# Patient Record
Sex: Female | Born: 1955 | Race: White | Hispanic: No | Smoking: Former smoker
Health system: Southern US, Community
[De-identification: ages and names within clinical notes are randomized; demographics above are authoritative.]

## PROBLEM LIST (undated history)

## (undated) DIAGNOSIS — Z860100 Personal history of colon polyps, unspecified: Secondary | ICD-10-CM

## (undated) DIAGNOSIS — G4730 Sleep apnea, unspecified: Secondary | ICD-10-CM

## (undated) DIAGNOSIS — Z9889 Other specified postprocedural states: Secondary | ICD-10-CM

## (undated) DIAGNOSIS — Z780 Asymptomatic menopausal state: Secondary | ICD-10-CM

## (undated) DIAGNOSIS — R42 Dizziness and giddiness: Secondary | ICD-10-CM

## (undated) DIAGNOSIS — M1990 Unspecified osteoarthritis, unspecified site: Secondary | ICD-10-CM

## (undated) DIAGNOSIS — Z8619 Personal history of other infectious and parasitic diseases: Secondary | ICD-10-CM

## (undated) DIAGNOSIS — C539 Malignant neoplasm of cervix uteri, unspecified: Secondary | ICD-10-CM

## (undated) DIAGNOSIS — I10 Essential (primary) hypertension: Secondary | ICD-10-CM

## (undated) DIAGNOSIS — Z9109 Other allergy status, other than to drugs and biological substances: Secondary | ICD-10-CM

## (undated) DIAGNOSIS — R112 Nausea with vomiting, unspecified: Secondary | ICD-10-CM

## (undated) DIAGNOSIS — I639 Cerebral infarction, unspecified: Secondary | ICD-10-CM

## (undated) DIAGNOSIS — G43909 Migraine, unspecified, not intractable, without status migrainosus: Secondary | ICD-10-CM

## (undated) DIAGNOSIS — K5790 Diverticulosis of intestine, part unspecified, without perforation or abscess without bleeding: Secondary | ICD-10-CM

## (undated) DIAGNOSIS — I671 Cerebral aneurysm, nonruptured: Secondary | ICD-10-CM

## (undated) HISTORY — DX: Unspecified osteoarthritis, unspecified site: M19.90

## (undated) HISTORY — PX: OTHER SURGICAL HISTORY: SHX169

## (undated) HISTORY — PX: KNEE ARTHROSCOPY: SHX127

## (undated) HISTORY — DX: Cerebral infarction, unspecified: I63.9

## (undated) HISTORY — DX: Asymptomatic menopausal state: Z78.0

## (undated) HISTORY — DX: Other allergy status, other than to drugs and biological substances: Z91.09

## (undated) HISTORY — DX: Malignant neoplasm of cervix uteri, unspecified: C53.9

## (undated) HISTORY — DX: Personal history of colonic polyps: Z86.010

## (undated) HISTORY — DX: Essential (primary) hypertension: I10

## (undated) HISTORY — DX: Cerebral aneurysm, nonruptured: I67.1

## (undated) HISTORY — DX: Diverticulosis of intestine, part unspecified, without perforation or abscess without bleeding: K57.90

## (undated) HISTORY — DX: Personal history of colon polyps, unspecified: Z86.0100

---

## 1986-07-09 HISTORY — PX: BREAST BIOPSY: SHX20

## 1991-07-10 HISTORY — PX: BREAST BIOPSY: SHX20

## 2006-05-28 ENCOUNTER — Encounter: Admission: RE | Admit: 2006-05-28 | Discharge: 2006-05-28 | Payer: Self-pay | Admitting: Specialist

## 2008-11-22 ENCOUNTER — Ambulatory Visit: Payer: Self-pay | Admitting: Internal Medicine

## 2008-11-22 DIAGNOSIS — J3089 Other allergic rhinitis: Secondary | ICD-10-CM

## 2008-11-22 DIAGNOSIS — J302 Other seasonal allergic rhinitis: Secondary | ICD-10-CM | POA: Insufficient documentation

## 2009-05-02 ENCOUNTER — Encounter: Admission: RE | Admit: 2009-05-02 | Discharge: 2009-05-02 | Payer: Self-pay | Admitting: Specialist

## 2010-05-05 ENCOUNTER — Encounter: Admission: RE | Admit: 2010-05-05 | Discharge: 2010-05-05 | Payer: Self-pay | Admitting: Specialist

## 2010-07-09 LAB — HM COLONOSCOPY: HM Colonoscopy: NORMAL

## 2010-07-13 ENCOUNTER — Encounter (INDEPENDENT_AMBULATORY_CARE_PROVIDER_SITE_OTHER): Payer: Self-pay | Admitting: *Deleted

## 2010-07-17 ENCOUNTER — Ambulatory Visit
Admission: RE | Admit: 2010-07-17 | Discharge: 2010-07-17 | Payer: Self-pay | Source: Home / Self Care | Attending: Internal Medicine | Admitting: Internal Medicine

## 2010-07-28 ENCOUNTER — Ambulatory Visit
Admission: RE | Admit: 2010-07-28 | Discharge: 2010-07-28 | Payer: Self-pay | Source: Home / Self Care | Attending: Internal Medicine | Admitting: Internal Medicine

## 2010-07-28 ENCOUNTER — Other Ambulatory Visit: Payer: Self-pay | Admitting: Internal Medicine

## 2010-08-03 ENCOUNTER — Encounter: Payer: Self-pay | Admitting: Internal Medicine

## 2010-08-10 NOTE — Letter (Signed)
Summary: Miralax Instructions  [facility]  [address]   [place], [place] [address]   Phone: [phone]  Fax: [phone]       Shannon Yu    July 24, 1955    MRN: [id number]       Procedure Day Dorna Bloom: Friday, 07-28-10     Arrival Time: 9:00 a.m.     Procedure Time: 10:00 a.m.     Location of Procedure:                    x   [facility] (4th Floor)    PREPARATION FOR COLONOSCOPY WITH MIRALAX  Starting 5 days prior to your procedure 07-23-10 do not eat nuts, seeds, popcorn, corn, beans, peas,  salads, or any raw vegetables.  Do not take any fiber supplements (e.g. Metamucil, Citrucel, and Benefiber). ____________________________________________________________________________________________________   THE DAY BEFORE YOUR PROCEDURE         DATE:07-27-10  DAY: Thursday  1   Drink clear liquids the entire day-NO SOLID FOOD  2   Do not drink anything colored red or purple.  Avoid juices with pulp.  No orange juice.  3   Drink at least 64 oz. (8 glasses) of fluid/clear liquids during the day to prevent dehydration and help the prep work efficiently.  CLEAR LIQUIDS INCLUDE: Water Jello Ice Popsicles Tea (sugar ok, no milk/cream) Powdered fruit flavored drinks Coffee (sugar ok, no milk/cream) Gatorade Juice: apple, white grape, white cranberry  Lemonade Clear bullion, consomm, broth Carbonated beverages (any kind) Strained chicken noodle soup Hard Candy  4   Mix the entire bottle of Miralax with 64 oz. of Gatorade/Powerade in the morning and put in the refrigerator to chill.  5   At 3:00 pm take 2 Dulcolax/Bisacodyl tablets.  6   At 4:30 pm take one Reglan/Metoclopramide tablet.  7  Starting at 5:00 pm drink one 8 oz glass of the Miralax mixture every 15-20 minutes until you have finished drinking the entire 64 oz.  You should finish drinking prep around 7:30 or 8:00 pm.  8   If you are nauseated, you may take the 2nd Reglan/Metoclopramide  tablet at 6:30 pm.        9    At 8:00 pm take 2 more DULCOLAX/Bisacodyl tablets.     THE DAY OF YOUR PROCEDURE      DATE:  07-28-10 DAY: Friday  You may drink clear liquids until  8:00 a.m.  (2 HOURS BEFORE PROCEDURE).   MEDICATION INSTRUCTIONS  Unless otherwise instructed, you should take regular prescription medications with a small sip of water as early as possible the morning of your procedure.         OTHER INSTRUCTIONS  You will need a responsible adult at least 54 years of age to accompany you and drive you home.   This person must remain in the waiting room during your procedure.  Wear loose fitting clothing that is easily removed.  Leave jewelry and other valuables at home.  However, you may wish to bring a book to read or an iPod/MP3 player to listen to music as you wait for your procedure to start.  Remove all body piercing jewelry and leave at home.  Total time from sign-in until discharge is approximately 2-3 hours.  You should go home directly after your procedure and rest.  You can resume normal activities the day after your procedure.  The day of your procedure you should not:   Drive  Make legal decisions   Operate machinery   Drink alcohol   Return to work  You will receive specific instructions about eating, activities and medications before you leave.   The above instructions have been reviewed and explained to me by   Ezra Sites RN  July 17, 2010 2:56 PM    I fully understand and can verbalize these instructions _____________________________ Date _______

## 2010-08-10 NOTE — Miscellaneous (Signed)
Summary: [facility]  Clinical Lists Changes  Medications: Added new medication of MIRALAX   POWD (POLYETHYLENE GLYCOL 3350) As per prep  instructions. - Signed Added new medication of DULCOLAX 5 MG  TBEC (BISACODYL) Day before procedure take 2 at 3pm and 2 at 8pm. - Signed Added new medication of REGLAN 10 MG  TABS (METOCLOPRAMIDE HCL) As per prep instructions. - Signed Rx of MIRALAX   POWD (POLYETHYLENE GLYCOL 3350) As per prep  instructions.;  #255gm x 0;  Signed;  Entered by: Ezra Sites RN;  Authorized by: Hart Carwin MD;  Method used: Electronically to [facility]  [address] # [id number]*, [address] [place], [place], [place]  [address], Ph: [phone] or [phone], Fax: [phone] Rx of DULCOLAX 5 MG  TBEC (BISACODYL) Day before procedure take 2 at 3pm and 2 at 8pm.;  #4 x 0;  Signed;  Entered by: Ezra Sites RN;  Authorized by: Hart Carwin MD;  Method used: Electronically to [facility]  [address] # [id number]*, [address] [place], [place], [place]  [address], Ph: [phone] or [phone], Fax: [phone] Rx of REGLAN 10 MG  TABS (METOCLOPRAMIDE HCL) As per prep instructions.;  #2 x 0;  Signed;  Entered by: Ezra Sites RN;  Authorized by: Hart Carwin MD;  Method used: Electronically to [facility]  [address] # [id number]*, [address] [place], [place], [place]  [address], Ph: [phone] or [phone], Fax: [phone]    Prescriptions: REGLAN 10 MG  TABS (METOCLOPRAMIDE HCL) As per prep instructions.  #2 x 0   Entered by:   Ezra Sites RN   Authorized by:   Hart Carwin MD   Signed by:   Ezra Sites RN on 07/17/2010   Method used:   Electronically to        [id number]* (retail)       [address]       [place], [place]  [address]       Ph: [phone] or [phone]       Fax: [phone]   RxID:   [id number] DULCOLAX 5 MG  TBEC (BISACODYL) Day before procedure take 2 at 3pm and 2 at 8pm.  #4 x 0   Entered by:    Ezra Sites RN   Authorized by:   Hart Carwin MD   Signed by:   Ezra Sites RN on 07/17/2010   Method used:   Electronically to        [id number]* (retail)       [address]       [place], [place]  [address]       Ph: [phone] or [phone]       Fax: [phone]   RxID:   [id number] MIRALAX   POWD (POLYETHYLENE GLYCOL 3350) As per prep  instructions.  #255gm x 0   Entered by:   Ezra Sites RN   Authorized by:   Hart Carwin MD   Signed by:   Ezra Sites RN on 07/17/2010   Method used:   Electronically to        [id number]* (retail)       [address]       [place], [place]  [address]  Ph: [phone] or [phone]       Fax: [phone]   RxID:   [id number]

## 2010-08-10 NOTE — Procedures (Addendum)
Summary: Colonoscopy  Patient: [name] Note: All result statuses are Final unless otherwise noted.  Tests: (1) Colonoscopy (COL)   COL Colonoscopy           DONE (C)     [facility]     [address]     [place], [place]  [address]           COLONOSCOPY PROCEDURE REPORT           PATIENT:  Shannon Yu, Shannon Yu  MR#:  [id number]     BIRTHDATE:  04-Nov-1955, 54 yrs. old  GENDER:  female     ENDOSCOPIST:  Hedwig Morton. Juanda Chance, MD     REF. BY:  Dr Arther Abbott     PROCEDURE DATE:  07/28/2010     PROCEDURE:  Colonoscopy [address]     ASA CLASS:  Class I     INDICATIONS:  Routine Risk Screening     MEDICATIONS:   Versed 10 mg, Fentanyl 100 mcg           DESCRIPTION OF PROCEDURE:   After the risks benefits and     alternatives of the procedure were thoroughly explained, informed     consent was obtained.  Digital rectal exam was performed and     revealed no rectal masses.   The LB [id number] endoscope     was introduced through the anus and advanced to the cecum, which     was identified by both the appendix and ileocecal valve, without     limitations.  The quality of the prep was good, using MiraLax.     The instrument was then slowly withdrawn as the colon was fully     examined.     <<PROCEDUREIMAGES>>           FINDINGS:  A sessile polyp was found in the ascending colon. at 85     cm 4 mm sessile polyp The polyp was removed using cold biopsy     forceps (see image6).  Mild diverticulosis was found in the     ascending colon (see image2).  Otherwise normal colonoscopy     without other polyps, masses, vascular ectasias, or inflammatory     changes (see image3, image4, and image5).   Retroflexed views in     the rectum revealed no abnormalities.    The scope was then     withdrawn from the patient and the procedure completed.           COMPLICATIONS:  None     ENDOSCOPIC IMPRESSION:     1) Sessile polyp in the ascending colon     2) Mild diverticulosis in the ascending  colon     RECOMMENDATIONS:     1) Await pathology results     2) high fiber diet     REPEAT EXAM:  In 10 year(s) for.           ______________________________     Hedwig Morton. Juanda Chance, MD           CC:           n.     REVISED:  07/28/2010 10:45 AM     eSIGNED:   Hedwig Morton. Imaya Duffy at 07/28/2010 10:45 AM           Lodema Pilot, [id number]  Note: An exclamation mark (!) indicates a result that was not dispersed into the flowsheet. Document Creation Date: 07/28/2010 10:46 AM _______________________________________________________________________  (1) Order result status:  Final Collection or observation date-time: 07/28/2010 10:26 Requested date-time:  Receipt date-time:  Reported date-time:  Referring Physician:   Ordering Physician: Lina Sar [phone]) Specimen Source:  Source: Launa Grill Order Number: [id number] Lab site:   Appended Document: Colonoscopy     Procedures Next Due Date:    Colonoscopy: 08/2020

## 2010-08-10 NOTE — Letter (Addendum)
Summary: Patient Notice- Polyp Results  [facility]  [address] [place], [place] [address]   Phone: [phone]  Fax: [phone]        August 03, 2010 MRN: [id number]    Shannon Yu [address] [place], [place]  [address]    Dear 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  Should you develop new or worsening symptoms of abdominal pain, bowel habit changes or bleeding from the rectum or bowels, please schedule an evaluation with either your primary care physician or with me.  Additional information/recommendations:  _x_ No further action with gastroenterology is needed at this time. Please      follow-up with your primary care physician for your other healthcare      needs.  __ Please call [phone] to schedule a return visit to review your      situation.  __ Please keep your follow-up visit as already scheduled.  __ Continue treatment plan as outlined the day of your exam.  Please call us if you are having persistent problems or have questions about your condition that have not been fully answered at this time.  Sincerely,  Hart Carwin MD  This letter has been electronically signed by your physician.  Appended Document: Patient Notice- Polyp Results LETTER MAILED

## 2011-04-13 ENCOUNTER — Other Ambulatory Visit: Payer: Self-pay | Admitting: Specialist

## 2011-04-13 DIAGNOSIS — Z1231 Encounter for screening mammogram for malignant neoplasm of breast: Secondary | ICD-10-CM

## 2011-05-07 ENCOUNTER — Ambulatory Visit
Admission: RE | Admit: 2011-05-07 | Discharge: 2011-05-07 | Disposition: A | Discharge status: Home / Self Care | Payer: 59 | Source: Ambulatory Visit | Attending: Specialist | Admitting: Specialist

## 2011-05-07 DIAGNOSIS — Z1231 Encounter for screening mammogram for malignant neoplasm of breast: Secondary | ICD-10-CM

## 2011-06-26 ENCOUNTER — Encounter: Payer: Self-pay | Admitting: Internal Medicine

## 2011-06-26 ENCOUNTER — Ambulatory Visit (INDEPENDENT_AMBULATORY_CARE_PROVIDER_SITE_OTHER): Payer: 59 | Admitting: Internal Medicine

## 2011-06-26 VITALS — BP 136/86 | HR 76 | Ht 69.0 in | Wt 172.4 lb

## 2011-06-26 DIAGNOSIS — G4733 Obstructive sleep apnea (adult) (pediatric): Secondary | ICD-10-CM

## 2011-06-26 DIAGNOSIS — J309 Allergic rhinitis, unspecified: Secondary | ICD-10-CM

## 2011-06-26 NOTE — Progress Notes (Signed)
06/26/11- 55 yoFnever smoker seeking evaluation for sleep apnea. Boyfriend describes loud snore and witnessed apnea. She feels sleep quality is poor and unrestful since menopause at age 55. Tylenol PM helps her fall asleep. She had been evaluated here in the past for allergic rhinitis. Nasal congestion contributes to her snoring. She avoids naps. One cup of coffee in the morning only. Bedtime between 8 and 10 PM based on when she plays tennis. Short sleep latency, waking once before up between 5:30 and 5:45 AM. Weight is up about 8 pounds in the last 2 years. No ENT surgery. No history of cardiopulmonary disease. Brother uses CPAP for sleep apnea.  ROS-see HPI \\Constitutional :   No-   weight loss, night sweats, fevers, chills, fatigue, lassitude. HEENT:   No-  headaches, difficulty swallowing, tooth/dental problems, sore throat,       Some  sneezing, itching, ear ache, nasal congestion, post nasal drip,  CV:  No-   chest pain, orthopnea, PND, swelling in lower extremities, anasarca, dizziness, palpitations Resp: No-   shortness of breath with exertion or at rest.              No-   productive cough,  No non-productive cough,  No- coughing up of blood.              No-   change in color of mucus.  No- wheezing.   Skin: No-   rash or lesions. GI:  No-   heartburn, indigestion, abdominal pain, nausea, vomiting, diarrhea,                 change in bowel habits, loss of appetite GU: No-   dysuria, change in color of urine, no urgency or frequency.  No- flank pain. MS:  No-   joint pain or swelling.  No- decreased range of motion.  No- back pain. Neuro-     nothing unusual Psych:  No- change in mood or affect. No depression or anxiety.  No memory loss.  OBJ General- Alert, Oriented, Affect-appropriate, Distress- none acute, tall Skin- rash-none, lesions- none, excoriation- none Lymphadenopathy- none Head- atraumatic            Eyes- Gross vision intact, PERRLA, conjunctivae clear secretions       Ears- Hearing, canals-normal            Nose- Clear, no-Septal dev, mucus, polyps, erosion, perforation             Throat- Mallampati II-III , mucosa clear , drainage- none, tonsils- atrophic Neck- flexible , trachea midline, no stridor , thyroid nl, carotid no bruit Chest - symmetrical excursion , unlabored           Heart/CV- RRR , no murmur , no gallop  , no rub, nl s1 s2                           - JVD- none , edema- none, stasis changes- none, varices- none           Lung- clear to P&A, wheeze- none, cough- none , dullness-none, rub- none           Chest wall-  Abd- tender-no, distended-no, bowel sounds-present, HSM- no Br/ Gen/ Rectal- Not done, not indicated Extrem- cyanosis- none, clubbing, none, atrophy- none, strength- nl Neuro- grossly intact to observation

## 2011-06-26 NOTE — Patient Instructions (Signed)
Order- split protocol NPSG   Dx OSA 

## 2011-06-30 DIAGNOSIS — G4733 Obstructive sleep apnea (adult) (pediatric): Secondary | ICD-10-CM | POA: Insufficient documentation

## 2011-06-30 NOTE — Assessment & Plan Note (Signed)
We discussed the medical concerns and diagnostic evaluation. Plan-schedule sleep study.

## 2011-06-30 NOTE — Assessment & Plan Note (Signed)
She is managing with loratadine now, but may need additional help.

## 2011-07-06 ENCOUNTER — Ambulatory Visit: Payer: 59 | Attending: Internal Medicine | Admitting: Sleep Medicine

## 2011-07-06 VITALS — HR 79 | Resp 16 | Ht 69.0 in | Wt 172.0 lb

## 2011-07-06 DIAGNOSIS — G4733 Obstructive sleep apnea (adult) (pediatric): Secondary | ICD-10-CM

## 2011-07-15 NOTE — Procedures (Signed)
NAME:  Shannon Yu, MRUK                 ACCOUNT NO.:  [id number]  MEDICAL RECORD NO.:  [id number]          PATIENT TYPE:  OUT  LOCATION:  SLEEP CENTER                 FACILITY[facility]  [facility]  PHYSICIAN:  Amrit Erck D. Maple Hudson, MD, FCCP, FACPDATE OF BIRTH:  03-19-57  DATE OF STUDY:  07/06/2011                           NOCTURNAL POLYSOMNOGRAM  REFERRING PHYSICIAN:  Allysson Rinehimer D. Maple Hudson, MD, FCCP, FACP  REFERRING PHYSICIAN:  Lillion Elbert D. Naoko Diperna, MD, FCCP, FACP  INDICATION FOR STUDY:  Hypersomnia with sleep apnea.  EPWORTH SLEEPINESS SCORE:  11/24.  BMI 25, weight 172 pounds, height 69 inches, neck 15 inches.  MEDICATIONS:  Home medications are charted and reviewed.  SLEEP ARCHITECTURE:  Total sleep time 383 minutes with sleep efficiency 90.8%.  Stage I was 5.4%, stage II 66.6%, stage III 11.9%, REM 16.2% of total sleep time, sleep latency 15 minutes, REM latency 136.5 minutes, awake after sleep onset 24 minutes, arousal index 10.8.  BEDTIME MEDICATION:  Tylenol PM.  RESPIRATORY DATA:  Apnea-hypopnea index (AHI) 7.8 per hour.  A total of 50 events was scored including 30 obstructive apneas, 1 mixed apnea, 19 hypopneas.  Events were not positional.  More common in REM with REM AHI 33.9 per hour.  There were insufficient numbers of early events to qualify for split protocol CPAP titration on this study night.  OXYGEN DATA:  Very loud snoring with oxygen desaturation to a nadir of 83% and the mean oxygen saturation through the study of 94.4% on room air.  CARDIAC DATA:  Sinus rhythm.  MOVEMENT-PARASOMNIA:  Frequent limb jerks with a total of 104 limb jerks recorded of which 10 were associated with arousals or awakening for periodic limb movement with arousal index of 1.6 per hour.  No bathroom trips.  Technician commented that the patient at times seem to very restless with frequent body position changes.  IMPRESSIONS-RECOMMENDATIONS: 1. Mild obstructive sleep apnea/hypopnea syndrome, AHI 7.8 per  hour     with non-positional events, more common in REM.  Moderate-to-very     loud snoring with oxygen desaturation to a nadir of 83% and mean     oxygen saturation through the study of 94.4% on room air. 2. There were insufficient numbers of early events to qualify for     split protocol CPAP titration on this study night.  Consider return     for CPAP titration or evaluate for alternative management as     clinically appropriate. 3. Periodic limb movement with arousal.  Limb jerks were relatively     frequent with a total of 104 counted, of which 10 were associated     with arousal or awakening for periodic limb movement with arousal     index of 1.6 per hour.     Gabriela Giannelli D. Maple Hudson, MD, [facility], FACP Diplomate, Biomedical engineer of Sleep Medicine Electronically Signed    CDY/MEDQ  D:  07/14/2011 13:33:24  T:  07/15/2011 02:37:22  Job:  [id number]

## 2011-07-20 ENCOUNTER — Encounter (HOSPITAL_BASED_OUTPATIENT_CLINIC_OR_DEPARTMENT_OTHER): Payer: 59

## 2011-07-25 ENCOUNTER — Encounter: Payer: Self-pay | Admitting: Internal Medicine

## 2011-07-25 ENCOUNTER — Ambulatory Visit (INDEPENDENT_AMBULATORY_CARE_PROVIDER_SITE_OTHER): Payer: 59 | Admitting: Internal Medicine

## 2011-07-25 VITALS — BP 124/84 | HR 87 | Ht 69.0 in | Wt 173.0 lb

## 2011-07-25 DIAGNOSIS — J309 Allergic rhinitis, unspecified: Secondary | ICD-10-CM

## 2011-07-25 DIAGNOSIS — G4733 Obstructive sleep apnea (adult) (pediatric): Secondary | ICD-10-CM

## 2011-07-25 NOTE — Patient Instructions (Signed)
Please call as needed 

## 2011-07-25 NOTE — Progress Notes (Signed)
06/26/11- 55 yoFnever smoker seeking evaluation for sleep apnea. Boyfriend describes loud snore and witnessed apnea. She feels sleep quality is poor and unrestful since menopause at age 56. Tylenol PM helps her fall asleep. She had been evaluated here in the past for allergic rhinitis. Nasal congestion contributes to her snoring. She avoids naps. One cup of coffee in the morning only. Bedtime between 8 and 10 PM based on when she plays tennis. Short sleep latency, waking once before up between 5:30 and 5:45 AM. Weight is up about 8 pounds in the last 2 years. No ENT surgery. No history of cardiopulmonary disease. Brother uses CPAP for sleep apnea.  07/25/11-  55 yoFnever smoker followed for OSA, allergic rhinitis NPSG 07/06/11- Mild OSA AHI 7.8/ hr, nonpositional, loud snoring, desat to 83%.   we discussed obstructive sleep apnea in this range , medical significance and spectrum of available treatments. She is not much concerned and that is appropriate. Medical impact is unlikely to be much as long as she doesn't gain weight. We agreed not to treat at this time.  ROS-see HPI \\Constitutional :   No-   weight loss, night sweats, fevers, chills, fatigue, lassitude. HEENT:   No-  headaches, difficulty swallowing, tooth/dental problems, sore throat,       Some  sneezing, itching, ear ache, nasal congestion, post nasal drip,  CV:  No-   chest pain, orthopnea, PND, swelling in lower extremities, anasarca, dizziness, palpitations Resp: No-   shortness of breath with exertion or at rest.              No-   productive cough,  No non-productive cough,  No- coughing up of blood.              No-   change in color of mucus.  No- wheezing.   Skin: No-   rash or lesions. GI:  No-   heartburn, indigestion, abdominal pain, nausea, vomiting, diarrhea,                 change in bowel habits, loss of appetite GU: MS:  No-   joint pain or swelling.  No- decreased range of motion.  No- back pain. Neuro-     nothing  unusual Psych:  No- change in mood or affect. No depression or anxiety.  No memory loss.  OBJ General- Alert, Oriented, Affect-appropriate, Distress- none acute, tall Skin- rash-none, lesions- none, excoriation- none Lymphadenopathy- none Head- atraumatic            Eyes- Gross vision intact, PERRLA, conjunctivae clear secretions            Ears- Hearing, canals-normal            Nose- Clear, no-Septal dev, mucus, polyps, erosion, perforation             Throat- Mallampati II-III , mucosa clear , drainage- none, tonsils- atrophic Neck- flexible , trachea midline, no stridor , thyroid nl, carotid no bruit Chest - symmetrical excursion , unlabored           Heart/CV- RRR , no murmur , no gallop  , no rub, nl s1 s2                           - JVD- none , edema- none, stasis changes- none, varices- none           Lung- clear to P&A, wheeze- none, cough- none , dullness-none, rub- none  Chest wall-  Abd- Br/ Gen/ Rectal- Not done, not indicated Extrem- cyanosis- none, clubbing, none, atrophy- none, strength- nl Neuro- grossly intact to observation

## 2011-07-27 ENCOUNTER — Ambulatory Visit: Payer: 59 | Admitting: Internal Medicine

## 2011-07-28 ENCOUNTER — Encounter: Payer: Self-pay | Admitting: Internal Medicine

## 2011-07-28 NOTE — Assessment & Plan Note (Signed)
We suggested only conservative treatment , watch weight, try sleeping off flat of back.

## 2011-07-28 NOTE — Assessment & Plan Note (Signed)
Asymptomatic now, pending anticipated early pollen season.

## 2012-03-31 ENCOUNTER — Other Ambulatory Visit: Payer: Self-pay | Admitting: Specialist

## 2012-03-31 DIAGNOSIS — Z1231 Encounter for screening mammogram for malignant neoplasm of breast: Secondary | ICD-10-CM

## 2012-05-07 ENCOUNTER — Ambulatory Visit
Admission: RE | Admit: 2012-05-07 | Discharge: 2012-05-07 | Disposition: A | Discharge status: Home / Self Care | Payer: Commercial Managed Care - PPO | Source: Ambulatory Visit | Attending: Specialist | Admitting: Specialist

## 2012-05-07 ENCOUNTER — Ambulatory Visit: Payer: Commercial Managed Care - PPO

## 2012-05-07 DIAGNOSIS — Z1231 Encounter for screening mammogram for malignant neoplasm of breast: Secondary | ICD-10-CM

## 2012-05-09 ENCOUNTER — Encounter (HOSPITAL_COMMUNITY): Payer: Self-pay | Admitting: Anesthesiology

## 2012-05-09 ENCOUNTER — Inpatient Hospital Stay (HOSPITAL_COMMUNITY): Payer: Commercial Managed Care - PPO | Admitting: Anesthesiology

## 2012-05-09 ENCOUNTER — Encounter (HOSPITAL_COMMUNITY): Payer: Self-pay

## 2012-05-09 ENCOUNTER — Inpatient Hospital Stay (HOSPITAL_COMMUNITY): Payer: Commercial Managed Care - PPO

## 2012-05-09 ENCOUNTER — Emergency Department (HOSPITAL_COMMUNITY): Payer: Commercial Managed Care - PPO

## 2012-05-09 ENCOUNTER — Inpatient Hospital Stay (HOSPITAL_COMMUNITY)
Admission: EM | Admit: 2012-05-09 | Discharge: 2012-05-27 | DRG: 020 | Disposition: A | Discharge status: Home / Self Care | Payer: Commercial Managed Care - PPO | Attending: Neurosurgery | Admitting: Neurosurgery

## 2012-05-09 DIAGNOSIS — G4733 Obstructive sleep apnea (adult) (pediatric): Secondary | ICD-10-CM

## 2012-05-09 DIAGNOSIS — K5900 Constipation, unspecified: Secondary | ICD-10-CM | POA: Diagnosis not present

## 2012-05-09 DIAGNOSIS — J95821 Acute postprocedural respiratory failure: Secondary | ICD-10-CM | POA: Diagnosis not present

## 2012-05-09 DIAGNOSIS — R7309 Other abnormal glucose: Secondary | ICD-10-CM | POA: Diagnosis not present

## 2012-05-09 DIAGNOSIS — R413 Other amnesia: Secondary | ICD-10-CM | POA: Diagnosis not present

## 2012-05-09 DIAGNOSIS — G43909 Migraine, unspecified, not intractable, without status migrainosus: Secondary | ICD-10-CM | POA: Diagnosis present

## 2012-05-09 DIAGNOSIS — I10 Essential (primary) hypertension: Secondary | ICD-10-CM | POA: Diagnosis present

## 2012-05-09 DIAGNOSIS — E876 Hypokalemia: Secondary | ICD-10-CM | POA: Diagnosis not present

## 2012-05-09 DIAGNOSIS — I609 Nontraumatic subarachnoid hemorrhage, unspecified: Principal | ICD-10-CM

## 2012-05-09 DIAGNOSIS — I671 Cerebral aneurysm, nonruptured: Secondary | ICD-10-CM

## 2012-05-09 DIAGNOSIS — Z87891 Personal history of nicotine dependence: Secondary | ICD-10-CM

## 2012-05-09 DIAGNOSIS — I67848 Other cerebrovascular vasospasm and vasoconstriction: Secondary | ICD-10-CM

## 2012-05-09 DIAGNOSIS — R1110 Vomiting, unspecified: Secondary | ICD-10-CM | POA: Diagnosis not present

## 2012-05-09 DIAGNOSIS — J309 Allergic rhinitis, unspecified: Secondary | ICD-10-CM | POA: Diagnosis present

## 2012-05-09 DIAGNOSIS — G458 Other transient cerebral ischemic attacks and related syndromes: Secondary | ICD-10-CM | POA: Diagnosis present

## 2012-05-09 DIAGNOSIS — J9601 Acute respiratory failure with hypoxia: Secondary | ICD-10-CM

## 2012-05-09 HISTORY — DX: Cerebral aneurysm, nonruptured: I67.1

## 2012-05-09 HISTORY — PX: ANEURYSM COILING: SHX5349

## 2012-05-09 HISTORY — DX: Migraine, unspecified, not intractable, without status migrainosus: G43.909

## 2012-05-09 LAB — CBC WITH DIFFERENTIAL/PLATELET
Eosinophils Absolute: 0 10*3/uL (ref 0.0–0.7)
Eosinophils Relative: 0 % (ref 0–5)
Hemoglobin: 13.3 g/dL (ref 12.0–15.0)
Lymphs Abs: 0.9 10*3/uL (ref 0.7–4.0)
MCH: 32.4 pg (ref 26.0–34.0)
MCHC: 34.2 g/dL (ref 30.0–36.0)
MCV: 94.6 fL (ref 78.0–100.0)
Monocytes Absolute: 0.3 10*3/uL (ref 0.1–1.0)
Monocytes Relative: 3 % (ref 3–12)
RBC: 4.11 MIL/uL (ref 3.87–5.11)

## 2012-05-09 LAB — BASIC METABOLIC PANEL
BUN: 8 mg/dL (ref 6–23)
CO2: 23 mEq/L (ref 19–32)
Glucose, Bld: 106 mg/dL — ABNORMAL HIGH (ref 70–99)
Potassium: 3.6 mEq/L (ref 3.5–5.1)
Sodium: 139 mEq/L (ref 135–145)

## 2012-05-09 LAB — MRSA PCR SCREENING: MRSA by PCR: NEGATIVE

## 2012-05-09 MED ORDER — DIPHENHYDRAMINE HCL 50 MG/ML IJ SOLN
12.5000 mg | Freq: Once | INTRAMUSCULAR | Status: AC
  Administered 2012-05-09: 12.5 mg via INTRAVENOUS
  Filled 2012-05-09: qty 1

## 2012-05-09 MED ORDER — SUMATRIPTAN SUCCINATE 6 MG/0.5ML ~~LOC~~ SOLN
6.0000 mg | Freq: Once | SUBCUTANEOUS | Status: AC
  Administered 2012-05-09: 6 mg via SUBCUTANEOUS
  Filled 2012-05-09: qty 0.5

## 2012-05-09 MED ORDER — SODIUM CHLORIDE 0.9 % IV SOLN
INTRAVENOUS | Status: DC
  Administered 2012-05-09: 13:00:00 via INTRAVENOUS

## 2012-05-09 MED ORDER — MANNITOL 20 % IV SOLN
Freq: Once | INTRAVENOUS | Status: DC
  Filled 2012-05-09: qty 500

## 2012-05-09 MED ORDER — NIMODIPINE 30 MG PO CAPS
60.0000 mg | ORAL_CAPSULE | ORAL | Status: DC
  Filled 2012-05-09 (×10): qty 2

## 2012-05-09 MED ORDER — ACETAMINOPHEN 650 MG RE SUPP: 650.0000 mg | RECTAL | Status: DC | PRN

## 2012-05-09 MED ORDER — METOCLOPRAMIDE HCL 5 MG/ML IJ SOLN
10.0000 mg | Freq: Once | INTRAMUSCULAR | Status: AC
  Administered 2012-05-09: 10 mg via INTRAVENOUS
  Filled 2012-05-09: qty 2

## 2012-05-09 MED ORDER — FENTANYL CITRATE 0.05 MG/ML IJ SOLN
INTRAMUSCULAR | Status: DC | PRN
  Administered 2012-05-09: 25 ug via INTRAVENOUS

## 2012-05-09 MED ORDER — SENNOSIDES-DOCUSATE SODIUM 8.6-50 MG PO TABS
1.0000 | ORAL_TABLET | Freq: Two times a day (BID) | ORAL | Status: DC
  Administered 2012-05-10 – 2012-05-26 (×31): 1 via ORAL
  Filled 2012-05-09 (×37): qty 1

## 2012-05-09 MED ORDER — KETOROLAC TROMETHAMINE 30 MG/ML IJ SOLN
30.0000 mg | Freq: Once | INTRAMUSCULAR | Status: AC
  Administered 2012-05-09: 30 mg via INTRAVENOUS
  Filled 2012-05-09: qty 1

## 2012-05-09 MED ORDER — FENTANYL CITRATE 0.05 MG/ML IJ SOLN
INTRAMUSCULAR | Status: AC
  Filled 2012-05-09: qty 4

## 2012-05-09 MED ORDER — MIDAZOLAM HCL 2 MG/2ML IJ SOLN
INTRAMUSCULAR | Status: AC
  Filled 2012-05-09: qty 4

## 2012-05-09 MED ORDER — MIDAZOLAM HCL 2 MG/2ML IJ SOLN
INTRAMUSCULAR | Status: DC | PRN
  Administered 2012-05-09: 1 mg via INTRAVENOUS

## 2012-05-09 MED ORDER — ONDANSETRON HCL 4 MG/2ML IJ SOLN
4.0000 mg | Freq: Four times a day (QID) | INTRAMUSCULAR | Status: DC | PRN
  Administered 2012-05-09: 4 mg via INTRAVENOUS
  Filled 2012-05-09: qty 2

## 2012-05-09 MED ORDER — VECURONIUM BROMIDE 10 MG IV SOLR
INTRAVENOUS | Status: DC | PRN
  Administered 2012-05-09: 6 mg via INTRAVENOUS
  Administered 2012-05-10 (×2): 5 mg via INTRAVENOUS
  Administered 2012-05-10: 4 mg via INTRAVENOUS

## 2012-05-09 MED ORDER — NIMODIPINE 30 MG/ML ORAL SOLUTION
60.0000 mg | ORAL | Status: DC
  Filled 2012-05-09 (×4): qty 2

## 2012-05-09 MED ORDER — NIMODIPINE 60 MG/20ML PO SOLN
60.0000 mg | ORAL | Status: DC
  Administered 2012-05-10 (×3): 60 mg
  Filled 2012-05-09 (×10): qty 20

## 2012-05-09 MED ORDER — LABETALOL HCL 5 MG/ML IV SOLN: 10.0000 mg | INTRAVENOUS | Status: DC | PRN

## 2012-05-09 MED ORDER — MORPHINE SULFATE 4 MG/ML IJ SOLN
4.0000 mg | INTRAMUSCULAR | Status: DC | PRN
  Administered 2012-05-10 – 2012-05-11 (×3): 4 mg via INTRAVENOUS
  Filled 2012-05-09 (×3): qty 1

## 2012-05-09 MED ORDER — SODIUM CHLORIDE 0.9 % IV SOLN: INTRAVENOUS | Status: DC

## 2012-05-09 MED ORDER — FENTANYL CITRATE 0.05 MG/ML IJ SOLN
INTRAMUSCULAR | Status: DC | PRN
  Administered 2012-05-09: 100 ug via INTRAVENOUS
  Administered 2012-05-09: 150 ug via INTRAVENOUS

## 2012-05-09 MED ORDER — ACETAMINOPHEN 325 MG PO TABS: 650.0000 mg | ORAL_TABLET | ORAL | Status: DC | PRN

## 2012-05-09 MED ORDER — NITROGLYCERIN 5 MG/ML IV SOLN
1.5000 mg | INTRAVENOUS | Status: AC
  Filled 2012-05-09: qty 0.3

## 2012-05-09 MED ORDER — PANTOPRAZOLE SODIUM 40 MG IV SOLR
40.0000 mg | Freq: Every day | INTRAVENOUS | Status: DC
  Administered 2012-05-09 – 2012-05-10 (×2): 40 mg via INTRAVENOUS
  Filled 2012-05-09 (×3): qty 40

## 2012-05-09 MED ORDER — IOHEXOL 350 MG/ML SOLN
50.0000 mL | Freq: Once | INTRAVENOUS | Status: AC | PRN
  Administered 2012-05-09: 50 mL via INTRAVENOUS

## 2012-05-09 MED ORDER — LACTATED RINGERS IV SOLN
INTRAVENOUS | Status: DC | PRN
  Administered 2012-05-09 – 2012-05-10 (×2): via INTRAVENOUS

## 2012-05-09 NOTE — ED Notes (Signed)
Patient transported to CT 

## 2012-05-09 NOTE — ED Notes (Signed)
Notified anesthesia

## 2012-05-09 NOTE — ED Notes (Signed)
MD at bedside. Dr Eliseo Gum at the bedside discussing POC

## 2012-05-09 NOTE — Preoperative (Signed)
Beta Blockers   Reason not to administer Beta Blockers:Not Applicable. No home beta blockers 

## 2012-05-09 NOTE — ED Notes (Signed)
Headache began at 0950, pt with hx of migraines but states not the same, sudden onset, mild visual disturbances, some diaphoresis, nausea, mild dizziness, neuro exam negative, has increased stress in life

## 2012-05-09 NOTE — ED Notes (Signed)
Cerebral angiogram completed.

## 2012-05-09 NOTE — ED Provider Notes (Signed)
History     CSN: [id number]  Arrival date & time 05/09/12  1110   First MD Initiated Contact with Patient 05/09/12 1222      Chief Complaint  Patient presents with  . Headache    HPI Headache began at 0950, pt with hx of migraines but states not the same, sudden onset, mild visual disturbances, some diaphoresis, nausea, mild dizziness, neuro exam negative, has increased stress in life  Past Medical History  Diagnosis Date  . Menopause   . Environmental allergies   . Migraine     Past Surgical History  Procedure Date  . Knee arthroscopy     Left    Family History  Problem Relation Age of Onset  . Emphysema Father     smoker  . Lung cancer Father   . Prostate cancer Father   . Kidney disease Father     History  Substance Use Topics  . Smoking status: Former Smoker    Types: Cigarettes  . Smokeless tobacco: Not on file  . Alcohol Use: Yes     1-2 drinks 3-5 times a week    OB History    Grav Para Term Preterm Abortions TAB SAB Ect Mult Living                  Review of Systems All other systems reviewed and are negative Allergies  Review of patient's allergies indicates no known allergies.  Home Medications   No current outpatient prescriptions on file.  BP 116/72  Pulse 72  Temp 97.7 F (36.5 C) (Oral)  Resp 22  Ht 5\' 9"  (1.753 m)  Wt 171 lb 15.3 oz (78 kg)  BMI 25.39 kg/m2  SpO2 97%  Physical Exam  Nursing note and vitals reviewed. Constitutional: She is oriented to person, place, and time. She appears well-developed and well-nourished. No distress.  HENT:  Head: Normocephalic and atraumatic.  Eyes: Pupils are equal, round, and reactive to light.  Neck: No rigidity. Decreased range of motion present.  Cardiovascular: Normal rate and intact distal pulses.   Pulmonary/Chest: No respiratory distress.  Abdominal: Normal appearance. She exhibits no distension.  Neurological: She is alert and oriented to person, place, and time. She has  normal strength. No cranial nerve deficit or sensory deficit. GCS eye subscore is 4. GCS verbal subscore is 5. GCS motor subscore is 6.  Skin: Skin is warm and dry. No rash noted.  Psychiatric: She has a normal mood and affect. Her behavior is normal.    ED Course  Procedures (including critical care time)    CRITICAL CARE Performed by: Nelva Nay L   Total critical care time: 30 min   Critical care time was exclusive of separately billable procedures and treating other patients.  Critical care was necessary to treat or prevent imminent or life-threatening deterioration.  Critical care was time spent personally by me on the following activities: development of treatment plan with patient and/or surrogate as well as nursing, discussions with consultants, evaluation of patient's response to treatment, examination of patient, obtaining history from patient or surrogate, ordering and performing treatments and interventions, ordering and review of laboratory studies, ordering and review of radiographic studies, pulse oximetry and re-evaluation of patient's condition.   Labs Reviewed  BASIC METABOLIC PANEL - Abnormal; Notable for the following:    Glucose, Bld 106 (*)     All other components within normal limits  CBC WITH DIFFERENTIAL - Abnormal; Notable for the following:    Neutrophils Relative  89 (*)     Neutro Abs 8.7 (*)     Lymphocytes Relative 9 (*)     All other components within normal limits  CBC WITH DIFFERENTIAL - Abnormal; Notable for the following:    WBC 12.8 (*)     RBC 3.78 (*)     HCT 35.6 (*)     Neutrophils Relative 85 (*)     Neutro Abs 10.9 (*)     Lymphocytes Relative 10 (*)     All other components within normal limits  BASIC METABOLIC PANEL - Abnormal; Notable for the following:    Potassium 3.2 (*)     Glucose, Bld 180 (*)     Calcium 8.3 (*)     All other components within normal limits  BLOOD GAS, ARTERIAL - Abnormal; Notable for the following:     pO2, Arterial 169.0 (*)     Bicarbonate 19.5 (*)     Acid-base deficit 4.9 (*)     Allens test (pass/fail) A-LINE (*)     All other components within normal limits  PROTIME-INR  MRSA PCR SCREENING   Ct Angio Head W/cm &/or Wo Cm  05/09/2012  *RADIOLOGY REPORT*  Clinical Data:  Subarachnoid hemorrhage beginning this morning. Nausea with dizziness.  CT ANGIOGRAPHY HEAD  Technique:  Multidetector CT imaging of the head was performed using the standard protocol during bolus administration of intravenous contrast.  Multiplanar CT image reconstructions including MIPs were obtained to evaluate the vascular anatomy.  Contrast: 50mL OMNIPAQUE IOHEXOL 350 MG/ML SOLN  Comparison:  CT head earlier in the day.  Findings:  Initial images had to be repeated because of mistiming of the bolus.  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  Ventricular enlargement appears to be slightly worse when compared to scan approximately 6 hours earlier.  No new subarachnoid hemorrhage is seen.  There is good opacification of the cerebral vasculature.  No anterior circulation aneurysm is seen.   Projecting superiorly from the basilar tip is a  4 x 4 x 7 mm aneurysm with slight irregularity at its distal tip.  The neck of the aneurysm measures just under 4 mm wide.  Subarachnoid blood within the suprasellar cistern and in the third ventricle is suggestive of rupture from this aneurysm.  There is no visible vasospasm.  Due to the presence of diffuse subarachnoid hemorrhage, definite exclusion of other aneurysms which might be unruptured is difficult, but none are seen.   Formal catheter angiogram with possible neurointervention based on clinical condition and aneurysm morphology are likely the next best steps.   Review of the MIP images confirms the above findings.  IMPRESSION: Diffuse subarachnoid hemorrhage with slight worsening of communicating hydrocephalus.  No new hemorrhage from earlier in the  day.  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 See comments above.  Findings discussed with Dr. Jeral Fruit shortly after completion of the study.   Original Report Authenticated By: Davonna Belling, M.D.    Ct Head Wo Contrast  05/10/2012  *RADIOLOGY REPORT*  Clinical Data: Post aneurysm coiling.  CT HEAD WITHOUT CONTRAST  Technique:  Contiguous axial images were obtained from the base of the skull through the vertex without contrast.  Comparison: 05/09/2012  Findings: Diffuse increased density throughout the subarachnoid spaces as seen previously and consistent with diffuse subarachnoid hemorrhage.  Since the previous study, there is increased hemorrhage layering in the posterior horns of the lateral ventricles.  Interval placement of metallic coils, likely  at the basilar tip.  Mild ventricular dilatation similar to previous study. Suggestion of developing focus of low attenuation in the left posterior parietal region which may represent developing infarct.  No new hemorrhage.  No mass effect or midline shift. Gray-white matter junctions are distinct.  IMPRESSION: Interval coiling of basilar tip aneurysm.  Persistent diffuse subarachnoid hemorrhage with mild increase of intraventricular hemorrhage.  Persistent ventricular dilatation.  Suggestion of developing focal low attenuation in the left posterior parietal region which could represent developing infarct.   Original Report Authenticated By: Burman Nieves, M.D.    Ct Head Wo Contrast  05/09/2012  *RADIOLOGY REPORT*  Clinical Data: Sudden onset headache.  History migraines.  CT HEAD WITHOUT CONTRAST  Technique:  Contiguous axial images were obtained from the base of the skull through the vertex without contrast.  Comparison: None.  Findings: Bone windows demonstrate low density within the soft tissues of the right temporal region is possibly iatrogenic and related to on the placement. No significant soft tissue swelling. Clear paranasal  sinuses and mastoid air cells.  Soft tissue windows demonstrate moderate volume diffuse subarachnoid hemorrhage, including within the basal cisterns and sylvian fissures bilaterally.  Small volume intraventricular hemorrhage layering in the left occipital horn and likely in the right occipital horn.  Development of mild hydrocephalus.  The fourth ventricle is normal in caliber.  There is  a small amount of hemorrhage at the skull base.  No complicating ischemia.  No localizing source.  IMPRESSION:  1.  Diffuse subarachnoid hemorrhage, in a pattern most consistent with aneurysmal bleed. 2.  Intraventricular extension, with early hydrocephalus.  Critical test results telephoned to Dr. Radford Pax at the time of interpretation at 2:40 p.m. on 05/09/2012.   Original Report Authenticated By: Jeronimo Greaves, M.D.    Dg Chest Port 1 View  05/10/2012  *RADIOLOGY REPORT*  Clinical Data: Endotracheal tube placement.  PORTABLE CHEST - 1 VIEW  Comparison: None.  Findings: Endotracheal tube placed with tip about 5 cm above the carina.  Enteric tube is in place with tip not visible but appears to be below the left hemidiaphragm.  Shallow inspiration with elevation of the right hemidiaphragm.  No focal airspace consolidation in the lungs.  Left costophrenic angle is not included within the field of view.  No right costophrenic angle blunting.  No pneumothorax.  Mediastinal contours appear intact.  IMPRESSION: Endotracheal tube placed with tip about 5 cm above the carina.  No evidence of active pulmonary disease.   Original Report Authenticated By: Burman Nieves, M.D.    Dg Abd Portable 1v  05/10/2012  *RADIOLOGY REPORT*  Clinical Data: Evaluate for ileus  PORTABLE ABDOMEN - 1 VIEW  Comparison: None  Findings: The nasogastric tube tip is in the stomach.  The bowel gas pattern appears normal.  No dilated loops of small bowel or air- fluid levels.  Gas and stool noted throughout the colon up to the rectum.  IMPRESSION:  1.   Nonobstructive bowel gas pattern.   Original Report Authenticated By: Signa Kell, M.D.      1. Subarachnoid hemorrhage       MDM          Nelia Shi, MD 05/10/12 [phone]

## 2012-05-09 NOTE — H&P (Signed)
Shannon Yu is an 56 y.o. female.   Chief Complaint: sah HPI: patient seen at [facility] hospital because of persisten sudden onset of headache different from her previous migraines. Had a ct head which showed sha and transferred to [place]  Past Medical History  Diagnosis Date  . Menopause   . Environmental allergies   . Migraine     Past Surgical History  Procedure Date  . Knee arthroscopy     Left    Family History  Problem Relation Age of Onset  . Emphysema Father     smoker  . Lung cancer Father   . Prostate cancer Father   . Kidney disease Father    Social History:  reports that she has quit smoking. Her smoking use included Cigarettes. She does not have any smokeless tobacco history on file. She reports that she drinks alcohol. She reports that she does not use illicit drugs.  Allergies: No Known Allergies   (Not in a hospital admission)  Results for orders placed during the hospital encounter of 05/09/12 (from the past 48 hour(s))  BASIC METABOLIC PANEL     Status: Abnormal   Collection Time   05/09/12  3:33 PM      Component Value Range Comment   Sodium 139  135 - 145 mEq/L    Potassium 3.6  3.5 - 5.1 mEq/L    Chloride 106  96 - 112 mEq/L    CO2 23  19 - 32 mEq/L    Glucose, Bld 106 (*) 70 - 99 mg/dL    BUN 8  6 - 23 mg/dL    Creatinine, Ser 1.61  0.50 - 1.10 mg/dL    Calcium 8.8  8.4 - 09.6 mg/dL    GFR calc non Af Amer >90  >90 mL/min    GFR calc Af Amer >90  >90 mL/min   CBC WITH DIFFERENTIAL     Status: Abnormal   Collection Time   05/09/12  3:33 PM      Component Value Range Comment   WBC 9.9  4.0 - 10.5 K/uL    RBC 4.11  3.87 - 5.11 MIL/uL    Hemoglobin 13.3  12.0 - 15.0 g/dL    HCT 04.5  40.9 - 81.1 %    MCV 94.6  78.0 - 100.0 fL    MCH 32.4  26.0 - 34.0 pg    MCHC 34.2  30.0 - 36.0 g/dL    RDW 91.4  78.2 - 95.6 %    Platelets 194  150 - 400 K/uL    Neutrophils Relative 89 (*) 43 - 77 %    Neutro Abs 8.7 (*) 1.7 - 7.7 K/uL    Lymphocytes Relative 9 (*) 12 - 46 %    Lymphs Abs 0.9  0.7 - 4.0 K/uL    Monocytes Relative 3  3 - 12 %    Monocytes Absolute 0.3  0.1 - 1.0 K/uL    Eosinophils Relative 0  0 - 5 %    Eosinophils Absolute 0.0  0.0 - 0.7 K/uL    Basophils Relative 0  0 - 1 %    Basophils Absolute 0.0  0.0 - 0.1 K/uL   PROTIME-INR     Status: Normal   Collection Time   05/09/12  3:33 PM      Component Value Range Comment   Prothrombin Time 13.0  11.6 - 15.2 seconds    INR 0.99  0.00 - 1.49  Ct Head Wo Contrast  05/09/2012  *RADIOLOGY REPORT*  Clinical Data: Sudden onset headache.  History migraines.  CT HEAD WITHOUT CONTRAST  Technique:  Contiguous axial images were obtained from the base of the skull through the vertex without contrast.  Comparison: None.  Findings: Bone windows demonstrate low density within the soft tissues of the right temporal region is possibly iatrogenic and related to on the placement. No significant soft tissue swelling. Clear paranasal sinuses and mastoid air cells.  Soft tissue windows demonstrate moderate volume diffuse subarachnoid hemorrhage, including within the basal cisterns and sylvian fissures bilaterally.  Small volume intraventricular hemorrhage layering in the left occipital horn and likely in the right occipital horn.  Development of mild hydrocephalus.  The fourth ventricle is normal in caliber.  There is  a small amount of hemorrhage at the skull base.  No complicating ischemia.  No localizing source.  IMPRESSION:  1.  Diffuse subarachnoid hemorrhage, in a pattern most consistent with aneurysmal bleed. 2.  Intraventricular extension, with early hydrocephalus.  Critical test results telephoned to Dr. Radford Pax at the time of interpretation at 2:40 p.m. on 05/09/2012.   Original Report Authenticated By: Jeronimo Greaves, M.D.     Review of Systems  Constitutional: Negative.   HENT: Positive for neck pain.   Eyes: Negative.   Respiratory: Negative.   Cardiovascular: Negative.     Gastrointestinal: Negative.   Genitourinary: Negative.   Skin: Negative.   Neurological: Positive for dizziness. Negative for headaches.  Endo/Heme/Allergies: Negative.   Psychiatric/Behavioral: Negative.     Blood pressure 146/81, pulse 100, temperature 97.6 F (36.4 C), temperature source Oral, resp. rate 23, SpO2 100.00%. Physical Exam hent, no trauma. Neck stiffness. Cv, nl. Lungs clear, abdomen nl, extremities, nl. NEURO oriented x3. Cn, nl movves all 4 extremities sensory, nnl. Ct head SAH  Assessment/Plan TO be admitted to neuro icu. To get a ct head angio. Spoke with her and husband  Karn Cassis 05/09/2012, 5:44 PM

## 2012-05-09 NOTE — ED Notes (Signed)
Anesthesia arrived and will be assuming care for patient.

## 2012-05-09 NOTE — ED Notes (Signed)
Pt to CT

## 2012-05-09 NOTE — Progress Notes (Signed)
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 Patient to be seen by interventional radilogist for coiling. i did speak with husband about the findings and need to proceed. Also he has info to read about [facility]

## 2012-05-09 NOTE — Anesthesia Preprocedure Evaluation (Addendum)
Anesthesia Evaluation  Patient identified by MRN, date of birth, ID band Patient unresponsive    Reviewed: Allergy & Precautions, H&P , NPO status , Patient's Chart, lab work & pertinent test results, reviewed documented beta blocker date and time   Airway Mallampati: II      Dental No notable dental hx. (+) Teeth Intact   Pulmonary sleep apnea ,    Pulmonary exam normal       Cardiovascular negative cardio ROS      Neuro/Psych  Headaches, negative psych ROS   GI/Hepatic negative GI ROS, Neg liver ROS,   Endo/Other  negative endocrine ROS  Renal/GU negative Renal ROS  negative genitourinary   Musculoskeletal   Abdominal   Peds  Hematology negative hematology ROS (+)   Anesthesia Other Findings   Reproductive/Obstetrics negative OB ROS                          Anesthesia Physical Anesthesia Plan  ASA: III and Emergent  Anesthesia Plan: General   Post-op Pain Management:    Induction: Intravenous  Airway Management Planned: Oral ETT  Additional Equipment: Arterial line  Intra-op Plan:   Post-operative Plan: Post-operative intubation/ventilation  Informed Consent: I have reviewed the patients History and Physical, chart, labs and discussed the procedure including the risks, benefits and alternatives for the proposed anesthesia with the patient or authorized representative who has indicated his/her understanding and acceptance.   Dental advisory given  Plan Discussed with: Anesthesiologist, Surgeon and CRNA  Anesthesia Plan Comments:        Anesthesia Quick Evaluation

## 2012-05-10 ENCOUNTER — Inpatient Hospital Stay (HOSPITAL_COMMUNITY): Payer: Commercial Managed Care - PPO

## 2012-05-10 DIAGNOSIS — J309 Allergic rhinitis, unspecified: Secondary | ICD-10-CM

## 2012-05-10 DIAGNOSIS — G4733 Obstructive sleep apnea (adult) (pediatric): Secondary | ICD-10-CM

## 2012-05-10 DIAGNOSIS — I609 Nontraumatic subarachnoid hemorrhage, unspecified: Secondary | ICD-10-CM | POA: Diagnosis present

## 2012-05-10 DIAGNOSIS — J9601 Acute respiratory failure with hypoxia: Secondary | ICD-10-CM | POA: Diagnosis present

## 2012-05-10 DIAGNOSIS — J9600 Acute respiratory failure, unspecified whether with hypoxia or hypercapnia: Secondary | ICD-10-CM

## 2012-05-10 LAB — CBC WITH DIFFERENTIAL/PLATELET
Basophils Absolute: 0 10*3/uL (ref 0.0–0.1)
HCT: 35.6 % — ABNORMAL LOW (ref 36.0–46.0)
Hemoglobin: 12.2 g/dL (ref 12.0–15.0)
Lymphocytes Relative: 10 % — ABNORMAL LOW (ref 12–46)
Monocytes Absolute: 0.6 10*3/uL (ref 0.1–1.0)
Monocytes Relative: 5 % (ref 3–12)
Neutro Abs: 10.9 10*3/uL — ABNORMAL HIGH (ref 1.7–7.7)
Neutrophils Relative %: 85 % — ABNORMAL HIGH (ref 43–77)
RDW: 12.3 % (ref 11.5–15.5)
WBC: 12.8 10*3/uL — ABNORMAL HIGH (ref 4.0–10.5)

## 2012-05-10 LAB — BLOOD GAS, ARTERIAL
Drawn by: 24487
MECHVT: 500 mL
PEEP: 5 cmH2O
RATE: 14 resp/min
pCO2 arterial: 35.3 mmHg (ref 35.0–45.0)
pH, Arterial: 7.363 (ref 7.350–7.450)
pO2, Arterial: 169 mmHg — ABNORMAL HIGH (ref 80.0–100.0)

## 2012-05-10 LAB — BASIC METABOLIC PANEL
CO2: 22 mEq/L (ref 19–32)
Chloride: 107 mEq/L (ref 96–112)
Creatinine, Ser: 0.62 mg/dL (ref 0.50–1.10)
Potassium: 3.2 mEq/L — ABNORMAL LOW (ref 3.5–5.1)

## 2012-05-10 LAB — GLUCOSE, CAPILLARY: Glucose-Capillary: 118 mg/dL — ABNORMAL HIGH (ref 70–99)

## 2012-05-10 MED ORDER — FENTANYL CITRATE 0.05 MG/ML IJ SOLN
INTRAMUSCULAR | Status: AC
  Filled 2012-05-10: qty 2

## 2012-05-10 MED ORDER — SODIUM CHLORIDE 0.9 % IV SOLN
INTRAVENOUS | Status: AC
  Administered 2012-05-10: 02:00:00 via INTRAVENOUS

## 2012-05-10 MED ORDER — HEPARIN SODIUM (PORCINE) 1000 UNIT/ML IJ SOLN
INTRAMUSCULAR | Status: DC | PRN
  Administered 2012-05-10 (×2): 1000 [IU] via INTRAVENOUS

## 2012-05-10 MED ORDER — NICARDIPINE HCL IN NACL 20-0.86 MG/200ML-% IV SOLN
5.0000 mg/h | INTRAVENOUS | Status: DC
  Administered 2012-05-10: 5 mg/h via INTRAVENOUS
  Filled 2012-05-10: qty 200

## 2012-05-10 MED ORDER — FENTANYL BOLUS VIA INFUSION
25.0000 ug | Freq: Four times a day (QID) | INTRAVENOUS | Status: DC | PRN
  Filled 2012-05-10: qty 100

## 2012-05-10 MED ORDER — INSULIN ASPART 100 UNIT/ML ~~LOC~~ SOLN
0.0000 [IU] | SUBCUTANEOUS | Status: DC
  Administered 2012-05-10 (×2): 1 [IU] via SUBCUTANEOUS

## 2012-05-10 MED ORDER — ACETAMINOPHEN 500 MG PO TABS
1000.0000 mg | ORAL_TABLET | Freq: Four times a day (QID) | ORAL | Status: DC | PRN
  Filled 2012-05-10: qty 2

## 2012-05-10 MED ORDER — SODIUM CHLORIDE 0.9 % IV SOLN
25.0000 ug/h | INTRAVENOUS | Status: DC
  Administered 2012-05-10: 25 ug/h via INTRAVENOUS
  Filled 2012-05-10: qty 50

## 2012-05-10 MED ORDER — MIDAZOLAM HCL 2 MG/2ML IJ SOLN
2.0000 mg | INTRAMUSCULAR | Status: DC | PRN
  Administered 2012-05-10: 2 mg via INTRAVENOUS

## 2012-05-10 MED ORDER — MANNITOL 20 % IV SOLN
Freq: Once | INTRAVENOUS | Status: DC
  Filled 2012-05-10: qty 500

## 2012-05-10 MED ORDER — MIDAZOLAM HCL 2 MG/2ML IJ SOLN
INTRAMUSCULAR | Status: AC
  Filled 2012-05-10: qty 2

## 2012-05-10 MED ORDER — SUCCINYLCHOLINE CHLORIDE 20 MG/ML IJ SOLN
INTRAMUSCULAR | Status: DC | PRN
  Administered 2012-05-09: 140 mg via INTRAVENOUS

## 2012-05-10 MED ORDER — PROPOFOL 10 MG/ML IV BOLUS
INTRAVENOUS | Status: DC | PRN
  Administered 2012-05-09: 130 mg via INTRAVENOUS

## 2012-05-10 MED ORDER — PANTOPRAZOLE SODIUM 40 MG IV SOLR: 40.0000 mg | Freq: Every day | INTRAVENOUS | Status: DC

## 2012-05-10 MED ORDER — ONDANSETRON HCL 4 MG/2ML IJ SOLN
4.0000 mg | Freq: Four times a day (QID) | INTRAMUSCULAR | Status: DC | PRN
  Administered 2012-05-10 – 2012-05-14 (×4): 4 mg via INTRAVENOUS
  Filled 2012-05-10 (×5): qty 2

## 2012-05-10 MED ORDER — MIDAZOLAM HCL 2 MG/2ML IJ SOLN: 2.0000 mg | Freq: Once | INTRAMUSCULAR | Status: AC

## 2012-05-10 MED ORDER — CHLORHEXIDINE GLUCONATE 0.12 % MT SOLN
15.0000 mL | Freq: Two times a day (BID) | OROMUCOSAL | Status: DC
Start: 2012-05-10 — End: 2012-05-12
  Administered 2012-05-10 – 2012-05-11 (×3): 15 mL via OROMUCOSAL
  Filled 2012-05-10 (×3): qty 15

## 2012-05-10 MED ORDER — BIOTENE DRY MOUTH MT LIQD
15.0000 mL | Freq: Four times a day (QID) | OROMUCOSAL | Status: DC
  Administered 2012-05-10 (×2): 15 mL via OROMUCOSAL

## 2012-05-10 MED ORDER — POTASSIUM CHLORIDE 10 MEQ/100ML IV SOLN
10.0000 meq | INTRAVENOUS | Status: AC
  Administered 2012-05-10 (×4): 10 meq via INTRAVENOUS
  Filled 2012-05-10 (×4): qty 100

## 2012-05-10 MED ORDER — LIDOCAINE HCL (CARDIAC) 20 MG/ML IV SOLN
INTRAVENOUS | Status: DC | PRN
  Administered 2012-05-09: 40 mg via INTRAVENOUS

## 2012-05-10 MED ORDER — METOCLOPRAMIDE HCL 5 MG/ML IJ SOLN
5.0000 mg | Freq: Four times a day (QID) | INTRAMUSCULAR | Status: DC | PRN
  Administered 2012-05-10 – 2012-05-11 (×4): 5 mg via INTRAVENOUS
  Filled 2012-05-10 (×5): qty 1

## 2012-05-10 MED ORDER — ACETAMINOPHEN 650 MG RE SUPP: 650.0000 mg | Freq: Four times a day (QID) | RECTAL | Status: DC | PRN

## 2012-05-10 MED ORDER — NIMODIPINE 30 MG PO CAPS
60.0000 mg | ORAL_CAPSULE | ORAL | Status: DC
  Administered 2012-05-10 – 2012-05-27 (×99): 60 mg via ORAL
  Filled 2012-05-10 (×115): qty 2

## 2012-05-10 MED ORDER — IOHEXOL 300 MG/ML  SOLN
400.0000 mL | Freq: Once | INTRAMUSCULAR | Status: AC | PRN
  Administered 2012-05-10: 275 mL via INTRA_ARTERIAL

## 2012-05-10 MED ORDER — ALBUTEROL SULFATE HFA 108 (90 BASE) MCG/ACT IN AERS: 4.0000 | INHALATION_SPRAY | RESPIRATORY_TRACT | Status: DC | PRN

## 2012-05-10 MED ORDER — FENTANYL CITRATE 0.05 MG/ML IJ SOLN
50.0000 ug | INTRAMUSCULAR | Status: DC | PRN
  Administered 2012-05-10 (×3): 50 ug via INTRAVENOUS
  Administered 2012-05-10: 100 ug via INTRAVENOUS
  Administered 2012-05-11: 50 ug via INTRAVENOUS
  Administered 2012-05-11: 100 ug via INTRAVENOUS
  Filled 2012-05-10 (×5): qty 2

## 2012-05-10 NOTE — Progress Notes (Signed)
PT Cancellation Note  Patient Details Name: Shannon Yu MRN: [id number] DOB: [birth year]   Cancelled Treatment:    Reason Eval/Treat Not Completed: Patient not medically ready. Pt recently extubated and is nauseous and lethargic. Requested by Dr. Tyson Alias to wait until tomorrow to attempt evaluation.    Milana Kidney 05/10/2012, 8:37 AM

## 2012-05-10 NOTE — Progress Notes (Signed)
  Pt A&Ox4, but forgetful. Repeats questions at times. Dr. Tyson Alias notified.  Neuro exam otherwise unchanged.   Will continue to monitor.

## 2012-05-10 NOTE — Progress Notes (Signed)
Subjective: Post SAN Day 1. Post coiling of basilar aneurysm day 1 Extubated. CT brain immediate post procedure No new hemorrhage.Vents unchanged. TCDs WNLs.(Today)    .Vent Mode:  [-] CPAP FiO2 (%):  [40 %-60.6 %] 40 % Set Rate:  [14 bmp] 14 bmp Vt Set:  [500 mL] 500 mL PEEP:  [0 cmH20-5.3 cmH20] 0 cmH20 Pressure Support:  [5 cmH20] 5 cmH20 Plateau Pressure:  [14 cmH20] 14 cmH20 unchanged.   Objective: Vital signs in last 24 hours: Temp:  [97.4 F (36.3 C)-100 F (37.8 C)] 97.6 F (36.4 C) (11/02 0805) Pulse Rate:  [84-125] 94  (11/02 0815) Resp:  [12-23] 14  (11/02 0815) BP: (116-176)/(54-96) 118/54 mmHg (11/02 0758) SpO2:  [86 %-100 %] 94 % (11/02 0815) Arterial Line BP: (102-161)/(43-73) 109/43 mmHg (11/02 0700) FiO2 (%):  [40 %-60.6 %] 40 % (11/02 0758) Weight:  [171 lb 15.3 oz (78 kg)] 171 lb 15.3 oz (78 kg) (11/01 2102)    Intake/Output from previous day: 11/01 0701 - 11/02 0700 In: 1523.3 [I.V.:1423.3; IV Piggyback:100] Out: 2200 [Urine:2200] Intake/Output this shift:    ON exam.. Sleepy,arousable to name  Will move all  4s spontaneously and to command intermittently per spouse.. Pupils  2.9mm equal sluggishly reactive. Maintaining PaO2 by breathing on her own. VS BP Variable 110 to 140s systolic/50s diastolic.Marland Kitchen Rt Groin soft. Pulses +   Lab Results:   Basename 05/10/12 0442 05/09/12 1533  WBC 12.8* 9.9  HGB 12.2 13.3  HCT 35.6* 38.9  PLT 195 194   BMET  Basename 05/10/12 0442 05/09/12 1533  NA 140 139  K 3.2* 3.6  CL 107 106  CO2 22 23  GLUCOSE 180* 106*  BUN 6 8  CREATININE 0.62 0.71  CALCIUM 8.3* 8.8   PT/INR  Basename 05/09/12 1533  LABPROT 13.0  INR 0.99   ABG  Basename 05/10/12 0235  PHART 7.363  HCO3 19.5*    Studies/Results: Ct Angio Head W/cm &/or Wo Cm  05/09/2012  *RADIOLOGY REPORT*  Clinical Data:  Subarachnoid hemorrhage beginning this morning. Nausea with dizziness.  CT ANGIOGRAPHY HEAD  Technique:   Multidetector CT imaging of the head was performed using the standard protocol during bolus administration of intravenous contrast.  Multiplanar CT image reconstructions including MIPs were obtained to evaluate the vascular anatomy.  Contrast: 50mL OMNIPAQUE IOHEXOL 350 MG/ML SOLN  Comparison:  CT head earlier in the day.  Findings:  Initial images had to be repeated because of mistiming of the bolus.  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  Ventricular enlargement appears to be slightly worse when compared to scan approximately 6 hours earlier.  No new subarachnoid hemorrhage is seen.  There is good opacification of the cerebral vasculature.  No anterior circulation aneurysm is seen.   Projecting superiorly from the basilar tip is a  4 x 4 x 7 mm aneurysm with slight irregularity at its distal tip.  The neck of the aneurysm measures just under 4 mm wide.  Subarachnoid blood within the suprasellar cistern and in the third ventricle is suggestive of rupture from this aneurysm.  There is no visible vasospasm.  Due to the presence of diffuse subarachnoid hemorrhage, definite exclusion of other aneurysms which might be unruptured is difficult, but none are seen.   Formal catheter angiogram with possible neurointervention based on clinical condition and aneurysm morphology are likely the next best steps.   Review of the MIP images confirms the above findings.  IMPRESSION: Diffuse subarachnoid hemorrhage with  slight worsening of communicating hydrocephalus.  No new hemorrhage from earlier in the day.  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 See comments above.  Findings discussed with Dr. Jeral Fruit shortly after completion of the study.   Original Report Authenticated By: Davonna Belling, M.D.    Ct Head Wo Contrast  05/10/2012  *RADIOLOGY REPORT*  Clinical Data: Post aneurysm coiling.  CT HEAD WITHOUT CONTRAST  Technique:  Contiguous axial images were obtained  from the base of the skull through the vertex without contrast.  Comparison: 05/09/2012  Findings: Diffuse increased density throughout the subarachnoid spaces as seen previously and consistent with diffuse subarachnoid hemorrhage.  Since the previous study, there is increased hemorrhage layering in the posterior horns of the lateral ventricles.  Interval placement of metallic coils, likely at the basilar tip.  Mild ventricular dilatation similar to previous study. Suggestion of developing focus of low attenuation in the left posterior parietal region which may represent developing infarct.  No new hemorrhage.  No mass effect or midline shift. Gray-white matter junctions are distinct.  IMPRESSION: Interval coiling of basilar tip aneurysm.  Persistent diffuse subarachnoid hemorrhage with mild increase of intraventricular hemorrhage.  Persistent ventricular dilatation.  Suggestion of developing focal low attenuation in the left posterior parietal region which could represent developing infarct.   Original Report Authenticated By: Burman Nieves, M.D.    Ct Head Wo Contrast  05/09/2012  *RADIOLOGY REPORT*  Clinical Data: Sudden onset headache.  History migraines.  CT HEAD WITHOUT CONTRAST  Technique:  Contiguous axial images were obtained from the base of the skull through the vertex without contrast.  Comparison: None.  Findings: Bone windows demonstrate low density within the soft tissues of the right temporal region is possibly iatrogenic and related to on the placement. No significant soft tissue swelling. Clear paranasal sinuses and mastoid air cells.  Soft tissue windows demonstrate moderate volume diffuse subarachnoid hemorrhage, including within the basal cisterns and sylvian fissures bilaterally.  Small volume intraventricular hemorrhage layering in the left occipital horn and likely in the right occipital horn.  Development of mild hydrocephalus.  The fourth ventricle is normal in caliber.  There is  a  small amount of hemorrhage at the skull base.  No complicating ischemia.  No localizing source.  IMPRESSION:  1.  Diffuse subarachnoid hemorrhage, in a pattern most consistent with aneurysmal bleed. 2.  Intraventricular extension, with early hydrocephalus.  Critical test results telephoned to Dr. Radford Pax at the time of interpretation at 2:40 p.m. on 05/09/2012.   Original Report Authenticated By: Jeronimo Greaves, M.D.    Dg Chest Port 1 View  05/10/2012  *RADIOLOGY REPORT*  Clinical Data: Endotracheal tube placement.  PORTABLE CHEST - 1 VIEW  Comparison: None.  Findings: Endotracheal tube placed with tip about 5 cm above the carina.  Enteric tube is in place with tip not visible but appears to be below the left hemidiaphragm.  Shallow inspiration with elevation of the right hemidiaphragm.  No focal airspace consolidation in the lungs.  Left costophrenic angle is not included within the field of view.  No right costophrenic angle blunting.  No pneumothorax.  Mediastinal contours appear intact.  IMPRESSION: Endotracheal tube placed with tip about 5 cm above the carina.  No evidence of active pulmonary disease.   Original Report Authenticated By: Burman Nieves, M.D.     Anti-infectives: Anti-infectives    None      Assessment/Plan: s/p  Post coiling of ruptured basilar aneurysm ,day !Marland Kitchen  Plan  1.Med management per NS.. 2. Will follow  Shannon Yu K 05/10/2012

## 2012-05-10 NOTE — Transfer of Care (Signed)
Immediate Anesthesia Transfer of Care Note  Patient: Shannon Yu  Procedure(s) Performed: * No procedures listed *  Patient Location: NICU  Anesthesia Type:General  Level of Consciousness: Patient remains intubated per anesthesia plan  Airway & Oxygen Therapy: Patient remains intubated per anesthesia plan  Post-op Assessment: Report given to PACU RN and Post -op Vital signs reviewed and stable  Post vital signs: Reviewed  Complications: No apparent anesthesia complications

## 2012-05-10 NOTE — H&P (Signed)
Name: Shannon Yu MRN: [id number] DOB: [birth year]    LOS: 1  REFERRING PRIVIDER:  Dr. Jeral Fruit CHIEF COMPLAINT:  Headache, SAH   Brief patient description: 56 yr old Ct head with SAH,  angio showed a basilar tip aneurysm. S/p coiling, VDRF.  Lines/tubes: A line 11/1 left rad>>>  Cultures: MRSA screen 11/1>>>>neg  Antibiotics:  Significant studies procedures and events:  Ct head [facility]/1[facility] 11/1- coiled anuerysm basilar  Level of Care:  ICU Primary Service:  NS Consultants:  PCCM, IR Code Status:  Full Diet:  NPO DVT Px:  scd GI Px:  PPI  HISTORY OF PRESENT ILLNESS:  [name] present to [facility] reg hospital with headache.  Sudden in nature, different in character than her migraines.  Transferred to to [facility] as CT showed SAH.  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    Underwent coiling by IR as above. Remained intubated. Called to assist.    PAST MEDICAL HISTORY :  Past Medical History  Diagnosis Date  . Menopause   . Environmental allergies   . Migraine    Past Surgical History  Procedure Date  . Knee arthroscopy     Left   Prior to Admission medications   Medication Sig Start Date End Date Taking? Authorizing Provider  aspirin 81 MG tablet Take 81 mg by mouth daily.     Yes Historical Provider, MD  fish oil-omega-3 fatty acids 1000 MG capsule Take 1 g by mouth daily.    Yes Historical Provider, MD  Glucosamine-Chondroit-Vit C-Mn (GLUCOSAMINE 1500 COMPLEX PO) Take 1 capsule by mouth daily.    Yes Historical Provider, MD  JINTELI 1-5 MG-MCG TABS Take 1 tablet by mouth daily. 06/01/11  Yes Historical Provider, MD   No Known Allergies  FAMILY HISTORY:  Family History  Problem Relation Age of Onset  . Emphysema Father     smoker  . Lung cancer Father   . Prostate cancer Father   . Kidney disease Father    SOCIAL HISTORY:  reports that she has quit smoking. Her smoking use included Cigarettes. She does not have any smokeless tobacco history on file. She reports that she drinks alcohol. She reports that she does not use illicit drugs.  REVIEW OF SYSTEMS:  Unobtainable, vent, sedated   Interval/ subjective:   Vital Signs: Temp:  [97.4 F (36.3 C)-100 F (37.8 C)] 98.6 F (37 C) (11/02 0400) Pulse Rate:  [84-125] 84  (11/02 0700) Resp:  [12-23] 14  (11/02 0700) BP: (116-176)/(64-96) 116/67 mmHg (11/02 0700) SpO2:  [86 %-100 %] 95 % (11/02 0700) Arterial Line BP: (102-161)/(43-73) 109/43 mmHg (11/02 0700) FiO2 (%):  [59.5 %-60.6 %] 60 % (11/02 0700) Weight:  [78 kg (171 lb 15.3 oz)] 78 kg (171 lb 15.3 oz) (11/01 2102)  Physical Examination: General:  Awake, Rass -1 Neuro:  Nonfocal, awake, perrl 2 mm HEENT:  Ett, jvd wnl Cardiovascular:  s1 s2 rrrt no m  Lungs:  ronchi mild Abdomen:  Soft, bs wnl , no r Musculoskeletal:  No derorm Skin:  No rash  DIAGNOSES: Active Problems:  * No active hospital problems. *    ASSESSMENT / PLAN: PULMONARY  Lab 05/10/12 0235  PHART 7.363  PCO2ART 35.3  PO2ART 169.0*  HCO3 19.5*  O2SAT 98.9    Ventilator Settings: Vent Mode:  [-] PRVC FiO2 (%):  [59.5 %-  60.6 %] 60 % Set Rate:  [14 bmp] 14 bmp Vt Set:  [500 mL] 500 mL PEEP:  [5 cmH20-5.3 cmH20] 5 cmH20 Plateau Pressure:  [14 cmH20] 14 cmH20 CXR:  smal lung volumes, ett wnl   A:  Acute resp failure, post coiling P:   Abg reviewed Wean cpap 5 ps 5, assess rsbi, at 30 in  asses strength pcxr to follow May need slight Tv reduction  CARDIOVASCULAR No results found for this basename: TROPONIN:3,LATICACIDVEN:3,O2SATVEN:3,PROBNP:3 in the last 168 hours ECG:  none  A: HTn controlled in setting sah, coiling P:  Eval ecg x 1 Nicardipine now off MAp goal 80 now post coil, see neuro Low threshold cvp placement  RENAL  Lab 05/10/12 0442 05/09/12 1533  NA 140 139  K 3.2* 3.6  CL 107 106  CO2  22 23  BUN 6 8  CREATININE 0.62 0.71  CALCIUM 8.3* 8.8  MG -- --  PHOS -- --   Intake/Output      11/01 0701 - 11/02 0700 11/02 0701 - 11/03 0700   I.V. (mL/kg) 1423.3 (18.2)    IV Piggyback 100    Total Intake(mL/kg) 1523.3 (19.5)    Urine (mL/kg/hr) 2200 (1.2)    Total Output 2200    Net -676.7         Emesis Occurrence 2 x      A:  hypokalemia P:   Volume good k supp Chem in am  Treat vomiting  GASTROINTESTINAL No results found for this basename: AST:3,ALT:3,ALKPHOS:3BILITOT:3,PROT:3,ALBUMIN:3 in the last 168 hours  A:  Vomiting, likely from ETT, SAH contribution P:   zofran Hope for weaning success to dc ett replace K kub ngt to suction ppi lft in am  HEMATOLOGIC  Lab 05/10/12 0442 05/09/12 1533  HGB 12.2 13.3  HCT 35.6* 38.9  PLT 195 194  INR -- 0.99  APTT -- --    A:  SAH, high risk DVt P:  scd Cbc in am   INFECTIOUS  Lab 05/10/12 0442 05/09/12 1533  PROCALCITON -- --  WBC 12.8* 9.9  LATICACIDVEN -- --    A:  No evidence infection P:   pcxr to assess asp pna  ENDOCRINE CBG (last 3)  No results found for this basename: GLUCAP:5 in the last 168 hours  A: Hyperglycemia  P:   SSI ensure, needed  NEUROLOGIC  A:  SAH, s/p coiling, at risk vasospasm, Early infarct area on CT?, early vasospasm? P:   Nimodipine x 21 days Early initiation HHH, risk typical at day 3, but is there an ealy infarct on CT? Consider MRI Change goals BP to higher, consider MAP 80-85 for now Saline Low threshold cvp Early TCD Dc fentanyl Will review CT head with NS  Summary statement:  S/p coil. On vent. Likely goals will be to extubate. Does she have early ischemic area. Lean toward earlier intiation HHH. Likely will place line.   I have personally obtained a history, examined the patient, evaluated laboratory and imaging results, formulated the assessment and plan and placed orders.  CRITICAL CARE: The patient is critically ill with multiple organ  systems failure and requires high complexity decision making for assessment and support, frequent evaluation and titration of therapies, application of advanced monitoring technologies and extensive interpretation of multiple databases. Critical Care Time devoted to patient care services described in this note is 30 minutes.   Mcarthur Rossetti. Tyson Alias, MD, FACP Pgr: [phone] [facility]  Pulmonary and Critical Care  Medicine [employer] Pager: [phone]  05/10/2012, 7:46 AM

## 2012-05-10 NOTE — Procedures (Signed)
S/P 4 vessel cerebral arteriogram  RT CFA approach  Findings  Appro 7.31mm x 4.5 mm basilar artery apex aneurysm S/P endovascular coiling with near complete obliteration.

## 2012-05-10 NOTE — Progress Notes (Signed)
Patient ID: Shannon Yu, female   DOB: 20-Jul-1955, 56 y.o.   MRN: [id number] C/o headche. Neuro stable. No weakness, coiling of aneurysm done. CCM to start treatment to prevent vasospasm.

## 2012-05-10 NOTE — Progress Notes (Addendum)
VASCULAR LAB PRELIMINARY  PRELIMINARY  PRELIMINARY  PRELIMINARY  Transcranial Doppler  Date POD PCO2 HCT BP  MCA ACA PCA OPHT SIPH VERT Basilar  05-10-12 hc 0 35.3 at 0235 35.6 at 0442 118/54 at 0758 Right  Left   38  19   -25  -19   24  20   12  14   28  28    -33  -20   -36           Right  Left                                            Right  Left                                             Right  Left                                             Right  Left                                            Right  Left                                            Right  Left                                        MCA = Middle Cerebral Artery      OPHT = Opthalmic Artery     BASILAR = Basilar Artery   ACA = Anterior Cerebral Artery     SIPH = Carotid Siphon PCA = Posterior Cerebral Artery   VERT = Verterbral Artery                   Normal MCA = 62+\-12 ACA = 50+\-12 PCA = 42+\-23    Shannon Yu, RVT 05/10/2012, 9:30 AM

## 2012-05-10 NOTE — Procedures (Signed)
Extubation Procedure Note  Patient Details:   Name: Shannon Yu DOB: [birth year] MRN: [id number]  Pt extubated to [facility] after successful SBT.  Pt tolerated well.  Pt able to vocalize and has good cough.      Evaluation  O2 sats: stable throughout Complications: No apparent complications Patient did tolerate procedure well. Bilateral Breath Sounds: Clear   Yes  Teddrick Mallari Apple 05/10/2012, 8:33 AM

## 2012-05-10 NOTE — Progress Notes (Signed)
eLink Physician-Brief Progress Note Patient Name: Shannon Yu DOB: 02-26-1956 MRN: [id number]  Date of Service  05/10/2012   HPI/Events of Note  Hypokalemia    eICU Interventions  K replaced       Alexandra Posadas 05/10/2012, 5:36 AM

## 2012-05-10 NOTE — Anesthesia Postprocedure Evaluation (Signed)
  Anesthesia Post-op Note  Patient: Shannon Yu  Procedure(s) Performed: * No procedures listed *  Patient Location: PACU  Anesthesia Type:General  Level of Consciousness: sedated and unresponsive  Airway and Oxygen Therapy: Patient remains intubated per anesthesia plan  Post-op Pain: none  Post-op Assessment: Post-op Vital signs reviewed, Patient's Cardiovascular Status Stable and Respiratory Function Stable  Post-op Vital Signs: Reviewed and stable  Complications: No apparent anesthesia complications

## 2012-05-11 ENCOUNTER — Inpatient Hospital Stay (HOSPITAL_COMMUNITY): Payer: Commercial Managed Care - PPO

## 2012-05-11 LAB — COMPREHENSIVE METABOLIC PANEL
ALT: 11 U/L (ref 0–35)
Albumin: 3.2 g/dL — ABNORMAL LOW (ref 3.5–5.2)
Alkaline Phosphatase: 63 U/L (ref 39–117)
BUN: 9 mg/dL (ref 6–23)
Calcium: 8.6 mg/dL (ref 8.4–10.5)
GFR calc Af Amer: >90 mL/min (ref >90)
Potassium: 3.5 mEq/L (ref 3.5–5.1)
Sodium: 137 mEq/L (ref 135–145)
Total Protein: 6.8 g/dL (ref 6.0–8.3)

## 2012-05-11 LAB — CBC WITH DIFFERENTIAL/PLATELET
Basophils Relative: 0 % (ref 0–1)
Eosinophils Absolute: 0 10*3/uL (ref 0.0–0.7)
Eosinophils Relative: 0 % (ref 0–5)
MCH: 32.3 pg (ref 26.0–34.0)
MCHC: 34 g/dL (ref 30.0–36.0)
MCV: 94.9 fL (ref 78.0–100.0)
Neutrophils Relative %: 80 % — ABNORMAL HIGH (ref 43–77)
Platelets: 191 10*3/uL (ref 150–400)

## 2012-05-11 LAB — BASIC METABOLIC PANEL
BUN: 9 mg/dL (ref 6–23)
Chloride: 103 mEq/L (ref 96–112)
GFR calc Af Amer: >90 mL/min (ref >90)
GFR calc non Af Amer: >90 mL/min (ref >90)
Potassium: 3.3 mEq/L — ABNORMAL LOW (ref 3.5–5.1)
Sodium: 136 mEq/L (ref 135–145)

## 2012-05-11 LAB — GLUCOSE, CAPILLARY: Glucose-Capillary: 112 mg/dL — ABNORMAL HIGH (ref 70–99)

## 2012-05-11 LAB — SODIUM, URINE, RANDOM: Sodium, Ur: 130 mEq/L

## 2012-05-11 MED ORDER — HYDROMORPHONE HCL PF 1 MG/ML IJ SOLN
0.5000 mg | INTRAMUSCULAR | Status: DC | PRN
  Administered 2012-05-11 – 2012-05-12 (×7): 0.5 mg via INTRAVENOUS
  Filled 2012-05-11 (×7): qty 1

## 2012-05-11 MED ORDER — SODIUM CHLORIDE 0.9 % IV SOLN
INTRAVENOUS | Status: DC
  Administered 2012-05-11 – 2012-05-12 (×3): via INTRAVENOUS

## 2012-05-11 MED ORDER — DIPHENHYDRAMINE HCL 50 MG/ML IJ SOLN
25.0000 mg | Freq: Four times a day (QID) | INTRAMUSCULAR | Status: DC | PRN
  Administered 2012-05-11 – 2012-05-12 (×2): 25 mg via INTRAVENOUS
  Filled 2012-05-11 (×2): qty 1

## 2012-05-11 MED ORDER — WHITE PETROLATUM GEL
Status: AC
  Administered 2012-05-11: 1
  Filled 2012-05-11: qty 5

## 2012-05-11 MED ORDER — SODIUM CHLORIDE 0.9 % IV BOLUS (SEPSIS)
500.0000 mL | Freq: Once | INTRAVENOUS | Status: AC
  Administered 2012-05-11: 500 mL via INTRAVENOUS

## 2012-05-11 NOTE — Evaluation (Signed)
Physical Therapy Evaluation Patient Details Name: Shannon Yu MRN: [id number] DOB: [birth year] Today's Date: 05/11/2012 Time: 4696-2952 PT Time Calculation (min): 19 min  PT Assessment / Plan / Recommendation Clinical Impression  Pt admitted with brain aneurysm, s/p endovascular coiling with near complete obliteration of basilar artery. Pt with good functional mobility although limited secondary to dizziness. VSS throughout. Will continue to evaluate in further sessions for ambulation and balance pending pt willingness and medical stability. Pt will benefit from skilled PT in the acute care setting in order to return to PLOF for a safe d/c    PT Assessment  Patient needs continued PT services    Follow Up Recommendations  Other (comment) (TBD)    Does the patient have the potential to tolerate intense rehabilitation      Barriers to Discharge        Equipment Recommendations  Other (comment) (TBD)    Recommendations for Other Services     Frequency Min 3X/week    Precautions / Restrictions Precautions Precautions: Fall Restrictions Weight Bearing Restrictions: No   Pertinent Vitals/Pain No complaints of pain. Pt complained of dizziness with all movement, VSS. RN in room and aware.      Mobility  Bed Mobility Bed Mobility: Supine to Sit;Sitting - Scoot to Edge of Bed Supine to Sit: 4: Min assist;With rails Sitting - Scoot to [place] of Bed: 4: Min guard Details for Bed Mobility Assistance: Assist to support trunk OOB Transfers Transfers: Sit to Stand;Stand to [employer] Transfers Sit to Stand: 4: Min assist;From bed;With upper extremity assist Stand to Sit: 4: Min guard;With armrests;To chair/3-in-1 Stand Pivot Transfers: 4: Min assist Details for Transfer Assistance: Min assist for steadying. Transfer from bed to chair with cues for safety.  Ambulation/Gait Ambulation/Gait Assistance: Not tested (comment) (pt refused)    Shoulder Instructions     Exercises      PT Diagnosis: Difficulty walking;Acute pain  PT Problem List: Decreased activity tolerance;Decreased balance;Decreased mobility;Decreased knowledge of use of DME;Decreased safety awareness;Decreased knowledge of precautions;Pain PT Treatment Interventions: DME instruction;Gait training;Functional mobility training;Therapeutic activities;Stair training;Balance training;Neuromuscular re-education;Patient/family education   PT Goals Acute Rehab PT Goals PT Goal Formulation: With patient Time For Goal Achievement: 05/25/12 Potential to Achieve Goals: Good Pt will go Sit to Stand: with modified independence PT Goal: Sit to Stand - Progress: Goal set today Pt will go Stand to Sit: with modified independence PT Goal: Stand to Sit - Progress: Goal set today Pt will Transfer Bed to Chair/Chair to Bed: with modified independence PT Transfer Goal: Bed to Chair/Chair to Bed - Progress: Goal set today Pt will Ambulate: >150 feet;with modified independence;with least restrictive assistive device PT Goal: Ambulate - Progress: Goal set today Pt will Go Up / Down Stairs: Flight;with supervision;with rail(s) PT Goal: Up/Down Stairs - Progress: Goal set today  Visit Information  Last PT Received On: 05/11/12 Assistance Needed: +1 PT/OT Co-Evaluation/Treatment: Yes    Subjective Data  Patient Stated Goal: to be able to go home normal   Prior Functioning  Home Living Lives With: Spouse Available Help at Discharge: Family;Available PRN/intermittently Type of Home: House Home Access: Level entry Home Layout: Multi-level;Able to live on main level with bedroom/bathroom Bathroom Shower/Tub: Tub/shower unit;Curtain Bathroom Toilet: Handicapped height Bathroom Accessibility: Yes How Accessible: Accessible via walker Home Adaptive Equipment: None Prior Function Level of Independence: Independent Able to Take Stairs?: Yes Driving: Yes 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 Communication: No difficulties Dominant Hand: Right  Cognition  Overall Cognitive Status: Appears within functional limits for tasks assessed/performed Arousal/Alertness: Awake/alert Orientation Level: Appears intact for tasks assessed Behavior During Session: Flat affect    Extremity/Trunk Assessment Right Upper Extremity Assessment RUE ROM/Strength/Tone: WFL for tasks assessed Left Upper Extremity Assessment LUE ROM/Strength/Tone: WFL for tasks assessed Right Lower Extremity Assessment RLE ROM/Strength/Tone: Within functional levels RLE Sensation: WFL - Light Touch Left Lower Extremity Assessment LLE ROM/Strength/Tone: Within functional levels LLE Sensation: WFL - Light Touch   Balance Balance Balance Assessed: Yes Static Sitting Balance Static Sitting - Balance Support: Feet supported Static Sitting - Level of Assistance: 5: Stand by assistance Static Sitting - Comment/# of Minutes: 5 minutes EOB Dynamic Sitting Balance Dynamic Sitting - Balance Support: During functional activity;Feet supported Dynamic Sitting - Level of Assistance:  (min guard) Dynamic Sitting Balance - Compensations: guarding for safety while pt attempted to don socks at EOB  End of Session PT - End of Session Equipment Utilized During Treatment: Gait belt Activity Tolerance: Patient limited by fatigue;Treatment limited secondary to medical complications (Comment) (dizziness) Patient left: in chair;with call bell/phone within reach;with family/visitor present;with nursing in room Nurse Communication: Mobility status  GP     Milana Kidney 05/11/2012, 5:29 PM  05/11/2012 Milana Kidney DPT PAGER: [phone] OFFICE: [phone]

## 2012-05-11 NOTE — Progress Notes (Signed)
Subjective: Pt awake/alert; c/o moderate HA (frontal/occipital), mild nausea; family in room  Objective: Vital signs in last 24 hours: Temp:  [97.7 F (36.5 C)-98.8 F (37.1 C)] 98.4 F (36.9 C) (11/03 0800) Pulse Rate:  [68-93] 73  (11/03 0900) Resp:  [9-22] 12  (11/03 0900) BP: (110-154)/(51-84) 129/79 mmHg (11/03 0900) SpO2:  [80 %-100 %] 98 % (11/03 0900) Arterial Line BP: (138-176)/(55-71) 172/69 mmHg (11/03 0900)    Intake/Output from previous day: 11/02 0701 - 11/03 0700 In: 1170 [P.O.:720; I.V.:450] Out: 1425 [Urine:1425] Intake/Output this shift: Total I/O In: 120 [P.O.:120] Out: -   Pt alert and oriented, speech nl, tongue midline, no drift; FMM, finger to nose nl, pupils sl react, equal/EOMI; strength 5/5 all fours, sens fxn intact. Rt groin sift,NT, no hematoma  Lab Results:   Basename 05/11/12 0420 05/10/12 0442  WBC 12.2* 12.8*  HGB 12.0 12.2  HCT 35.3* 35.6*  PLT 191 195   BMET  Basename 05/11/12 0420 05/10/12 0442  NA 137 140  K 3.5 3.2*  CL 105 107  CO2 23 22  GLUCOSE 118* 180*  BUN 9 6  CREATININE 0.54 0.62  CALCIUM 8.6 8.3*   PT/INR  Basename 05/09/12 1533  LABPROT 13.0  INR 0.99   ABG  Basename 05/10/12 0235  PHART 7.363  HCO3 19.5*    Studies/Results: Ct Angio Head W/cm &/or Wo Cm  05/09/2012  *RADIOLOGY REPORT*  Clinical Data:  Subarachnoid hemorrhage beginning this morning. Nausea with dizziness.  CT ANGIOGRAPHY HEAD  Technique:  Multidetector CT imaging of the head was performed using the standard protocol during bolus administration of intravenous contrast.  Multiplanar CT image reconstructions including MIPs were obtained to evaluate the vascular anatomy.  Contrast: 50mL OMNIPAQUE IOHEXOL 350 MG/ML SOLN  Comparison:  CT head earlier in the day.  Findings:  Initial images had to be repeated because of mistiming of the bolus.  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  Ventricular enlargement  appears to be slightly worse when compared to scan approximately 6 hours earlier.  No new subarachnoid hemorrhage is seen.  There is good opacification of the cerebral vasculature.  No anterior circulation aneurysm is seen.   Projecting superiorly from the basilar tip is a  4 x 4 x 7 mm aneurysm with slight irregularity at its distal tip.  The neck of the aneurysm measures just under 4 mm wide.  Subarachnoid blood within the suprasellar cistern and in the third ventricle is suggestive of rupture from this aneurysm.  There is no visible vasospasm.  Due to the presence of diffuse subarachnoid hemorrhage, definite exclusion of other aneurysms which might be unruptured is difficult, but none are seen.   Formal catheter angiogram with possible neurointervention based on clinical condition and aneurysm morphology are likely the next best steps.   Review of the MIP images confirms the above findings.  IMPRESSION: Diffuse subarachnoid hemorrhage with slight worsening of communicating hydrocephalus.  No new hemorrhage from earlier in the day.  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 See comments above.  Findings discussed with Dr. Jeral Fruit shortly after completion of the study.   Original Report Authenticated By: Davonna Belling, M.D.    Ct Head Wo Contrast  05/10/2012  *RADIOLOGY REPORT*  Clinical Data: Post aneurysm coiling.  CT HEAD WITHOUT CONTRAST  Technique:  Contiguous axial images were obtained from the base of the skull through the vertex without contrast.  Comparison: 05/09/2012  Findings: Diffuse increased  density throughout the subarachnoid spaces as seen previously and consistent with diffuse subarachnoid hemorrhage.  Since the previous study, there is increased hemorrhage layering in the posterior horns of the lateral ventricles.  Interval placement of metallic coils, likely at the basilar tip.  Mild ventricular dilatation similar to previous study. Suggestion of developing focus of low  attenuation in the left posterior parietal region which may represent developing infarct.  No new hemorrhage.  No mass effect or midline shift. Gray-white matter junctions are distinct.  IMPRESSION: Interval coiling of basilar tip aneurysm.  Persistent diffuse subarachnoid hemorrhage with mild increase of intraventricular hemorrhage.  Persistent ventricular dilatation.  Suggestion of developing focal low attenuation in the left posterior parietal region which could represent developing infarct.   Original Report Authenticated By: Burman Nieves, M.D.    Ct Head Wo Contrast  05/09/2012  *RADIOLOGY REPORT*  Clinical Data: Sudden onset headache.  History migraines.  CT HEAD WITHOUT CONTRAST  Technique:  Contiguous axial images were obtained from the base of the skull through the vertex without contrast.  Comparison: None.  Findings: Bone windows demonstrate low density within the soft tissues of the right temporal region is possibly iatrogenic and related to on the placement. No significant soft tissue swelling. Clear paranasal sinuses and mastoid air cells.  Soft tissue windows demonstrate moderate volume diffuse subarachnoid hemorrhage, including within the basal cisterns and sylvian fissures bilaterally.  Small volume intraventricular hemorrhage layering in the left occipital horn and likely in the right occipital horn.  Development of mild hydrocephalus.  The fourth ventricle is normal in caliber.  There is  a small amount of hemorrhage at the skull base.  No complicating ischemia.  No localizing source.  IMPRESSION:  1.  Diffuse subarachnoid hemorrhage, in a pattern most consistent with aneurysmal bleed. 2.  Intraventricular extension, with early hydrocephalus.  Critical test results telephoned to Dr. Radford Pax at the time of interpretation at 2:40 p.m. on 05/09/2012.   Original Report Authenticated By: Jeronimo Greaves, M.D.    Dg Chest Port 1 View  05/11/2012  *RADIOLOGY REPORT*  Clinical Data: Cough, congestion   PORTABLE CHEST - 1 VIEW  Comparison: 05/10/2012  Findings: The patient has been extubated.  NG tube also removed. Normal heart size and vascularity.  Persistent medial right lower lobe atelectasis.  Left lung clear.  No effusion or pneumothorax. Trachea is midline.  IMPRESSION: Extubated.  Residual medial right base atelectasis.   Original Report Authenticated By: Judie Petit. Miles Costain, M.D.    Dg Chest Port 1 View  05/10/2012  *RADIOLOGY REPORT*  Clinical Data: Endotracheal tube placement.  PORTABLE CHEST - 1 VIEW  Comparison: None.  Findings: Endotracheal tube placed with tip about 5 cm above the carina.  Enteric tube is in place with tip not visible but appears to be below the left hemidiaphragm.  Shallow inspiration with elevation of the right hemidiaphragm.  No focal airspace consolidation in the lungs.  Left costophrenic angle is not included within the field of view.  No right costophrenic angle blunting.  No pneumothorax.  Mediastinal contours appear intact.  IMPRESSION: Endotracheal tube placed with tip about 5 cm above the carina.  No evidence of active pulmonary disease.   Original Report Authenticated By: Burman Nieves, M.D.    Dg Abd Portable 1v  05/10/2012  *RADIOLOGY REPORT*  Clinical Data: Evaluate for ileus  PORTABLE ABDOMEN - 1 VIEW  Comparison: None  Findings: The nasogastric tube tip is in the stomach.  The bowel gas pattern appears normal.  No dilated loops of small bowel or air- fluid levels.  Gas and stool noted throughout the colon up to the rectum.  IMPRESSION:  1.  Nonobstructive bowel gas pattern.   Original Report Authenticated By: Signa Kell, M.D.     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 Plans as per NS/CCM. Monitor for vasospasm. Follow CT head.  LOS: 2 days    ALLRED,D [facility] 05/11/2012

## 2012-05-11 NOTE — Progress Notes (Signed)
Subjective: Patient reports headache  Objective: Vital signs in last 24 hours: Temp:  [98.4 F (36.9 C)-98.8 F (37.1 C)] 98.8 F (37.1 C) (11/03 1204) Pulse Rate:  [64-93] 68  (11/03 1200) Resp:  [9-20] 12  (11/03 1200) BP: (110-154)/(51-85) 136/74 mmHg (11/03 1200) SpO2:  [80 %-100 %] 98 % (11/03 1200) Arterial Line BP: (138-176)/(55-74) 175/74 mmHg (11/03 1100)  Intake/Output from previous day: 11/02 0701 - 11/03 0700 In: 1170 [P.O.:720; I.V.:450] Out: 1425 [Urine:1425] Intake/Output this shift: Total I/O In: 120 [P.O.:120] Out: -   Awake, oriented x 3. No weakness. Cn, wnl. Continue as per CCM  Lab Results:  Basename 05/11/12 0420 05/10/12 0442  WBC 12.2* 12.8*  HGB 12.0 12.2  HCT 35.3* 35.6*  PLT 191 195   BMET  Basename 05/11/12 0420 05/10/12 0442  NA 137 140  K 3.5 3.2*  CL 105 107  CO2 23 22  GLUCOSE 118* 180*  BUN 9 6  CREATININE 0.54 0.62  CALCIUM 8.6 8.3*    Studies/Results: Ct Angio Head W/cm &/or Wo Cm  05/09/2012  *RADIOLOGY REPORT*  Clinical Data:  Subarachnoid hemorrhage beginning this morning. Nausea with dizziness.  CT ANGIOGRAPHY HEAD  Technique:  Multidetector CT imaging of the head was performed using the standard protocol during bolus administration of intravenous contrast.  Multiplanar CT image reconstructions including MIPs were obtained to evaluate the vascular anatomy.  Contrast: 50mL OMNIPAQUE IOHEXOL 350 MG/ML SOLN  Comparison:  CT head earlier in the day.  Findings:  Initial images had to be repeated because of mistiming of the bolus.  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  Ventricular enlargement appears to be slightly worse when compared to scan approximately 6 hours earlier.  No new subarachnoid hemorrhage is seen.  There is good opacification of the cerebral vasculature.  No anterior circulation aneurysm is seen.   Projecting superiorly from the basilar tip is a  4 x 4 x 7 mm aneurysm with slight  irregularity at its distal tip.  The neck of the aneurysm measures just under 4 mm wide.  Subarachnoid blood within the suprasellar cistern and in the third ventricle is suggestive of rupture from this aneurysm.  There is no visible vasospasm.  Due to the presence of diffuse subarachnoid hemorrhage, definite exclusion of other aneurysms which might be unruptured is difficult, but none are seen.   Formal catheter angiogram with possible neurointervention based on clinical condition and aneurysm morphology are likely the next best steps.   Review of the MIP images confirms the above findings.  IMPRESSION: Diffuse subarachnoid hemorrhage with slight worsening of communicating hydrocephalus.  No new hemorrhage from earlier in the day.  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 See comments above.  Findings discussed with Dr. Jeral Fruit shortly after completion of the study.   Original Report Authenticated By: Davonna Belling, M.D.    Ct Head Wo Contrast  05/10/2012  *RADIOLOGY REPORT*  Clinical Data: Post aneurysm coiling.  CT HEAD WITHOUT CONTRAST  Technique:  Contiguous axial images were obtained from the base of the skull through the vertex without contrast.  Comparison: 05/09/2012  Findings: Diffuse increased density throughout the subarachnoid spaces as seen previously and consistent with diffuse subarachnoid hemorrhage.  Since the previous study, there is increased hemorrhage layering in the posterior horns of the lateral ventricles.  Interval placement of metallic coils, likely at the basilar tip.  Mild ventricular dilatation similar to previous study. Suggestion of developing focus of  low attenuation in the left posterior parietal region which may represent developing infarct.  No new hemorrhage.  No mass effect or midline shift. Gray-white matter junctions are distinct.  IMPRESSION: Interval coiling of basilar tip aneurysm.  Persistent diffuse subarachnoid hemorrhage with mild increase of  intraventricular hemorrhage.  Persistent ventricular dilatation.  Suggestion of developing focal low attenuation in the left posterior parietal region which could represent developing infarct.   Original Report Authenticated By: Burman Nieves, M.D.    Ct Head Wo Contrast  05/09/2012  *RADIOLOGY REPORT*  Clinical Data: Sudden onset headache.  History migraines.  CT HEAD WITHOUT CONTRAST  Technique:  Contiguous axial images were obtained from the base of the skull through the vertex without contrast.  Comparison: None.  Findings: Bone windows demonstrate low density within the soft tissues of the right temporal region is possibly iatrogenic and related to on the placement. No significant soft tissue swelling. Clear paranasal sinuses and mastoid air cells.  Soft tissue windows demonstrate moderate volume diffuse subarachnoid hemorrhage, including within the basal cisterns and sylvian fissures bilaterally.  Small volume intraventricular hemorrhage layering in the left occipital horn and likely in the right occipital horn.  Development of mild hydrocephalus.  The fourth ventricle is normal in caliber.  There is  a small amount of hemorrhage at the skull base.  No complicating ischemia.  No localizing source.  IMPRESSION:  1.  Diffuse subarachnoid hemorrhage, in a pattern most consistent with aneurysmal bleed. 2.  Intraventricular extension, with early hydrocephalus.  Critical test results telephoned to Dr. Radford Pax at the time of interpretation at 2:40 p.m. on 05/09/2012.   Original Report Authenticated By: Jeronimo Greaves, M.D.    Dg Chest Port 1 View  05/11/2012  *RADIOLOGY REPORT*  Clinical Data: Cough, congestion  PORTABLE CHEST - 1 VIEW  Comparison: 05/10/2012  Findings: The patient has been extubated.  NG tube also removed. Normal heart size and vascularity.  Persistent medial right lower lobe atelectasis.  Left lung clear.  No effusion or pneumothorax. Trachea is midline.  IMPRESSION: Extubated.  Residual medial  right base atelectasis.   Original Report Authenticated By: Judie Petit. Miles Costain, M.D.    Dg Chest Port 1 View  05/10/2012  *RADIOLOGY REPORT*  Clinical Data: Endotracheal tube placement.  PORTABLE CHEST - 1 VIEW  Comparison: None.  Findings: Endotracheal tube placed with tip about 5 cm above the carina.  Enteric tube is in place with tip not visible but appears to be below the left hemidiaphragm.  Shallow inspiration with elevation of the right hemidiaphragm.  No focal airspace consolidation in the lungs.  Left costophrenic angle is not included within the field of view.  No right costophrenic angle blunting.  No pneumothorax.  Mediastinal contours appear intact.  IMPRESSION: Endotracheal tube placed with tip about 5 cm above the carina.  No evidence of active pulmonary disease.   Original Report Authenticated By: Burman Nieves, M.D.    Dg Abd Portable 1v  05/10/2012  *RADIOLOGY REPORT*  Clinical Data: Evaluate for ileus  PORTABLE ABDOMEN - 1 VIEW  Comparison: None  Findings: The nasogastric tube tip is in the stomach.  The bowel gas pattern appears normal.  No dilated loops of small bowel or air- fluid levels.  Gas and stool noted throughout the colon up to the rectum.  IMPRESSION:  1.  Nonobstructive bowel gas pattern.   Original Report Authenticated By: Signa Kell, M.D.     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

## 2012-05-11 NOTE — Progress Notes (Signed)
Pt Alert and Oriented x 3, Continues to ask where she is and what has happened.  Memory problems appear to be isolated to short term only.  Other Neuro assessments unchanged.  Will continue to assess. Elink notified,  Will continue to assess.

## 2012-05-11 NOTE — Progress Notes (Addendum)
PCCM    Name: Shannon Yu MRN: [id number] DOB: 12-Apr-1956    LOS: 2  REFERRING PRIVIDER:  Dr. Jeral Fruit CHIEF COMPLAINT:  Headache, SAH   Brief patient description: [name] present to [facility] reg hospital with headache.  Sudden in nature, different in character than her migraines.  Transferred to to [facility] as CT showed SAH. 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  Status post endovascular coiling with near complete obliteration on 11/1  Lines/tubes: A line 11/1 left rad>>>11/3  Cultures: MRSA screen 11/1>>>>neg  Antibiotics:  Significant studies procedures and events:  Ct head 11/1[facility] 11/1- coiled anuerysm basilar  Level of Care:  ICU Primary Service:  NS Consultants:  PCCM, IR Code Status:  Full Diet:  NPO DVT Px:  scd GI Px:  PPI  Subjective: Complaint of headache only. Neg balance noted  Objective: Vital Signs: Temp:  [97.7 F (36.5 C)-98.8 F (37.1 C)] 98.4 F (36.9 C) (11/03 0800) Pulse Rate:  [68-93] 73  (11/03 0900) Resp:  [9-22] 12  (11/03 0900) BP: (110-154)/(51-84) 129/79 mmHg (11/03 0900) SpO2:  [80 %-100 %] 98 % (11/03 0900) Arterial Line BP: (132-176)/(55-71) 172/69 mmHg (11/03 0900) Room air Physical Examination: General:  Awake, Rass -1 Neuro:  Nonfocal, moves all extremities, oriented x4, has some short-term memory deficits. Reports headache. HEENT: normal cephalic, no JVD or adenopathy. Pupils equal and reactive Cardiovascular:  s1 s2 rrrt no m  Lungs:  clear Abdomen:  Soft, bs wnl , no r Musculoskeletal:  No derorm Skin:  No rash  DIAGNOSES: Active Problems:  Acute respiratory failure with hypoxia  SAH (subarachnoid hemorrhage)   ASSESSMENT / PLAN: PULMONARY  Lab 05/10/12 0235  PHART 7.363  PCO2ART 35.3  PO2ART 169.0*  HCO3 19.5*  O2SAT 98.9    Ventilator Settings:   CXR:  smal lung volumes, ett wnl   A:  Acute resp failure, post coiling (resolved) Mild loss lung volm rt She was  extubated on 11/2, would be high aspiration risk if/when vasospasm begins P:   Pulse oximetry Wean oxygen Aspiration precautions Pulmonary hygiene IS, re eval rt base in future pcxr  CARDIOVASCULAR No results found for this basename: TROPONIN:3,LATICACIDVEN:3,O2SATVEN:3,PROBNP:3 in the last 168 hours ECG:  none  A: HTN controlled in setting sah, coiling, Initiating earlier [facility], see neuro P:  Nicardipine now off, agree MAP goal 80-105 now post coil, see neuro Low threshold cvp placement, especially if neg balance again noted We will DC a line as current waveform inaccurate Restart saline  RENAL  Lab 05/11/12 0420 05/10/12 0442 05/09/12 1533  NA 137 140 139  K 3.5 3.2* 3.6  CL 105 107 106  CO2 23 22 23   BUN 9 6 8   CREATININE 0.54 0.62 0.71  CALCIUM 8.6 8.3* 8.8  MG -- -- --  PHOS -- -- --   Intake/Output      11/02 0701 - 11/03 0700 11/03 0701 - 11/04 0700   P.O. 720 120   I.V. (mL/kg) 450 (5.8)    IV Piggyback     Total Intake(mL/kg) 1170 (15) 120 (1.5)   Urine (mL/kg/hr) 1425 (0.8)    Total Output 1425    Net -255 +120          A:  Hypokalemia,  Still borderline Volume good HHH At risk CSW P:   k supp and replace Chem in am  Avoid neg balance, start 75 cc/hr, assess urine na Follow na  GASTROINTESTINAL  Lab 05/11/12 0420  AST 17  ALT 11  ALKPHOS 63  PROT 6.8  ALBUMIN 3.2*    A:  Vomiting, likely from ETT, SAH contribution No further vomiting episodes P:   zofran Clears? ppi HHH, bolus  HEMATOLOGIC  Lab 05/11/12 0420 05/10/12 0442 05/09/12 1533  HGB 12.0 12.2 13.3  HCT 35.3* 35.6* 38.9  PLT 191 195 194  INR -- -- 0.99  APTT -- -- --    A:  SAH, high risk DVt P:  scd Cbc in am   INFECTIOUS  Lab 05/11/12 0420 05/10/12 0442 05/09/12 1533  PROCALCITON -- -- --  WBC 12.2* 12.8* 9.9  LATICACIDVEN -- -- --    A:  No evidence infection P:   Monitor CBC and fever curve  ENDOCRINE CBG (last 3)   Lab 05/10/12 2311 05/10/12  1936 05/10/12 1148  GLUCAP 111* 118* 139*    A: Hyperglycemia  Excellent glycemic control P:   SSI ensure, needed  NEUROLOGIC  A:   SAH, s/p coiling, at risk vasospasm, Early infarct area on CT?, early vasospasm? Headache Extubated on 11/2. Has short-term memory deficits. P:   Nimodipine x 21 days Bolus , eval headache, add saline at 75, pos balance goal 1 liter Early initiation HHH, risk typical at day 3, but is there an early infarct on CT Consider MRI of brain, assess cva, would prompt more aggressive HHH etc MAp goals met, 80-105 Low threshold cvp Early TCD to eval and compare repeat in am, M, w, f OOB PT/OT  Summary statement:  S/p coil. Extubated on 11/2. Does she have early ischemic area. She is a high risk for vasospasm,  Lean toward earlier intiation HHH. If we start this, we will Likely place line. For now we will continue current therapy in the intensive care, including goal MAP greater than 80, continue nimodipine, and allow her to mobilize, start PT/OT, and watch diligently for vasospasm. MRI brain consideration.  I have personally obtained a history, examined the patient, evaluated laboratory and imaging results, formulated the assessment and plan and placed orders.  CRITICAL CARE: The patient is critically ill with multiple organ systems failure and requires high complexity decision making for assessment and support, frequent evaluation and titration of therapies, application of advanced monitoring technologies and extensive interpretation of multiple databases. Critical Care Time devoted to patient care services described in this note is 30 minutes.   05/11/2012, 10:16 AM  Mcarthur Rossetti. Tyson Alias, MD, FACP Pgr: [phone] [facility]

## 2012-05-11 NOTE — Evaluation (Addendum)
Occupational Therapy Evaluation Patient Details Name: Shannon Yu MRN: [id number] DOB: [birth year] Today's Date: 05/11/2012 Time: 2841-3244 OT Time Calculation (min): 18 min  OT Assessment / Plan / Recommendation Clinical Impression  Pt admitted with [facility] and is now s/p endovascular coiling with near complete obliteration thus affecting PLOF. Will benefit from acute OT services address below problem list in prep for d/c home.    OT Assessment  Patient needs continued OT Services    Follow Up Recommendations  Home health OT;Supervision/Assistance - 24 hour    Barriers to Discharge      Equipment Recommendations  Tub/shower seat    Recommendations for Other Services    Frequency  Min 2X/week    Precautions / Restrictions Precautions Precautions: Fall Restrictions Weight Bearing Restrictions: No   Pertinent Vitals/Pain BP stable througout     ADL  Upper Body Dressing: Performed;Minimal assistance Where Assessed - Upper Body Dressing: Unsupported sitting Lower Body Dressing: Performed;Maximal assistance Where Assessed - Lower Body Dressing: Unsupported sitting Toilet Transfer: Simulated;Minimal assistance Toilet Transfer Method: Stand pivot Equipment Used: Gait belt Transfers/Ambulation Related to ADLs: Min assist for stand pivot from bed to chair. Pt declining further ambulation. ADL Comments: Pt dizzy throughout session (BP stable throughout). Pt able to cross ankles over knees when attempting to don socks, but then reported she just couldn't do it and uncrossed her legs.      OT Diagnosis: Generalized weakness  OT Problem List: Decreased activity tolerance;Decreased strength;Impaired balance (sitting and/or standing);Decreased knowledge of use of DME or AE OT Treatment Interventions: Self-care/ADL training;DME and/or AE instruction;Therapeutic activities;Patient/family education;Balance training   OT Goals Acute Rehab OT Goals OT Goal Formulation: With patient Time  For Goal Achievement: 05/25/12 Potential to Achieve Goals: Good ADL Goals Pt Will Perform Grooming: with modified independence;Standing at sink ADL Goal: Grooming - Progress: Goal set today Pt Will Perform Upper Body Bathing: with modified independence;Sitting, chair;Sitting, edge of bed ADL Goal: Upper Body Bathing - Progress: Goal set today Pt Will Perform Lower Body Bathing: with modified independence;Sit to stand from chair;Sit to stand from bed ADL Goal: Lower Body Bathing - Progress: Goal set today Pt Will Perform Upper Body Dressing: with modified independence;Sitting, chair;Sitting, bed ADL Goal: Upper Body Dressing - Progress: Goal set today Pt Will Perform Lower Body Dressing: with modified independence;Sit to stand from chair;Sit to stand from bed ADL Goal: Lower Body Dressing - Progress: Goal set today Pt Will Transfer to Toilet: with modified independence;Ambulation;Comfort height toilet ADL Goal: Toilet Transfer - Progress: Goal set today Pt Will Perform Toileting - Clothing Manipulation: with modified independence;Sitting on 3-in-1 or toilet;Standing ADL Goal: Toileting - Clothing Manipulation - Progress: Goal set today Pt Will Perform Toileting - Hygiene: with modified independence;Sit to stand from 3-in-1/toilet;Standing at 3-in-1/toilet ADL Goal: Toileting - Hygiene - Progress: Goal set today Pt Will Perform Tub/Shower Transfer: Tub transfer;with modified independence;Ambulation;Shower seat with back ADL Goal: Web designer - Progress: Goal set today Miscellaneous OT Goals Miscellaneous OT Goal #1: Pt will perform bed mobility with mod I in prep for EOB ADLs. OT Goal: Miscellaneous Goal #1 - Progress: Goal set today Miscellaneous OT Goal #2: Pt will tolerate >15 min of ADL activity with 1 seated rest break as needed.  OT Goal: Miscellaneous Goal #2 - Progress: Goal set today  Visit Information  Last OT Received On: 05/11/12 Assistance Needed: +1 PT/OT  Co-Evaluation/Treatment: Yes    Subjective Data      Prior Functioning     Home Living  Lives With: Spouse Available Help at Discharge: Family;Available PRN/intermittently Type of Home: House Home Access: Level entry Home Layout: Multi-level;Able to live on main level with bedroom/bathroom Bathroom Shower/Tub: Tub/shower unit;Curtain Bathroom Toilet: Handicapped height Bathroom Accessibility: Yes How Accessible: Accessible via walker Home Adaptive Equipment: None Prior Function Level of Independence: Independent Able to Take Stairs?: Yes Driving: Yes Vocation: Full time employment Comments: Librarian, academic Communication: No difficulties Dominant Hand: Right         Vision/Perception     Cognition  Overall Cognitive Status: Appears within functional limits for tasks assessed/performed Arousal/Alertness: Awake/alert Orientation Level: Appears intact for tasks assessed Behavior During Session: Flat affect    Extremity/Trunk Assessment Right Upper Extremity Assessment RUE ROM/Strength/Tone: WFL for tasks assessed Left Upper Extremity Assessment LUE ROM/Strength/Tone: WFL for tasks assessed     Mobility Bed Mobility Bed Mobility: Supine to Sit;Sitting - Scoot to Edge of Bed Supine to Sit: 4: Min assist;With rails Sitting - Scoot to [place] of Bed: 4: Min guard Details for Bed Mobility Assistance: Assist to support trunk OOB Transfers Transfers: Sit to Stand;Stand to Sit Sit to Stand: 4: Min assist;From bed;With upper extremity assist Stand to Sit: 4: Min guard;With armrests;To chair/3-in-1 Details for Transfer Assistance: Min assist for steadying.      Shoulder Instructions     Exercise     Balance Balance Balance Assessed: Yes Static Sitting Balance Static Sitting - Balance Support: Feet supported Static Sitting - Level of Assistance: 5: Stand by assistance Static Sitting - Comment/# of Minutes: 5 minutes EOB Dynamic Sitting  Balance Dynamic Sitting - Balance Support: During functional activity;Feet supported Dynamic Sitting - Level of Assistance:  (min guard) Dynamic Sitting Balance - Compensations: guarding for safety while pt attempted to don socks at EOB   End of Session OT - End of Session Equipment Utilized During Treatment: Gait belt Activity Tolerance: Patient limited by fatigue (dizziness) Patient left: in chair;with call bell/phone within reach;with family/visitor present Nurse Communication: Mobility status  GO   05/11/2012 Cipriano Mile OTR/L Pager [phone] Office [phone]   Cipriano Mile 05/11/2012, 3:15 PM

## 2012-05-11 NOTE — Evaluation (Signed)
Speech Language Pathology Evaluation Patient Details Name: Shannon Yu MRN: [id number] DOB: 10-23-1955 Today's Date: 05/11/2012 Time: 1000-1030 SLP Time Calculation (min): 30 min  Problem List:  Patient Active Problem List  Diagnosis  . ALLERGIC RHINITIS  . Obstructive sleep apnea  . Acute respiratory failure with hypoxia  . SAH (subarachnoid hemorrhage)   Past Medical History:  Past Medical History  Diagnosis Date  . Menopause   . Environmental allergies   . Migraine    Past Surgical History:  Past Surgical History  Procedure Date  . Knee arthroscopy     Left   HPI:  Shannon Yu presented to [facility] with headache but transferred to [facility] when CT showed SAH.  S/p endovascular coiling with near complete obliteration. Intubated 05/09/12 to 05/10/12.  Referred for Cognitive Linguistic evaluation per stroke protocol.    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  Patient to benefit from Cognitive Linguistic Evaluation in outpatient setting as patient's goal is to return to current employment.  ST to sign off as patient's cognitive skills functional in acute  care setting.     SLP Assessment  All further Speech Lanaguage Pathology  needs can be addressed in the next venue of care    Follow Up Recommendations  Outpatient SLP                SLP Evaluation Prior Functioning  Cognitive/Linguistic Baseline: Within functional limits Type of Home: House Lives With: Spouse Available Help at Discharge: Family;Available PRN/intermittently Education: College degree Vocation: Full time employment   Cognition  Overall Cognitive Status: Impaired Arousal/Alertness: Awake/alert Orientation Level: Oriented X4 Attention: Alternating Alternating Attention: Impaired Alternating Attention Impairment: Verbal complex;Functional complex Memory: Appears intact Awareness: Appears  intact Problem Solving: Impaired Problem Solving Impairment: Verbal complex;Functional complex Safety/Judgment: Appears intact    Comprehension  Auditory Comprehension Overall Auditory Comprehension: Appears within functional limits for tasks assessed Visual Recognition/Discrimination Discrimination: Within Function Limits Reading Comprehension Reading Status: Within funtional limits    Expression Expression Primary Mode of Expression: Verbal Verbal Expression Overall Verbal Expression: Appears within functional limits for tasks assessed   Oral / Motor Oral Motor/Sensory Function Overall Oral Motor/Sensory Function: Appears within functional limits for tasks assessed Motor Speech Overall Motor Speech: Appears within functional limits for tasks assessed   GO    Moreen Fowler M.S., CCC-SLP [phone] [facility] 05/11/2012, 12:48 PM

## 2012-05-12 DIAGNOSIS — I67848 Other cerebrovascular vasospasm and vasoconstriction: Secondary | ICD-10-CM | POA: Diagnosis not present

## 2012-05-12 LAB — GLUCOSE, CAPILLARY
Glucose-Capillary: 108 mg/dL — ABNORMAL HIGH (ref 70–99)
Glucose-Capillary: 116 mg/dL — ABNORMAL HIGH (ref 70–99)
Glucose-Capillary: 96 mg/dL (ref 70–99)

## 2012-05-12 LAB — COMPREHENSIVE METABOLIC PANEL
ALT: 12 U/L (ref 0–35)
AST: 14 U/L (ref 0–37)
Albumin: 3.2 g/dL — ABNORMAL LOW (ref 3.5–5.2)
Alkaline Phosphatase: 62 U/L (ref 39–117)
CO2: 25 mEq/L (ref 19–32)
Chloride: 104 mEq/L (ref 96–112)
GFR calc non Af Amer: >90 mL/min (ref >90)
Potassium: 3 mEq/L — ABNORMAL LOW (ref 3.5–5.1)
Sodium: 139 mEq/L (ref 135–145)
Total Bilirubin: 0.3 mg/dL (ref 0.3–1.2)

## 2012-05-12 LAB — CBC
MCV: 94.2 fL (ref 78.0–100.0)
Platelets: 184 10*3/uL (ref 150–400)
RBC: 3.81 MIL/uL — ABNORMAL LOW (ref 3.87–5.11)
RDW: 12.1 % (ref 11.5–15.5)
WBC: 11 10*3/uL — ABNORMAL HIGH (ref 4.0–10.5)

## 2012-05-12 MED ORDER — PANTOPRAZOLE SODIUM 40 MG PO TBEC
40.0000 mg | DELAYED_RELEASE_TABLET | Freq: Every day | ORAL | Status: DC
  Administered 2012-05-12 – 2012-05-26 (×15): 40 mg via ORAL
  Filled 2012-05-12 (×17): qty 1

## 2012-05-12 MED ORDER — POTASSIUM CHLORIDE CRYS ER 20 MEQ PO TBCR
40.0000 meq | EXTENDED_RELEASE_TABLET | ORAL | Status: AC
  Administered 2012-05-12 (×2): 40 meq via ORAL
  Filled 2012-05-12 (×2): qty 2

## 2012-05-12 MED ORDER — LABETALOL HCL 5 MG/ML IV SOLN: 10.0000 mg | INTRAVENOUS | Status: DC | PRN

## 2012-05-12 MED ORDER — OXYCODONE-ACETAMINOPHEN 5-325 MG PO TABS
1.0000 | ORAL_TABLET | ORAL | Status: DC | PRN
  Administered 2012-05-12: 2 via ORAL
  Administered 2012-05-12 (×3): 1 via ORAL
  Filled 2012-05-12 (×2): qty 1
  Filled 2012-05-12: qty 2
  Filled 2012-05-12: qty 1

## 2012-05-12 MED ORDER — POTASSIUM CHLORIDE 20 MEQ/15ML (10%) PO LIQD: 40.0000 meq | ORAL | Status: DC

## 2012-05-12 MED ORDER — POTASSIUM CHLORIDE IN NACL 20-0.9 MEQ/L-% IV SOLN
INTRAVENOUS | Status: DC
  Administered 2012-05-12 – 2012-05-13 (×2): via INTRAVENOUS
  Administered 2012-05-14 – 2012-05-16 (×3): 75 mL/h via INTRAVENOUS
  Administered 2012-05-16 – 2012-05-18 (×3): via INTRAVENOUS
  Administered 2012-05-18: 75 mL/h via INTRAVENOUS
  Administered 2012-05-19 – 2012-05-24 (×4): via INTRAVENOUS
  Filled 2012-05-12 (×28): qty 1000

## 2012-05-12 MED ORDER — HYDROMORPHONE HCL PF 1 MG/ML IJ SOLN
0.5000 mg | INTRAMUSCULAR | Status: DC | PRN
  Administered 2012-05-12 – 2012-05-14 (×13): 0.5 mg via INTRAVENOUS
  Filled 2012-05-12 (×13): qty 1

## 2012-05-12 NOTE — Progress Notes (Signed)
Patient ID: Shannon Yu, female   DOB: Jun 27, 1956, 56 y.o.   MRN: [id number] Stable, decrease of headChe. Continue as per CCM

## 2012-05-12 NOTE — Progress Notes (Signed)
PCCM PROGRESS NOTE    Name: Shannon Yu MRN: [id number] DOB: 1955-08-26    LOS: 3  REFERRING PRIVIDER:  Dr. Jeral Fruit CHIEF COMPLAINT:  Headache, SAH   Brief patient description: [name] present to [facility] reg hospital with headache.  Sudden in nature, different in character than her migraines.  [facility] showed SAH. 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  Status post endovascular coiling with near complete obliteration on 11/1  Lines/tubes: ETT 11/01 >> 11/02 A line 11/1 left rad >> 11/03  Cultures: MRSA screen 11/1>>>neg  Antibiotics:   Significant studies procedures and events:  Ct head 11/1[facility] 2 11/1- coiled anuerysm basilar  Level of Care:  ICU Primary Service:  NS Consultants:  PCCM, IR Code Status:  Full Diet:  NPO DVT Px:  scd GI Px:  PPI  Subjective: No new complaints  Objective: Vital Signs: Temp:  [98.5 F (36.9 C)-98.9 F (37.2 C)] 98.5 F (36.9 C) (11/04 1207) Pulse Rate:  [54-89] 66  (11/04 1000) Resp:  [10-19] 14  (11/04 1000) BP: (109-156)/(55-98) 156/82 mmHg (11/04 1000) SpO2:  [95 %-100 %] 100 % (11/04 1000) Room air  Physical Examination: General:  RASS 0, + F/C, cognition intact Neuro:  Nonfocal HEENT: WNL Cardiovascular:  RRR  Lungs:  clear Abdomen:  Soft, bs wnl , no r Musculoskeletal:  No edema  BMET    Component Value Date/Time   NA 139 05/12/2012 0550   K 3.0* 05/12/2012 0550   CL 104 05/12/2012 0550   CO2 25 05/12/2012 0550   GLUCOSE 103* 05/12/2012 0550   BUN 9 05/12/2012 0550   CREATININE 0.52 05/12/2012 0550   CALCIUM 8.5 05/12/2012 0550   GFRNONAA >90 05/12/2012 0550   GFRAA >90 05/12/2012 0550    CBC    Component Value Date/Time   WBC 11.0* 05/12/2012 0550   RBC 3.81* 05/12/2012 0550   HGB 12.4 05/12/2012 0550   HCT 35.9* 05/12/2012 0550   PLT 184 05/12/2012 0550   MCV 94.2 05/12/2012 0550   MCH 32.5 05/12/2012 0550   MCHC 34.5 05/12/2012 0550   RDW 12.1 05/12/2012 0550     LYMPHSABS 1.7 05/11/2012 0420   MONOABS 0.7 05/11/2012 0420   EOSABS 0.0 05/11/2012 0420   BASOSABS 0.0 05/11/2012 0420    CXR: no new film  DIAGNOSES: Active Problems:  Acute respiratory failure with hypoxia  SAH (subarachnoid hemorrhage)  Vasospasm of cerebral artery     PLAN: Watch in ICU  Cont nimodipine Serial TCDs Keep SBP 140-160 mmHg range Cont diet, PT/OT, etc  Billy Fischer, MD ; [facility] service Mobile [phone].  After 5:30 PM or weekends, call [phone]

## 2012-05-12 NOTE — Progress Notes (Signed)
UR COMPLETED  

## 2012-05-12 NOTE — Progress Notes (Signed)
VASCULAR LAB PRELIMINARY  PRELIMINARY  PRELIMINARY  PRELIMINARY  Transcranial Doppler  Date POD PCO2 HCT BP  MCA ACA PCA OPHT SIPH VERT Basilar  05-10-12 HC 0 35.3@0235  35.6@0442  118/54@0758  Right  Left   38  19   -25  -19   24  20   12  14   28  28    -33  -20   -36      05/12/12 VS 2    Right  Left   39  37   -34  -54   44  36   17  15   40  51   -29  -29   -31           Right  Left                                             Right  Left                                             Right  Left                                            Right  Left                                            Right  Left                                        MCA = Middle Cerebral Artery      OPHT = Opthalmic Artery     BASILAR = Basilar Artery   ACA = Anterior Cerebral Artery     SIPH = Carotid Siphon PCA = Posterior Cerebral Artery   VERT = Verterbral Artery                   Normal MCA = 62+\-12 ACA = 50+\-12 PCA = 42+\-23         Sirr Kabel, VS 05/12/2012, 6:41 PM

## 2012-05-12 NOTE — Progress Notes (Signed)
Physical Therapy Treatment Patient Details Name: Shannon Yu MRN: [id number] DOB: [birth year] Today's Date: 05/12/2012 Time: 4098-1191 PT Time Calculation (min): 28 min  PT Assessment / Plan / Recommendation Comments on Treatment Session  Pt able to increase ambulation distance however continues to c/o dizziness and headache.  BP maintained through out session.  Pt impulsive at times and needed cues to slow down.  Pt with previous left knee buckle due to arthritis and would benefit from [facility] intially until improved strength.  Pt did report pervious falls due to left knee buckle.      Follow Up Recommendations  Home health PT (may not need depending on progress will cont assess)     Does the patient have the potential to tolerate intense rehabilitation     Barriers to Discharge  none      Equipment Recommendations  Cane    Recommendations for Other Services  (none)  Frequency Min 4X/week   Plan Discharge plan needs to be updated;Frequency needs to be updated    Precautions / Restrictions Precautions Precautions: Fall Restrictions Weight Bearing Restrictions: No   Pertinent Vitals/Pain 3/10 headache    Mobility  Bed Mobility Bed Mobility: Supine to Sit;Sitting - Scoot to Edge of Bed Supine to Sit: 4: Min guard Sitting - Scoot to [place] of Bed: 4: Min guard Details for Bed Mobility Assistance: minguard for safety with cues for proper technique Transfers Transfers: Sit to Stand;Stand to Sit Sit to Stand: 4: Min assist;From bed Stand to Sit: 4: Min assist;To chair/3-in-1 Details for Transfer Assistance: (A) to initiate transfer and maintain balance.  Pt very guarded and c/o mild dizziness Ambulation/Gait Ambulation/Gait Assistance: 4: Min assist Ambulation Distance (Feet): 60 Feet Assistive device: 1 person hand held assist Ambulation/Gait Assistance Details: (A) to maintain balance with guarded gait.  Pt with left knee buckle that increases with fatigue. Pt reports prior left  knee buckle and occasional falls due to arthritis.  "I'm suppose to have a knee replacement."  Pt may benefit from [facility] to improve independence. Gait Pattern: Step-through pattern;Shuffle;Step-to pattern;Decreased stance time - left;Antalgic Stairs: No    Exercises     PT Diagnosis:    PT Problem List:   PT Treatment Interventions:     PT Goals Acute Rehab PT Goals PT Goal Formulation: With patient Time For Goal Achievement: 05/25/12 Potential to Achieve Goals: Good Pt will go Sit to Stand: with modified independence PT Goal: Sit to Stand - Progress: Progressing toward goal Pt will go Stand to Sit: with modified independence PT Goal: Stand to Sit - Progress: Progressing toward goal Pt will Transfer Bed to Chair/Chair to Bed: with modified independence PT Transfer Goal: Bed to Chair/Chair to Bed - Progress: Progressing toward goal Pt will Ambulate: >150 feet;with modified independence;with least restrictive assistive device PT Goal: Ambulate - Progress: Progressing toward goal  Visit Information  Last PT Received On: 05/12/12 Assistance Needed: +1    Subjective Data  Subjective: "I'm doing better but I have a headache." Patient Stated Goal: to be able to go home normal   Cognition  Overall Cognitive Status: Appears within functional limits for tasks assessed/performed Arousal/Alertness: Awake/alert Orientation Level: Appears intact for tasks assessed Behavior During Session: Flat affect Cognition - Other Comments: Pt slightly impuslive and needs cues to slow down.    Balance  Balance Balance Assessed: Yes Static Sitting Balance Static Sitting - Balance Support: Feet supported Static Sitting - Level of Assistance: 5: Stand by assistance Static Sitting - Comment/# of  Minutes: ~ 5 minutes prior to transfer.  Pt impulsive at times and needs cues to slow down. Dynamic Sitting Balance Dynamic Sitting - Balance Support: During functional activity;Feet supported Dynamic Sitting  - Level of Assistance: 5: Stand by assistance Dynamic Sitting Balance - Compensations: [place] for safety while pt washed face and brushed teeth.  Pt needed extra time to complete task.  End of Session PT - End of Session Equipment Utilized During Treatment: Gait belt Activity Tolerance: Patient limited by fatigue Patient left: in chair;with call bell/phone within reach;with family/visitor present;with nursing in room Nurse Communication: Mobility status   GP     Mauriah Mcmillen 05/12/2012, 10:29 AM Jake Shark, PT DPT [phone]

## 2012-05-12 NOTE — Progress Notes (Signed)
  Subjective: Basilar artery aneurysm coil 11/2 early am Pt resting Headache frontal area No N/V   Objective: Vital signs in last 24 hours: Temp:  [98.8 F (37.1 C)-98.9 F (37.2 C)] 98.9 F (37.2 C) (11/04 0800) Pulse Rate:  [54-89] 66  (11/04 1000) Resp:  [10-19] 14  (11/04 1000) BP: (109-156)/(55-98) 156/82 mmHg (11/04 1000) SpO2:  [95 %-100 %] 100 % (11/04 1000)    Intake/Output from previous day: 11/03 0701 - 11/04 0700 In: 2658.8 [P.O.:360; I.V.:1298.8; IV Piggyback:500] Out: 1550 [Urine:1550] Intake/Output this shift: Total I/O In: 465 [P.O.:240; I.V.:225] Out: 400 [Urine:400]  PE:  Afeb; VSS Face symm; smile = Speaking plainly; pleasant A/O Moves all 4s; good strength Rt groin NT; no bleeding; no hematoma Rt foot 2+ pulses   Lab Results:   Basename 05/12/12 0550 05/11/12 0420  WBC 11.0* 12.2*  HGB 12.4 12.0  HCT 35.9* 35.3*  PLT 184 191   BMET  Basename 05/12/12 0550 05/11/12 1440  NA 139 136  K 3.0* 3.3*  CL 104 103  CO2 25 25  GLUCOSE 103* 115*  BUN 9 9  CREATININE 0.52 0.52  CALCIUM 8.5 8.5   PT/INR  Basename 05/09/12 1533  LABPROT 13.0  INR 0.99   ABG  Basename 05/10/12 0235  PHART 7.363  HCO3 19.5*    Studies/Results: Dg Chest Port 1 View  05/11/2012  *RADIOLOGY REPORT*  Clinical Data: Cough, congestion  PORTABLE CHEST - 1 VIEW  Comparison: 05/10/2012  Findings: The patient has been extubated.  NG tube also removed. Normal heart size and vascularity.  Persistent medial right lower lobe atelectasis.  Left lung clear.  No effusion or pneumothorax. Trachea is midline.  IMPRESSION: Extubated.  Residual medial right base atelectasis.   Original Report Authenticated By: Judie Petit. Miles Costain, M.D.     Anti-infectives: Anti-infectives    None      Assessment/Plan: s/p * No surgery found *   LOS: 3 days  Basilar art aneurysm coil 11/2 early am Pt doing well Alert; sl headache Will report to [facility]  Gigi Onstad A 05/12/2012

## 2012-05-13 ENCOUNTER — Encounter (HOSPITAL_COMMUNITY): Payer: Self-pay | Admitting: Radiology

## 2012-05-13 ENCOUNTER — Inpatient Hospital Stay (HOSPITAL_COMMUNITY): Payer: Commercial Managed Care - PPO

## 2012-05-13 DIAGNOSIS — G459 Transient cerebral ischemic attack, unspecified: Secondary | ICD-10-CM

## 2012-05-13 MED ORDER — HYDROCODONE-ACETAMINOPHEN 10-325 MG PO TABS
1.0000 | ORAL_TABLET | ORAL | Status: DC | PRN
  Administered 2012-05-13 – 2012-05-21 (×42): 1 via ORAL
  Filled 2012-05-13 (×42): qty 1

## 2012-05-13 NOTE — Progress Notes (Signed)
  Subjective: Basilar artery aneurysm coiling performed 11/2 Pt still with bad headache; nausea No vomit Resting; has been up to chair today  Objective: Vital signs in last 24 hours: Temp:  [98.1 F (36.7 C)-99.7 F (37.6 C)] 99.7 F (37.6 C) (11/05 1100) Pulse Rate:  [58-84] 70  (11/05 1300) Resp:  [10-18] 11  (11/05 1300) BP: (118-161)/(59-95) 159/85 mmHg (11/05 1200) SpO2:  [94 %-100 %] 96 % (11/05 1300)    Intake/Output from previous day: 11/04 0701 - 11/05 0700 In: 2027.5 [P.O.:360; I.V.:1667.5] Out: 2850 [Urine:2850] Intake/Output this shift: Total I/O In: 246 [P.O.:240; IV Piggyback:6] Out: -   PE:  99.7; VSS A/O; appropriate Moves all 4s  Lab Results:   [facility] 05/12/12 0550 05/11/12 0420  WBC 11.0* 12.2*  HGB 12.4 12.0  HCT 35.9* 35.3*  PLT 184 191   BMET  Basename 05/12/12 0550 05/11/12 1440  NA 139 136  K 3.0* 3.3*  CL 104 103  CO2 25 25  GLUCOSE 103* 115*  BUN 9 9  CREATININE 0.52 0.52  CALCIUM 8.5 8.5   PT/INR No results found for this basename: LABPROT:2,INR:2 in the last 72 hours ABG No results found for this basename: PHART:2,PCO2:2,PO2:2,HCO3:2 in the last 72 hours  Studies/Results: Ct Head Wo Contrast  05/13/2012  *RADIOLOGY REPORT*  Clinical Data: Status post aneurysm coiling  CT HEAD WITHOUT CONTRAST  Technique:  Contiguous axial images were obtained from the base of the skull through the vertex without contrast.  Comparison: Multiple priors.  Most recent CT 05/10/2012  Findings: The ventricular size is decreased.  There is slight residual subarachnoid blood and intraventricular blood.  There is no visible infarction.  Aneurysm coils appears stable.  No significant sinus or mastoid disease.  IMPRESSION: Improved appearance status post basilar tip aneurysm coiling. Overall ventricular size is decreasing, and there is no visible cortical infarction.   Original Report Authenticated By: Davonna Belling, M.D.      Anti-infectives: Anti-infectives    None      Assessment/Plan: s/p * No surgery found *   LOS: 4 days  Basilar artery aneurysm coil 11/2 Bad headache Otherwise resting  Ellysia Char A 05/13/2012

## 2012-05-13 NOTE — Progress Notes (Signed)
Patient ID: Shannon Yu, female   DOB: 12-16-1955, 56 y.o.   MRN: [id number] Doing well, no weakness. Vs wnl. Wants to be off percocet

## 2012-05-13 NOTE — Progress Notes (Signed)
Occupational Therapy Treatment Patient Details Name: EMUNAH ARON MRN: [id number] DOB: 1956/02/03 Today's Date: 05/13/2012 Time: 0935-1000 OT Time Calculation (min): 25 min  OT Assessment / Plan / Recommendation Comments on Treatment Session Pt progressing with therapy but continues to be limited by dizziness and nausea.    Follow Up Recommendations       Barriers to Discharge       Equipment Recommendations  Tub/shower seat    Recommendations for Other Services    Frequency     Plan      Precautions / Restrictions Precautions Precautions: Fall Restrictions Weight Bearing Restrictions: No   Pertinent Vitals/Pain Pt reports headache and nausea- did not rate. RN aware.    ADL  Grooming: Wash/dry hands;Supervision/safety Where Assessed - Grooming: Unsupported standing Lower Body Dressing: Min guard Where Assessed - Lower Body Dressing: Unsupported sit to stand Toilet Transfer: Min Pension scheme manager Method: Sit to Barista: Regular height toilet;Grab bars Toileting - Architect and Hygiene: Min guard Where Assessed - Engineer, mining and Hygiene: Sit to stand from 3-in-1 or toilet Equipment Used: Gait belt Transfers/Ambulation Related to ADLs: Min guard A with ambulation in the room with no AD. Pt with no LOB but reports dizziness esp during turns. Educated pt on gaze stabilization ADL Comments: educated pt on gaze stabilization, esp during turns. Pt was able to read with no issues- will continue to assess as pt is able tolerate    OT Diagnosis:    OT Problem List:   OT Treatment Interventions:     OT Goals ADL Goals ADL Goal: Grooming - Progress: Progressing toward goals ADL Goal: Lower Body Dressing - Progress: Progressing toward goals ADL Goal: Toilet Transfer - Progress: Progressing toward goals ADL Goal: Toileting - Clothing Manipulation - Progress: Progressing toward goals ADL Goal: Toileting - Hygiene -  Progress: Progressing toward goals Miscellaneous OT Goals OT Goal: Miscellaneous Goal #1 - Progress: Progressing toward goals OT Goal: Miscellaneous Goal #2 - Progress: Progressing toward goals  Visit Information  Last OT Received On: 05/13/12 Assistance Needed: +1    Subjective Data      Prior Functioning       Cognition  Overall Cognitive Status: Appears within functional limits for tasks assessed/performed Arousal/Alertness: Awake/alert Orientation Level: Appears intact for tasks assessed Behavior During Session: [facility] for tasks performed    Mobility  Shoulder Instructions Bed Mobility Supine to Sit: 4: Min guard Sitting - Scoot to Edge of Bed: 4: Min guard Details for Bed Mobility Assistance: Min guard for safety and cues for sequencing Transfers Sit to Stand: From bed;4: Min guard;From toilet Stand to Sit: To chair/3-in-1;4: Min guard;To toilet Details for Transfer Assistance: min guard for safety       Exercises      Balance     End of Session OT - End of Session Equipment Utilized During Treatment: Gait belt Activity Tolerance: Patient limited by pain (and dizziness) Patient left: in chair;with call bell/phone within reach;with family/visitor present Nurse Communication: Mobility status  GO     Gertrude Tarbet 05/13/2012, 10:19 AM

## 2012-05-13 NOTE — Progress Notes (Signed)
Physical Therapy Treatment Patient Details Name: Shannon Yu MRN: [id number] DOB: 1956-04-20 Today's Date: 05/13/2012 Time: 3086-5784 PT Time Calculation (min): 16 min  PT Assessment / Plan / Recommendation Comments on Treatment Session  Pt limited due to overall fatigue and unable to increase ambulationd distance.  Pt educated on gaze stablization to decrease overall dizziness.    Follow Up Recommendations  Home health PT     Does the patient have the potential to tolerate intense rehabilitation     Barriers to Discharge        Equipment Recommendations  Tub/shower seat    Recommendations for Other Services  (none)  Frequency Min 4X/week   Plan Discharge plan remains appropriate;Frequency remains appropriate    Precautions / Restrictions Precautions Precautions: Fall   Pertinent Vitals/Pain C/o head 7/10     Mobility  Bed Mobility Bed Mobility: Supine to Sit;Sitting - Scoot to Edge of Bed Supine to Sit: 4: Min guard Sitting - Scoot to [place] of Bed: 4: Min guard Details for Bed Mobility Assistance: Min guard for safety and cues for sequencing Transfers Transfers: Sit to Stand;Stand to Sit Sit to Stand: From bed;4: Min guard;From toilet Stand to Sit: To chair/3-in-1;4: Min guard;To toilet Details for Transfer Assistance: Minguard for safety with cues for hand placement Ambulation/Gait Ambulation/Gait Assistance: 4: Min guard Ambulation Distance (Feet): 20 Feet Assistive device: None Ambulation/Gait Assistance Details: mInguard for safety with cues for gaze stablization.   Gait Pattern: Step-through pattern;Shuffle;Step-to pattern;Decreased stance time - left;Antalgic General Gait Details: guarded gait and slow cadence; educated on gaze stablization and slow overall mobility to prevent dizziness Stairs: No    Exercises     PT Diagnosis:    PT Problem List:   PT Treatment Interventions:     PT Goals Acute Rehab PT Goals PT Goal Formulation: With patient Time  For Goal Achievement: 05/25/12 Potential to Achieve Goals: Good Pt will go Sit to Stand: with modified independence PT Goal: Sit to Stand - Progress: Progressing toward goal Pt will go Stand to Sit: with modified independence PT Goal: Stand to Sit - Progress: Progressing toward goal Pt will Transfer Bed to Chair/Chair to Bed: with modified independence PT Transfer Goal: Bed to Chair/Chair to Bed - Progress: Progressing toward goal Pt will Ambulate: >150 feet;with modified independence;with least restrictive assistive device PT Goal: Ambulate - Progress: Progressing toward goal  Visit Information  Last PT Received On: 05/13/12 Assistance Needed: +1    Subjective Data  Subjective: "I'm not feeling to good today." Patient Stated Goal: to be able to go home normal   Cognition  Overall Cognitive Status: Appears within functional limits for tasks assessed/performed Arousal/Alertness: Awake/alert Orientation Level: Appears intact for tasks assessed Behavior During Session: [facility] for tasks performed    Balance  Balance Balance Assessed: Yes Static Sitting Balance Static Sitting - Balance Support: Feet supported Static Sitting - Level of Assistance: 5: Stand by assistance Dynamic Sitting Balance Dynamic Sitting - Balance Support: During functional activity;Feet supported Dynamic Sitting - Level of Assistance: 5: Stand by assistance Dynamic Sitting Balance - Compensations: [place] for safety while pt washed face and brushed teeth.  Pt needed extra time to complete task.  End of Session PT - End of Session Equipment Utilized During Treatment: Gait belt Activity Tolerance: Patient limited by fatigue Patient left: in chair;with call bell/phone within reach;with family/visitor present;with nursing in room Nurse Communication: Mobility status   GP     Olvin Rohr 05/13/2012, 2:08 PM Jake Shark, PT DPT [phone]

## 2012-05-13 NOTE — Progress Notes (Signed)
PCCM PROGRESS NOTE    Name: Shannon Yu MRN: [id number] DOB: 04-07-1956    LOS: 4  REFERRING PRIVIDER:  Dr. Jeral Fruit CHIEF COMPLAINT:  Headache, SAH   Brief patient description: [name] present to [facility] reg hospital with headache.  Sudden in nature, different in character than her migraines.  Transferred to to [facility] as CT showed SAH. 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  Status post endovascular coiling with near complete obliteration on 11/1  Lines/tubes: ETT 11/01 >> 11/02 A line 11/1 left rad >> 11/03  Cultures: MRSA screen 11/1>>>neg  Antibiotics:   Significant studies procedures and events:  11/1 Ct head - SAH 11/1- coiled anuerysm basilar  Level of Care:  ICU Primary Service:  NS Consultants:  PCCM, IR Code Status:  Full Diet:  NPO DVT Px:  scd GI Px:  PPI  Subjective: No new complaints  Objective: Vital Signs: Temp:  [98.1 F (36.7 C)-99.2 F (37.3 C)] 99.2 F (37.3 C) (11/05 0700) Pulse Rate:  [55-87] 67  (11/05 0800) Resp:  [10-18] 13  (11/05 0800) BP: (118-161)/(59-95) 138/83 mmHg (11/05 0800) SpO2:  [94 %-100 %] 99 % (11/05 0800) Room air  Physical Examination: General:  RASS 0, + F/C, cognition intact Neuro:  Nonfocal HEENT: WNL Cardiovascular:  RRR  Lungs:  clear Abdomen:  Soft, bs wnl , no r Musculoskeletal:  No edema  BMET    Component Value Date/Time   NA 139 05/12/2012 0550   K 3.0* 05/12/2012 0550   CL 104 05/12/2012 0550   CO2 25 05/12/2012 0550   GLUCOSE 103* 05/12/2012 0550   BUN 9 05/12/2012 0550   CREATININE 0.52 05/12/2012 0550   CALCIUM 8.5 05/12/2012 0550   GFRNONAA >90 05/12/2012 0550   GFRAA >90 05/12/2012 0550    CBC    Component Value Date/Time   WBC 11.0* 05/12/2012 0550   RBC 3.81* 05/12/2012 0550   HGB 12.4 05/12/2012 0550   HCT 35.9* 05/12/2012 0550   PLT 184 05/12/2012 0550   MCV 94.2 05/12/2012 0550   MCH 32.5 05/12/2012 0550   MCHC 34.5 05/12/2012 0550   RDW 12.1 05/12/2012 0550     LYMPHSABS 1.7 05/11/2012 0420   MONOABS 0.7 05/11/2012 0420   EOSABS 0.0 05/11/2012 0420   BASOSABS 0.0 05/11/2012 0420    CXR: no new film  DIAGNOSES: Active Problems:  Acute respiratory failure with hypoxia  SAH (subarachnoid hemorrhage)  Vasospasm of cerebral artery     PLAN: Cont to watch in ICU until risk of vasospasm sufficiently resolved Cont nimodipine Cont serial TCDs Keep SBP 140-160 mmHg range Cont diet, PT/OT, etc  PCCM will sign off. Please call if we can be of further assistance. I have let the RN staff know that they may contact us at any time for any needs and that [employer] will remain involved in their capacity   Billy Fischer, MD ; [facility] [phone].  After 5:30 PM or weekends, call [phone]

## 2012-05-14 MED ORDER — HYDROMORPHONE HCL PF 1 MG/ML IJ SOLN
1.0000 mg | INTRAMUSCULAR | Status: DC | PRN
  Administered 2012-05-14: 1 mg via INTRAVENOUS
  Administered 2012-05-14: 2 mg via INTRAVENOUS
  Administered 2012-05-14 – 2012-05-19 (×35): 1 mg via INTRAVENOUS
  Administered 2012-05-19: 2 mg via INTRAVENOUS
  Administered 2012-05-19 – 2012-05-20 (×9): 1 mg via INTRAVENOUS
  Administered 2012-05-20: 2 mg via INTRAVENOUS
  Administered 2012-05-20 – 2012-05-21 (×4): 1 mg via INTRAVENOUS
  Filled 2012-05-14: qty 2
  Filled 2012-05-14 (×3): qty 1
  Filled 2012-05-14: qty 2
  Filled 2012-05-14 (×3): qty 1
  Filled 2012-05-14: qty 2
  Filled 2012-05-14 (×43): qty 1

## 2012-05-14 NOTE — Progress Notes (Signed)
Subjective: Patient reports headach  Objective: Vital signs in last 24 hours: Temp:  [98.3 F (36.8 C)-99.5 F (37.5 C)] 99.5 F (37.5 C) (11/06 1100) Pulse Rate:  [56-81] 75  (11/06 1200) Resp:  [11-22] 22  (11/06 1200) BP: (109-155)/(54-89) 142/75 mmHg (11/06 1200) SpO2:  [95 %-98 %] 96 % (11/06 1200)  Intake/Output from previous day: 11/05 0701 - 11/06 0700 In: 2001 [P.O.:720; I.V.:1275; IV Piggyback:6] Out: 3150 [Urine:3150] Intake/Output this shift: Total I/O In: 495 [P.O.:120; I.V.:375] Out: 900 [Urine:900]  Decrease of neck stiffness, no weakness  Continue with triple H therapy Lab Results:  [facility] 05/12/12 0550  WBC 11.0*  HGB 12.4  HCT 35.9*  PLT 184   BMET  Basename 05/12/12 0550 05/11/12 1440  NA 139 136  K 3.0* 3.3*  CL 104 103  CO2 25 25  GLUCOSE 103* 115*  BUN 9 9  CREATININE 0.52 0.52  CALCIUM 8.5 8.5    Studies/Results: Ct Head Wo Contrast  05/13/2012  *RADIOLOGY REPORT*  Clinical Data: Status post aneurysm coiling  CT HEAD WITHOUT CONTRAST  Technique:  Contiguous axial images were obtained from the base of the skull through the vertex without contrast.  Comparison: Multiple priors.  Most recent CT 05/10/2012  Findings: The ventricular size is decreased.  There is slight residual subarachnoid blood and intraventricular blood.  There is no visible infarction.  Aneurysm coils appears stable.  No significant sinus or mastoid disease.  IMPRESSION: Improved appearance status post basilar tip aneurysm coiling. Overall ventricular size is decreasing, and there is no visible cortical infarction.   Original Report Authenticated By: Davonna Belling, M.D.     Assessment/Plan:increase dilaudid  LOS: 5 days     Alwyn Cordner M 05/14/2012, 12:57 PM

## 2012-05-14 NOTE — Progress Notes (Signed)
VASCULAR LAB PRELIMINARY  PRELIMINARY  PRELIMINARY  PRELIMINARY  Transcranial Doppler  Date POD PCO2 HCT BP  MCA ACA PCA OPHT SIPH VERT Basilar  05-10-12 HC 0 35.3@0235  35.6@0442  118/54@0758  Right  Left   38  19   -25  -19   24  20   12  14   28  28    -33  -20   -36      05/12/12 VS 2    Right  Left   39  37   -34  -54   44  36   17  15   40  51   -29  -29   -31      05/14/12 vs     Right  Left   48 45     -36  -51   40  41   15  15   34  35   -32  -36   -43            Right  Left                                             Right  Left                                            Right  Left                                            Right  Left                                        MCA = Middle Cerebral Artery      OPHT = Opthalmic Artery     BASILAR = Basilar Artery   ACA = Anterior Cerebral Artery     SIPH = Carotid Siphon PCA = Posterior Cerebral Artery   VERT = Verterbral Artery                   Normal MCA = 62+\-12 ACA = 50+\-12 PCA = 42+\-23     CESTONE,HELEN RVT 05/10/12 05/12/2012, VS  Jasmeen Fritsch, VS 05/14/2012, 4:33 PM

## 2012-05-15 MED ORDER — BISACODYL 5 MG PO TBEC
10.0000 mg | DELAYED_RELEASE_TABLET | Freq: Every day | ORAL | Status: DC | PRN
  Administered 2012-05-15 – 2012-05-17 (×2): 10 mg via ORAL
  Filled 2012-05-15 (×2): qty 2

## 2012-05-15 MED ORDER — FLEET ENEMA 7-19 GM/118ML RE ENEM
1.0000 | ENEMA | Freq: Every day | RECTAL | Status: DC | PRN
  Filled 2012-05-15: qty 1

## 2012-05-15 NOTE — Progress Notes (Signed)
Subjective: Patient reports constipation  Objective: Vital signs in last 24 hours: Temp:  [98.1 F (36.7 C)-99.5 F (37.5 C)] 98.3 F (36.8 C) (11/07 0400) Pulse Rate:  [56-98] 74  (11/07 0900) Resp:  [10-23] 13  (11/07 0900) BP: (92-149)/(45-92) 134/75 mmHg (11/07 0900) SpO2:  [93 %-99 %] 98 % (11/07 0900)  Intake/Output from previous day: 11/06 0701 - 11/07 0700 In: 2640 [P.O.:840; I.V.:1800] Out: 1100 [Urine:1100] Intake/Output this shift:    Doing well. Ambulating with pt. Decrease of headache. Neuro intact  Lab Results: No results found for this basename: WBC:2,HGB:2,HCT:2,PLT:2 in the last 72 hours BMET No results found for this basename: NA:2,K:2,CL:2,CO2:2,GLUCOSE:2,BUN:2,CREATININE:2,CALCIUM:2 in the last 72 hours  Studies/Results: No results found.  Assessment/Plan: laxative  LOS: 6 days  Continue as present   Joah Patlan M 05/15/2012, 9:31 AM

## 2012-05-15 NOTE — Progress Notes (Signed)
Occupational Therapy Treatment Patient Details Name: Shannon Yu MRN: [id number] DOB: [birth year] Today's Date: 05/15/2012 Time: 4098-1191 OT Time Calculation (min): 23 min  OT Assessment / Plan / Recommendation Comments on Treatment Session Pt feeling much better today, still reports headache pain, but states is much improved.     Follow Up Recommendations  No OT follow up;Supervision/Assistance - 24 hour    Barriers to Discharge       Equipment Recommendations  None recommended by OT;None recommended by PT    Recommendations for Other Services    Frequency     Plan Discharge plan needs to be updated    Precautions / Restrictions Precautions Precautions: Fall Restrictions Weight Bearing Restrictions: No   Pertinent Vitals/Pain Pt reports headache pain but did not rate    ADL  Lower Body Dressing: Supervision/safety Where Assessed - Lower Body Dressing: Unsupported sit to stand Toilet Transfer: Supervision/safety Toilet Transfer Method: Sit to Barista: Regular height toilet Toileting - Clothing Manipulation and Hygiene: Supervision/safety Where Assessed - Engineer, mining and Hygiene: Sit to stand from 3-in-1 or toilet Tub/Shower Transfer: Supervision/safety Tub/Shower Transfer Method: Ambulating Equipment Used: Gait belt Transfers/Ambulation Related to ADLs: supervision/Min guard A with ambulation in hall and room; slightly unsteady    OT Diagnosis:    OT Problem List:   OT Treatment Interventions:     OT Goals ADL Goals ADL Goal: Grooming - Progress: Progressing toward goals ADL Goal: Lower Body Bathing - Progress: Progressing toward goals ADL Goal: Lower Body Dressing - Progress: Progressing toward goals ADL Goal: Toilet Transfer - Progress: Progressing toward goals ADL Goal: Toileting - Clothing Manipulation - Progress: Progressing toward goals ADL Goal: Toileting - Hygiene - Progress: Progressing toward goals ADL Goal:  Tub/Shower Transfer - Progress: Progressing toward goals Miscellaneous OT Goals OT Goal: Miscellaneous Goal #2 - Progress: Met  Visit Information  Last OT Received On: 05/15/12 Assistance Needed: +1    Subjective Data      Prior Functioning       Cognition  Overall Cognitive Status: Appears within functional limits for tasks assessed/performed Arousal/Alertness: Awake/alert Orientation Level: Appears intact for tasks assessed Behavior During Session: Shannon Yu for tasks performed    Mobility  Shoulder Instructions Bed Mobility Bed Mobility: Supine to Sit;Sitting - Scoot to Edge of Bed Supine to Sit: 5: Supervision Sitting - Scoot to [place] of Bed: 5: Supervision Details for Bed Mobility Assistance: Supervision for safety Transfers Sit to Stand: 4: Min guard;From bed Stand to Sit: 4: Min guard;To chair/3-in-1 Details for Transfer Assistance: Minguard for safety; pt needs extra time to complete transfers       Exercises      Balance Balance Balance Assessed: Yes Dynamic Gait Index Level Surface: Mild Impairment Change in Gait Speed: Mild Impairment Gait with Horizontal Head Turns: Mild Impairment Gait with Vertical Head Turns: Mild Impairment Gait and Pivot Turn: Mild Impairment Step Over Obstacle: Moderate Impairment Step Around Obstacles: Moderate Impairment Steps: Moderate Impairment Total Score: 13    End of Session OT - End of Session Equipment Utilized During Treatment: Gait belt Activity Tolerance: Patient limited by pain Patient left: in chair;with call bell/phone within reach;with family/visitor present Nurse Communication: Mobility status  GO     Shannon Yu 05/15/2012, 10:59 AM

## 2012-05-16 ENCOUNTER — Inpatient Hospital Stay (HOSPITAL_COMMUNITY): Payer: Commercial Managed Care - PPO

## 2012-05-16 NOTE — Progress Notes (Signed)
VASCULAR LAB PRELIMINARY  PRELIMINARY  PRELIMINARY  PRELIMINARY  Transcranial Doppler  Date POD PCO2 HCT BP  MCA ACA PCA OPHT SIPH VERT Basilar  05-10-12 HC 0 35.3@0235  35.6@0442  118/54@0758  Right  Left   38  19   -25  -19   24  20   12  14   28  28    -33  -20   -36      05/12/12 VS 2    Right  Left   39  37   -34  -54   44  36   17  15   40  51   -29  -29   -31      05/14/12 vs 4    Right  Left   48 45     -36  -51   40  41   15  15   34  35   -32  -36   -43      11-8 hc 6 No  recent No  recent 134/81 @ 1200 Right  Left   40  41   -32  -29   18  15   14  15    32  24   -24  -25   -37            Right  Left                                            Right  Left                                            Right  Left                                        MCA = Middle Cerebral Artery      OPHT = Opthalmic Artery     BASILAR = Basilar Artery   ACA = Anterior Cerebral Artery     SIPH = Carotid Siphon PCA = Posterior Cerebral Artery   VERT = Verterbral Artery                   Normal MCA = 62+\-12 ACA = 50+\-12 PCA = 42+\-23     Shannon Yu RVT 05/10/12 05/12/2012, VS  Shannon Yu, RVT 05/16/2012, 1:25 PM

## 2012-05-16 NOTE — Progress Notes (Signed)
PT Cancellation Note  Patient Details Name: Shannon Yu MRN: [id number] DOB: [birth year]   Cancelled Treatment:    Reason Eval/Treat Not Completed:  (Pt refused PT at this time.)  Pt resting in bed did not want to work with PT at this time.     [name] 05/16/2012, 2:23 PM Jake Shark, PT DPT [phone]

## 2012-05-16 NOTE — Progress Notes (Signed)
Physical Therapy Progress Note   05/15/12 1000  PT Visit Information  Last PT Received On 05/15/12  Assistance Needed +1  PT Time Calculation  PT Start Time 0904  PT Stop Time 0935  PT Time Calculation (min) 31 min  Subjective Data  Subjective "I'm feeling better today but still have headache."  "I'm afraid of stairs."  Patient Stated Goal to be able to go home normal  Precautions  Precautions Fall  Cognition  Overall Cognitive Status Appears within functional limits for tasks assessed/performed  Arousal/Alertness Awake/alert  Orientation Level Appears intact for tasks assessed  Behavior During Session [facility] for tasks performed  Bed Mobility  Bed Mobility Supine to Sit;Sitting - Scoot to Edge of Bed  Supine to Sit 5: Supervision  Sitting - Scoot to [place] of Bed 5: Supervision  Details for Bed Mobility Assistance Supervision for safety  Transfers  Transfers Sit to Stand;Stand to Sit  Sit to Stand 4: Min guard;From bed  Stand to Sit 4: Min guard;To chair/3-in-1  Details for Transfer Assistance Minguard for safety; pt needs extra time to complete transfers  Ambulation/Gait  Ambulation/Gait Assistance 4: Min guard  Ambulation Distance (Feet) 175 Feet  Assistive device None  Ambulation/Gait Assistance Details Minguard for safety.  Pt with Wide BOS and guarded gait  Gait Pattern Step-through pattern;Shuffle  Stairs Yes  Stairs Assistance 4: Min guard  Stair Management Technique One rail Left;Forwards  Number of Stairs 3   Balance  Balance Assessed Yes  Dynamic Gait Index  Level Surface 2  Change in Gait Speed 2  Gait with Horizontal Head Turns 2  Gait with Vertical Head Turns 2  Gait and Pivot Turn 2  Step Over Obstacle 1  Step Around Obstacles 1  Steps 1  Total Score 13   PT - End of Session  Equipment Utilized During Treatment Gait belt  Activity Tolerance Patient tolerated treatment well  Patient left in chair;with call bell/phone within reach;with family/visitor  present;with nursing in room  Nurse Communication Mobility status  PT - Assessment/Plan  Comments on Treatment Session Pt able to increase ambulation distance and perform DGI.  Pt scored 13/24 on DGI.  Pt very guarded with ambulation however overall improvement.  Pt will have assistance needed at home.    PT Plan Discharge plan remains appropriate;Frequency remains appropriate  PT Frequency Min 4X/week  Follow Up Recommendations Outpatient PT;Supervision/Assistance - 24 hour  Acute Rehab PT Goals  PT Goal Formulation With patient  Time For Goal Achievement 05/25/12  Potential to Achieve Goals Good  Pt will go Sit to Stand with modified independence  PT Goal: Sit to Stand - Progress Progressing toward goal  Pt will go Stand to Sit with modified independence  PT Goal: Stand to Sit - Progress Progressing toward goal  Pt will Transfer Bed to Chair/Chair to Bed with modified independence  PT Transfer Goal: Bed to Chair/Chair to Bed - Progress Progressing toward goal  Pt will Ambulate >150 feet;with modified independence;with least restrictive assistive device  PT Goal: Ambulate - Progress Progressing toward goal  Pt will Go Up / Down Stairs Flight;with supervision;with rail(s)  PT Goal: Up/Down Stairs - Progress Progressing toward goal  PT General Charges  $$ ACUTE PT VISIT 1 Procedure  PT Treatments  $Gait Training 8-22 mins  $Therapeutic Activity 8-22 mins     7/10 headache  [place], PT DPT [phone]

## 2012-05-16 NOTE — Progress Notes (Signed)
Doing well. Vital signs wnl. No weakness . Ct head today

## 2012-05-17 NOTE — Progress Notes (Signed)
Patient ID: Shannon Yu, female   DOB: 11/29/1955, 56 y.o.   MRN: [id number] Subjective: Patient reports feeling good  Objective: Vital signs in last 24 hours: Temp:  [96.3 F (35.7 C)-98.6 F (37 C)] 98.2 F (36.8 C) (11/09 0400) Pulse Rate:  [60-94] 71  (11/09 0700) Resp:  [9-18] 9  (11/09 0700) BP: (113-145)/(59-95) 127/95 mmHg (11/09 0600) SpO2:  [92 %-99 %] 99 % (11/09 0700) Weight:  [78.5 kg (173 lb 1 oz)] 78.5 kg (173 lb 1 oz) (11/09 0000)  Intake/Output from previous day: 11/08 0701 - 11/09 0700 In: 2088.8 [P.O.:480; I.V.:1608.8] Out: 1350 [Urine:1350] Intake/Output this shift:    awake, alert, conversant. No focal deficit  Lab Results: No results found for this basename: WBC:2,HGB:2,HCT:2,PLT:2 in the last 72 hours BMET No results found for this basename: NA:2,K:2,CL:2,CO2:2,GLUCOSE:2,BUN:2,CREATININE:2,CALCIUM:2 in the last 72 hours  Studies/Results: Ct Head Wo Contrast  05/16/2012  *RADIOLOGY REPORT*  Clinical Data:   56 year old female with subarachnoid hemorrhage. Ruptured aneurysm status post coiling.  CT HEAD WITHOUT CONTRAST  Technique:  Contiguous axial images were obtained from the base of the skull through the vertex without contrast.  Comparison: 05/13/2012 and earlier.  Findings: Visualized paranasal sinuses and mastoids are clear. Stable visualized osseous structures.  Stable orbits and scalp soft tissues.  Stable basilar tip coil pack.  Subsequent streak artifact.  The small volume of intraventricular hemorrhage appears further diminished.  Mild if any ventriculomegaly persists.  Sylvian fissure subarachnoid hemorrhage also has diminished.  Trace subarachnoid elsewhere is less visible.  No new intracranial hemorrhage.  Serve pass gray-white No evidence of cortically based acute infarction identified.  No midline shift, mass effect, or evidence of mass lesion.  IMPRESSION: 1.  Sequelae of basilar tip aneurysm coiling. 2.  Further decreased subarachnoid and  intraventricular hemorrhage. Mild if any ventriculomegaly.  No new intracranial abnormality.   Original Report Authenticated By: Erskine Speed, M.D.     Assessment/Plan: Doing very well. Continue present fluid treatment for a few more days. Out of ICU next 1-2 days.  LOS: 8 days  as above   Reinaldo Meeker, MD 05/17/2012, 8:36 AM

## 2012-05-18 MED ORDER — POLYETHYLENE GLYCOL 3350 17 G PO PACK
17.0000 g | PACK | Freq: Every day | ORAL | Status: DC
  Administered 2012-05-18 – 2012-05-26 (×9): 17 g via ORAL
  Filled 2012-05-18 (×10): qty 1

## 2012-05-18 NOTE — Progress Notes (Signed)
Patient ID: Shannon Yu, female   DOB: Apr 30, 1956, 56 y.o.   MRN: [id number] Doing well. No new neuro issues. Will keep in ICU til tomorrow and let Dr Jeral Fruit decide further disposition.

## 2012-05-19 ENCOUNTER — Inpatient Hospital Stay (HOSPITAL_COMMUNITY): Payer: Commercial Managed Care - PPO

## 2012-05-19 NOTE — Progress Notes (Signed)
Patient ID: Shannon Yu, female   DOB: [birth year], 56 y.o.   MRN: [id number] Alert, no neuro deficits. C/o headache around the clock. To have TCD today. Will get  A ct head

## 2012-05-19 NOTE — Progress Notes (Signed)
Physical Therapy Treatment Patient Details Name: Shannon Yu MRN: [id number] DOB: Apr 29, 1956 Today's Date: 05/19/2012 Time: 1410-1440 PT Time Calculation (min): 30 min  PT Assessment / Plan / Recommendation Comments on Treatment Session  Pt continues to make progress; increased ambulation distance and improved score on DGI to 18/24 from 13/24, suggesting that she is now at less of risk for falls, but continues to have minor balance and gait impairments.  Pt will benefit from continued therapy services to improve gait pattern and attention to left drift during ambulation.     Follow Up Recommendations  Outpatient PT;Supervision - Intermittent     Does the patient have the potential to tolerate intense rehabilitation     Barriers to Discharge        Equipment Recommendations  None recommended by PT    Recommendations for Other Services    Frequency Min 4X/week   Plan Frequency remains appropriate;Discharge plan needs to be updated    Precautions / Restrictions Precautions Precautions: Fall Precaution Comments: Pt tends to veer toward left when walking.   Pertinent Vitals/Pain no apparent distress     Mobility  Bed Mobility Sitting - Scoot to Edge of Bed: 7: Independent Transfers Transfers: Sit to Stand;Stand to Sit Sit to Stand: 5: Supervision;From bed Stand to Sit: 5: Supervision;To bed Ambulation/Gait Ambulation/Gait Assistance: 5: Supervision Ambulation Distance (Feet): 350 Feet Assistive device: None Ambulation/Gait Assistance Details: supervision as pt tended to veer toward left side Gait Pattern: Step-through pattern General Gait Details: Gait is improving; pt able to correct small balance fault during DGI.          PT Goals Acute Rehab PT Goals PT Goal: Sit to Stand - Progress: Met PT Goal: Stand to Sit - Progress: Met PT Transfer Goal: Bed to Chair/Chair to Bed - Progress: Progressing toward goal PT Goal: Ambulate - Progress: Progressing toward goal PT  Goal: Up/Down Stairs - Progress: Progressing toward goal  Visit Information  Last PT Received On: 05/19/12 Assistance Needed: +1 PT/OT Co-Evaluation/Treatment: Yes    Subjective Data  Subjective: "Ok, I'm ready to do some exercise now."  Patient Stated Goal: To go home    Cognition  Overall Cognitive Status: Appears within functional limits for tasks assessed/performed Arousal/Alertness: Awake/alert Orientation Level: Appears intact for tasks assessed Behavior During Session: [facility] for tasks performed Cognition - Other Comments: Pt improved from last visit, more aware of speed while walking     Balance  Static Sitting Balance Static Sitting - Balance Support: No upper extremity supported;Feet supported Static Sitting - Level of Assistance: 7: Independent Static Standing Balance Static Standing - Balance Support: No upper extremity supported Static Standing - Level of Assistance: 7: Independent Static Standing - Comment/# of Minutes: Pt able to maintain upright posture while standing quietly and with conversation Dynamic Gait Index Level Surface: Mild Impairment Change in Gait Speed: Normal Gait with Horizontal Head Turns: Mild Impairment Gait with Vertical Head Turns: Mild Impairment Gait and Pivot Turn: Normal Step Over Obstacle: Mild Impairment Step Around Obstacles: Mild Impairment Steps: Mild Impairment Total Score: 18  High Level Balance High Level Balance Comments: Pt improved from 13 to 18 on DGI, suggesting improved balance and gait, however she continued to veer to left throughout walking.   End of Session PT - End of Session Equipment Utilized During Treatment: Gait belt Activity Tolerance: Patient tolerated treatment well Patient left: in bed;with call bell/phone within reach Nurse Communication: Mobility status       Sharion Balloon 05/19/2012, 3:38  PM  Sharion Balloon, SPT Acute Rehab Services [phone]

## 2012-05-19 NOTE — Progress Notes (Signed)
Occupational Therapy Treatment Patient Details Name: Shannon Yu MRN: [id number] DOB: [birth year] Today's Date: 05/19/2012 Time: 1410-1440 OT Time Calculation (min): 30 min  OT Assessment / Plan / Recommendation Comments on Treatment Session Pt continues to report headache but is able to participate in therapy despite the discomfort. Given pt's performan during ambulation and vision testing, I would recommend ophthalmological exam vs driving rehab to clear pt prior to returning to driving.     Follow Up Recommendations  No OT follow up;Supervision - Intermittent    Barriers to Discharge       Equipment Recommendations  None recommended by PT    Recommendations for Other Services    Frequency     Plan Discharge plan remains appropriate    Precautions / Restrictions Precautions Precautions: Fall Precaution Comments: Pt tends to veer toward left when walking. Restrictions Weight Bearing Restrictions: No   Pertinent Vitals/Pain Pt reports headache but did not rate- agreeable to therapy.     ADL  Toilet Transfer: Radiographer, therapeutic Method: Sit to Barista: Regular height toilet Toileting - Clothing Manipulation and Hygiene: Supervision/safety Where Assessed - Engineer, mining and Hygiene: Sit to stand from 3-in-1 or toilet Transfers/Ambulation Related to ADLs: Supervision/min guard A for ambulation without AD- occasionally veering left and stumbles but able to self-correct ADL Comments: pt with tendency to veer left and come close to running into doorways on the left. Tested peripheral vision which did not reveal any deficits. Pt did demonstrate difficulty with moving saccades. I would recommend thorough opthalmology exam prior to returning to driving-    OT Diagnosis:    OT Problem List:   OT Treatment Interventions:     OT Goals ADL Goals ADL Goal: Toilet Transfer - Progress: Progressing toward goals  Visit  Information  Last OT Received On: 05/19/12 Assistance Needed: +1    Subjective Data      Prior Functioning       Cognition  Overall Cognitive Status: Appears within functional limits for tasks assessed/performed Arousal/Alertness: Awake/alert Orientation Level: Appears intact for tasks assessed Behavior During Session: [facility] for tasks performed Cognition - Other Comments: Pt improved from last visit, more aware of speed while walking     Mobility  Shoulder Instructions Bed Mobility Sitting - Scoot to Edge of Bed: 7: Independent Transfers Sit to Stand: 5: Supervision;From bed Stand to Sit: 5: Supervision;To bed                End of Session OT - End of Session Equipment Utilized During Treatment: Gait belt Activity Tolerance: Patient tolerated treatment well Patient left: in bed (sitting EOB) Nurse Communication: Mobility status  GO     Vincie Linn 05/19/2012, 3:43 PM

## 2012-05-19 NOTE — Progress Notes (Signed)
Agree with PT treatment note.  Kalley Nicholl, PT DPT [phone]  

## 2012-05-19 NOTE — Progress Notes (Signed)
UR completed 

## 2012-05-20 NOTE — Progress Notes (Signed)
Patient ID: Shannon Yu, female   DOB: [birth year], 56 y.o.   MRN: [id number] Decrease headahe. Ct head wnl. Neuro stable

## 2012-05-20 NOTE — Progress Notes (Signed)
Patient continues to do well.  Neuro IR plans to see patient 2 weeks post discharge from the hospital.  She will need a 3 month follow up angiogram which will be scheduled at the time of her follow up appointment as an OP.  Please call [phone] for any questions.

## 2012-05-20 NOTE — Progress Notes (Signed)
Physical Therapy Treatment Patient Details Name: Shannon Yu MRN: [id number] DOB: July 11, 1955 Today's Date: 05/20/2012 Time: 1036-1100 PT Time Calculation (min): 24 min  PT Assessment / Plan / Recommendation Comments on Treatment Session  Pt able to perform transfers and mobility indpendently as well as stairs with minguard (A).  Pt will have 24 hour supervision at home.  Educated pt on slow return to activities and to discuss further with MDs.   Pt scored 23/24 on DGI and making great progress.  Will d/c from acute PT services.    Follow Up Recommendations  No PT follow up     Does the patient have the potential to tolerate intense rehabilitation     Barriers to Discharge        Equipment Recommendations  None recommended by PT    Recommendations for Other Services    Frequency Min 4X/week   Plan Frequency remains appropriate;Discharge plan needs to be updated    Precautions / Restrictions Restrictions Weight Bearing Restrictions: No   Pertinent Vitals/Pain 3/10 headache     Mobility  Transfers Transfers: Sit to Stand;Stand to Sit Sit to Stand: 7: Independent Stand to Sit: 7: Independent Ambulation/Gait Ambulation/Gait Assistance: 7: Independent Ambulation Distance (Feet): 400 Feet Assistive device: None Ambulation/Gait Assistance Details: No assistanc needed and able to perform high balance activities during ambulation without assistance. Gait Pattern: Within Functional Limits Stairs: Yes Stairs Assistance: 4: Min guard Stair Management Technique: One rail Left;Forwards Number of Stairs: 4     Exercises     PT Diagnosis:    PT Problem List:   PT Treatment Interventions:     PT Goals Acute Rehab PT Goals PT Goal Formulation: With patient Time For Goal Achievement: 05/25/12 Potential to Achieve Goals: Good Pt will go Sit to Stand: with modified independence PT Goal: Sit to Stand - Progress: Met Pt will go Stand to Sit: with modified independence PT Goal:  Stand to Sit - Progress: Met Pt will Transfer Bed to Chair/Chair to Bed: with modified independence PT Transfer Goal: Bed to Chair/Chair to Bed - Progress: Met Pt will Ambulate: >150 feet;with modified independence;with least restrictive assistive device PT Goal: Ambulate - Progress: Met Pt will Go Up / Down Stairs: Flight;with supervision;with rail(s) PT Goal: Up/Down Stairs - Progress: Not met (But performing 4 stairs with minguard for safety)  Visit Information  Last PT Received On: 05/20/12 Assistance Needed: +1    Subjective Data  Subjective: "I'm feeling so much better today.  I'm so lucky you know." Patient Stated Goal: To go home    Cognition  Overall Cognitive Status: Appears within functional limits for tasks assessed/performed Arousal/Alertness: Awake/alert Orientation Level: Appears intact for tasks assessed Behavior During Session: [facility] for tasks performed    Balance  Dynamic Gait Index Level Surface: Normal Change in Gait Speed: Normal Gait with Horizontal Head Turns: Normal Gait with Vertical Head Turns: Normal Gait and Pivot Turn: Normal Step Over Obstacle: Mild Impairment Step Around Obstacles: Normal Steps: Normal Total Score: 23   End of Session PT - End of Session Equipment Utilized During Treatment: Gait belt Activity Tolerance: Patient tolerated treatment well Patient left: in bed;with call bell/phone within reach;with family/visitor present (Pt sitting EOB at end of session) Nurse Communication: Mobility status   GP     Shannon Yu 05/20/2012, 12:59 PM Jake Shark, PT DPT [phone]

## 2012-05-20 NOTE — Progress Notes (Signed)
VASCULAR LAB PRELIMINARY  PRELIMINARY  PRELIMINARY  PRELIMINARY  Transcranial Doppler  Date POD PCO2 HCT BP  MCA ACA PCA OPHT SIPH VERT Basilar  05-10-12 HC 0 35.3@0235  35.6@0442  118/54@0758  Right  Left   38  19   -25  -19   24  20   12  14   28  28    -33  -20   -36      05/12/12 VS 2    Right  Left   39  37   -34  -54   44  36   17  15   40  51   -29  -29   -31      05/14/12 vs 4    Right  Left   48 45     -36  -51   40  41   15  15   34  35   -32  -36   -43      11-8 hc 6 No  recent No  recent 134/81 @ 1200 Right  Left   40  41   -32  -29   18  15   14  15    32  24   -24  -25   -37       11-12 hc 10 No recent No recent 118/98 @ 0900 Right  Left   54  33   -33  -34   32  21   16  20    53  35   -36  34   -39           Right  Left                                            Right  Left                                        MCA = Middle Cerebral Artery      OPHT = Opthalmic Artery     BASILAR = Basilar Artery   ACA = Anterior Cerebral Artery     SIPH = Carotid Siphon PCA = Posterior Cerebral Artery   VERT = Verterbral Artery                   Normal MCA = 62+\-12 ACA = 50+\-12 PCA = 42+\-23     Jonalyn Sedlak RVT 05/10/12 05/12/2012, VS  Shawntae Lowy, RVT 05/20/2012, 9:48 AM

## 2012-05-21 MED ORDER — HYDROMORPHONE HCL PF 1 MG/ML IJ SOLN
1.0000 mg | INTRAMUSCULAR | Status: DC | PRN
  Administered 2012-05-21 – 2012-05-23 (×4): 1 mg via INTRAVENOUS
  Administered 2012-05-23 – 2012-05-24 (×7): 2 mg via INTRAVENOUS
  Administered 2012-05-25 – 2012-05-26 (×3): 1 mg via INTRAVENOUS
  Filled 2012-05-21 (×2): qty 2
  Filled 2012-05-21: qty 1
  Filled 2012-05-21: qty 2
  Filled 2012-05-21 (×2): qty 1
  Filled 2012-05-21: qty 2
  Filled 2012-05-21 (×2): qty 1
  Filled 2012-05-21 (×2): qty 2
  Filled 2012-05-21: qty 1
  Filled 2012-05-21 (×2): qty 2

## 2012-05-21 MED ORDER — POTASSIUM CHLORIDE CRYS ER 20 MEQ PO TBCR
20.0000 meq | EXTENDED_RELEASE_TABLET | Freq: Three times a day (TID) | ORAL | Status: DC
  Administered 2012-05-21 – 2012-05-26 (×17): 20 meq via ORAL
  Filled 2012-05-21 (×21): qty 1

## 2012-05-21 MED ORDER — HYDROCODONE-ACETAMINOPHEN 10-325 MG PO TABS
1.0000 | ORAL_TABLET | ORAL | Status: DC | PRN
  Administered 2012-05-21 (×2): 2 via ORAL
  Administered 2012-05-21: 1 via ORAL
  Administered 2012-05-22 – 2012-05-23 (×5): 2 via ORAL
  Administered 2012-05-25 – 2012-05-27 (×2): 1 via ORAL
  Filled 2012-05-21 (×2): qty 1
  Filled 2012-05-21 (×8): qty 2

## 2012-05-21 NOTE — Progress Notes (Signed)
Patient ID: Shannon Yu, female   DOB: 1956/06/22, 56 y.o.   MRN: [id number] Neuro stable, decrease of headache. Low potassium. See orders

## 2012-05-21 NOTE — Progress Notes (Signed)
   CARE MANAGEMENT NOTE 05/21/2012  Patient:  Shannon Yu, Shannon Yu   Account Number:  [id number]  Date Initiated:  05/13/2012  Documentation initiated by:  Carlyle Lipa  Subjective/Objective Assessment:   SAH; required coiling to repair     Action/Plan:   home when medically stable; with or without HH depending on progress   Anticipated DC Date:  05/22/2012   Anticipated DC Plan:  HOME/SELF CARE      DC Planning Services  CM consult      Choice offered to / List presented to:             Status of service:  Completed, signed off Medicare Important Message given?   (If response is "NO", the following Medicare IM given date fields will be blank) Date Medicare IM given:   Date Additional Medicare IM given:    Discharge Disposition:  HOME/SELF CARE  Per UR Regulation:  Reviewed for med. necessity/level of care/duration of stay  If discussed at Long Length of Stay Meetings, dates discussed:   05/15/2012    Comments:  05/21/2012 1530 NCM spoke to pt and states her spouse is there to assist her at home. PT/OT did not recommend any therapies at home. Pt states no DME is needed. No NCM needs identified. Isidoro Donning RN CCM Case Mgmt phone [phone]  05/19/2012 Carlyle Lipa, RN BSN [facility] CCM [phone]--Pt remains on ICU. Still with headache using 7-9 doses IV Dilaudid daily. Planned for transcranial dopplers and head CT today but neither is completed at this writing.

## 2012-05-21 NOTE — Progress Notes (Signed)
VASCULAR LAB PRELIMINARY  PRELIMINARY  PRELIMINARY  PRELIMINARY  Transcranial Doppler  Date POD PCO2 HCT BP  MCA ACA PCA OPHT SIPH VERT Basilar  05-10-12 HC 0 35.3@0235  35.6@0442  118/54@0758  Right  Left   38  19   -25  -19   24  20   12  14   28  28    -33  -20   -36      05/12/12 VS 2    Right  Left   39  37   -34  -54   44  36   17  15   40  51   -29  -29   -31      05/14/12 vs 4    Right  Left   48 45     -36  -51   40  41   15  15   34  35   -32  -36   -43      11-8 hc 6 No  recent No  recent 134/81 @ 1200 Right  Left   40  41   -32  -29   18  15   14  15    32  24   -24  -25   -37       11-12 hc 10 No recent No recent 118/98 @ 0900 Right  Left   54  33   -33  -34   32  21   16  20    53  35   -36  34   -39      05-21-12 SB 11    Right  Left   47  73   -30  -47   -23    --   17  18   25  24    -18  -26     -49         Right  Left                                        MCA = Middle Cerebral Artery      OPHT = Opthalmic Artery     BASILAR = Basilar Artery   ACA = Anterior Cerebral Artery     SIPH = Carotid Siphon PCA = Posterior Cerebral Artery   VERT = Verterbral Artery                   Normal MCA = 62+\-12 ACA = 50+\-12 PCA = 42+\-23     CESTONE,HELENE RVT 05/10/12 05/12/2012, VS  Stevi Hollinshead, RVT 05/21/2012, 3:39 PM

## 2012-05-22 MED ORDER — SUMATRIPTAN SUCCINATE 50 MG PO TABS
50.0000 mg | ORAL_TABLET | ORAL | Status: DC | PRN
  Administered 2012-05-22 – 2012-05-23 (×3): 50 mg via ORAL
  Filled 2012-05-22 (×3): qty 1

## 2012-05-22 MED ORDER — OXYCODONE HCL 5 MG PO TABS
5.0000 mg | ORAL_TABLET | ORAL | Status: DC | PRN
  Administered 2012-05-22 – 2012-05-27 (×12): 5 mg via ORAL
  Filled 2012-05-22 (×12): qty 1

## 2012-05-22 NOTE — Progress Notes (Signed)
Occupational Therapy Discharge Patient Details Name: [name] MRN: [id number] DOB: 06/13/1956 Today's Date: 05/22/2012 Time:  -     Patient discharged from OT services secondary to pt moving about room with supervison of husband on my arrival. She was in bathroom putting her makeup on standing at the sink and had just completed showering and dressing before my arrival. Pt ambulated out of the bathroom to speak to nutrition services lady at an independent level. Although pt has not met goals of mod I, she is very close and husband can be there as much as she needs him. Pt does not need tub seat, says she was ok standing up her for her shower this AM. No more acute OT needs, will sign off..  Please see latest therapy progress note for current level of functioning and progress toward goals.    Progress and discharge plan discussed with patient and/or caregiver: Patient/Caregiver agrees with plan       Evette Georges [phone] 05/22/2012, 9:29 AM

## 2012-05-22 NOTE — Progress Notes (Signed)
Patient ID: Shannon Yu, female   DOB: September 30, 1955, 56 y.o.   MRN: [id number] Ct head no evidence of blood or hydrocephalus. Continue with headache

## 2012-05-23 MED ORDER — HYDROMORPHONE HCL 2 MG PO TABS
4.0000 mg | ORAL_TABLET | ORAL | Status: DC | PRN
  Administered 2012-05-23 – 2012-05-25 (×6): 4 mg via ORAL
  Filled 2012-05-23 (×8): qty 2

## 2012-05-23 NOTE — Progress Notes (Signed)
Patient ID: Shannon Yu, female   DOB: 1956-02-03, 56 y.o.   MRN: [id number] Still with headache. Neuro stable.to star po dilaudid. She can take her own hormonal replacement. Tomorrow will be two weeks on nimotop. Dr Franky Macho to take over her case while im away

## 2012-05-23 NOTE — Progress Notes (Signed)
Patient has been complaining of constant headache today.  Patient has tried various  PO pain medications without relief. See Mar.  Patient given 2mg  IV dilaudid at 1600, patient stated this has provided her relief. Husband at bedside.

## 2012-05-24 NOTE — Progress Notes (Signed)
Patient continues to complain of headaches. Relief given with use of po dilaudid but doesn't last full four hours. Encouraged continued use of PO medicine. Will alternate with oxycodone po. Mamie Levers

## 2012-05-24 NOTE — Progress Notes (Signed)
Patient ID: Shannon Yu, female   DOB: 06-Sep-1955, 56 y.o.   MRN: [id number] BP 112/78  Pulse 80  Temp 97.8 F (36.6 C) (Oral)  Resp 20  Ht 5\' 9"  (1.753 m)  Wt 78.5 kg (173 lb 1 oz)  BMI 25.56 kg/m2  SpO2 97% Alert and oriented x 4 Perrl, full eom Symmetric facies Tongue and uvula midline Moving all extremities well, no drift Complaining of severe headache. Encouraged use of oral meds as primary, to be supplemented only when necessary with iv meds.

## 2012-05-25 NOTE — Progress Notes (Signed)
Patient ID: Shannon Yu, female   DOB: 28-Nov-1955, 56 y.o.   MRN: [id number] BP 105/65  Pulse 75  Temp 98.5 F (36.9 C) (Oral)  Resp 18  Ht 5\' 9"  (1.753 m)  Wt 78.5 kg (173 lb 1 oz)  BMI 25.56 kg/m2  SpO2 100% Alert and oriented x 4 Speech clear and fluent, Perrl, full eom Tongue and uvula midline No drift 5/5 strength Headache much improved today. If she continues to do well may discharge tomorrow

## 2012-05-26 MED ORDER — OXYCODONE HCL 5 MG PO TABS: 5.0000 mg | ORAL_TABLET | ORAL | Status: DC | PRN

## 2012-05-26 NOTE — Progress Notes (Signed)
Patient discharged this evening but for her convenient she requested to leave tomorrow morning, paper work and prescription has been given, will pass it on to the night staff.

## 2012-05-26 NOTE — Progress Notes (Signed)
Patient ID: Shannon Yu, female   DOB: 06-17-1956, 56 y.o.   MRN: [id number] BP 114/75  Pulse 102  Temp 98.2 F (36.8 C) (Oral)  Resp 16  Ht 5\' 9"  (1.753 m)  Wt 78.5 kg (173 lb 1 oz)  BMI 25.56 kg/m2  SpO2 98% Alert and oriented x 4. Headaches much better.  Discharge tomorrow.

## 2012-05-27 NOTE — Discharge Summary (Signed)
Physician Discharge Summary  Patient ID: Shannon Yu MRN: [id number] DOB/AGE: [birth year] 56 y.o.  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 CT angio revealed a basilar tip aneurysm. She was endovascularly treated with coil embolization. Post embo she was treated in the NICU and remained very stable. She did not develop hydrocephalus, and maintained an excellent neurological exam. She was eventually moved to the floor and was discharged on day 18.   Consults: radiology  Significant Diagnostic Studies: angiography: Cerebral  Treatments: endovascular coiling  Discharge Exam: Blood pressure 115/65, pulse 82, temperature 98.1 F (36.7 C), temperature source Oral, resp. rate 18, height 5\' 9"  (1.753 m), weight 78.5 kg (173 lb 1 oz), SpO2 96.00%. General appearance: alert, cooperative, appears stated age and no distress Neurologic: Alert and oriented X 3, normal strength and tone. Normal symmetric reflexes. Normal coordination and gait  Disposition: 01-Home or Self Care     Medication List     As of 05/27/2012  9:22 PM    TAKE these medications         aspirin 81 MG tablet   Take 81 mg by mouth daily.      fish oil-omega-3 fatty acids 1000 MG capsule   Take 1 g by mouth daily.      GLUCOSAMINE 1500 COMPLEX PO   Take 1 capsule by mouth daily.      JINTELI 1-5 MG-MCG Tabs   Generic drug: norethindrone-ethinyl estradiol   Take 1 tablet by mouth daily.      oxyCODONE 5 MG immediate release tablet   Commonly known as: Oxy IR/ROXICODONE   Take 1-2 tablets (5-10 mg total) by mouth every 4 (four) hours as needed for pain.         Signed: Medford Staheli L 05/27/2012, 9:22 PM

## 2012-05-27 NOTE — Progress Notes (Signed)
Patient ready to go home now since she was not able to leave when discharged yesterday.

## 2012-06-09 ENCOUNTER — Other Ambulatory Visit (HOSPITAL_COMMUNITY): Payer: Self-pay | Admitting: Interventional Radiology

## 2012-06-09 DIAGNOSIS — I729 Aneurysm of unspecified site: Secondary | ICD-10-CM

## 2012-06-11 ENCOUNTER — Ambulatory Visit (HOSPITAL_COMMUNITY)
Admission: RE | Admit: 2012-06-11 | Discharge: 2012-06-11 | Disposition: A | Discharge status: Home / Self Care | Payer: 59 | Source: Ambulatory Visit | Attending: Interventional Radiology | Admitting: Interventional Radiology

## 2012-06-11 DIAGNOSIS — I729 Aneurysm of unspecified site: Secondary | ICD-10-CM

## 2012-07-04 ENCOUNTER — Other Ambulatory Visit (HOSPITAL_COMMUNITY): Payer: Self-pay | Admitting: Interventional Radiology

## 2012-07-04 DIAGNOSIS — I729 Aneurysm of unspecified site: Secondary | ICD-10-CM

## 2012-07-09 LAB — HM PAP SMEAR: HM Pap smear: NORMAL

## 2012-07-23 LAB — CBC AND DIFFERENTIAL
Hemoglobin: 14.2 g/dL (ref 12.0–16.0)
Platelets: 323 10*3/uL (ref 150–399)

## 2012-07-23 LAB — BASIC METABOLIC PANEL
Creatinine: 0.8 mg/dL (ref 0.5–1.1)
Potassium: 3.8 mmol/L (ref 3.4–5.3)

## 2012-07-23 LAB — TSH: TSH: 1.16 u[IU]/mL (ref 0.41–5.90)

## 2012-07-23 LAB — LIPID PANEL
Cholesterol: 229 mg/dL — AB (ref 0–200)
HDL: 56 mg/dL (ref 35–70)
Triglycerides: 98 mg/dL (ref 40–160)

## 2012-07-23 LAB — HEMOGLOBIN A1C: Hgb A1c MFr Bld: 5.7 % (ref 4.0–6.0)

## 2012-08-01 ENCOUNTER — Other Ambulatory Visit (HOSPITAL_COMMUNITY): Payer: Self-pay | Admitting: Physician Assistant

## 2012-08-05 ENCOUNTER — Other Ambulatory Visit: Payer: Self-pay | Admitting: Radiology

## 2012-08-06 ENCOUNTER — Encounter (HOSPITAL_COMMUNITY): Payer: Self-pay | Admitting: Pharmacy Technician

## 2012-08-07 ENCOUNTER — Ambulatory Visit (HOSPITAL_COMMUNITY)
Admission: RE | Admit: 2012-08-07 | Discharge: 2012-08-07 | Disposition: A | Discharge status: Home / Self Care | Payer: Commercial Managed Care - PPO | Source: Ambulatory Visit | Attending: Interventional Radiology | Admitting: Interventional Radiology

## 2012-08-07 ENCOUNTER — Other Ambulatory Visit (HOSPITAL_COMMUNITY): Payer: Self-pay | Admitting: Interventional Radiology

## 2012-08-07 DIAGNOSIS — Z09 Encounter for follow-up examination after completed treatment for conditions other than malignant neoplasm: Secondary | ICD-10-CM | POA: Insufficient documentation

## 2012-08-07 DIAGNOSIS — I729 Aneurysm of unspecified site: Secondary | ICD-10-CM

## 2012-08-07 DIAGNOSIS — R51 Headache: Secondary | ICD-10-CM | POA: Insufficient documentation

## 2012-08-07 DIAGNOSIS — Z9889 Other specified postprocedural states: Secondary | ICD-10-CM | POA: Insufficient documentation

## 2012-08-07 DIAGNOSIS — I671 Cerebral aneurysm, nonruptured: Secondary | ICD-10-CM | POA: Insufficient documentation

## 2012-08-07 LAB — APTT: aPTT: 32 seconds (ref 24–37)

## 2012-08-07 LAB — CBC WITH DIFFERENTIAL/PLATELET
Basophils Absolute: 0 10*3/uL (ref 0.0–0.1)
Basophils Relative: 0 % (ref 0–1)
HCT: 42.9 % (ref 36.0–46.0)
Hemoglobin: 14.4 g/dL (ref 12.0–15.0)
Lymphocytes Relative: 45 % (ref 12–46)
MCHC: 33.6 g/dL (ref 30.0–36.0)
Neutro Abs: 2.3 10*3/uL (ref 1.7–7.7)
Neutrophils Relative %: 46 % (ref 43–77)
RDW: 13.3 % (ref 11.5–15.5)
WBC: 5.1 10*3/uL (ref 4.0–10.5)

## 2012-08-07 LAB — PROTIME-INR
INR: 0.95 (ref 0.00–1.49)
Prothrombin Time: 12.6 seconds (ref 11.6–15.2)

## 2012-08-07 LAB — BASIC METABOLIC PANEL
BUN: 10 mg/dL (ref 6–23)
Chloride: 105 mEq/L (ref 96–112)
Creatinine, Ser: 0.89 mg/dL (ref 0.50–1.10)
GFR calc Af Amer: 83 mL/min — ABNORMAL LOW (ref >90)
GFR calc non Af Amer: 72 mL/min — ABNORMAL LOW (ref >90)
Potassium: 3.9 mEq/L (ref 3.5–5.1)

## 2012-08-07 MED ORDER — FENTANYL CITRATE 0.05 MG/ML IJ SOLN
INTRAMUSCULAR | Status: AC
  Filled 2012-08-07: qty 2

## 2012-08-07 MED ORDER — MIDAZOLAM HCL 2 MG/2ML IJ SOLN
INTRAMUSCULAR | Status: AC | PRN
  Administered 2012-08-07: 1 mg via INTRAVENOUS

## 2012-08-07 MED ORDER — SODIUM CHLORIDE 0.9 % IV SOLN
INTRAVENOUS | Status: AC
  Administered 2012-08-07: 12:00:00 via INTRAVENOUS

## 2012-08-07 MED ORDER — MIDAZOLAM HCL 2 MG/2ML IJ SOLN
INTRAMUSCULAR | Status: AC
  Filled 2012-08-07: qty 2

## 2012-08-07 MED ORDER — ONDANSETRON HCL 4 MG/2ML IJ SOLN
INTRAMUSCULAR | Status: AC
  Filled 2012-08-07: qty 2

## 2012-08-07 MED ORDER — SODIUM CHLORIDE 0.9 % IV SOLN: Freq: Once | INTRAVENOUS | Status: DC

## 2012-08-07 MED ORDER — IOHEXOL 300 MG/ML  SOLN
150.0000 mL | Freq: Once | INTRAMUSCULAR | Status: AC | PRN
  Administered 2012-08-07: 40 mL via INTRAVENOUS

## 2012-08-07 MED ORDER — HEPARIN SOD (PORK) LOCK FLUSH 100 UNIT/ML IV SOLN
INTRAVENOUS | Status: AC | PRN
  Administered 2012-08-07 (×2): 500 [IU] via INTRAVENOUS

## 2012-08-07 MED ORDER — FENTANYL CITRATE 0.05 MG/ML IJ SOLN
INTRAMUSCULAR | Status: AC | PRN
  Administered 2012-08-07: 25 ug via INTRAVENOUS

## 2012-08-07 NOTE — H&P (Signed)
Shannon Yu is an 57 y.o. female.   Chief Complaint: here today to follow up on my aneurysm HPI: Hx of basilar artery aneurysm, SAH s/p coiling. Patient presents today for follow up cerebral angiogram.   Past Medical History  Diagnosis Date  . Menopause   . Environmental allergies   . Migraine     Past Surgical History  Procedure Date  . Knee arthroscopy     Left    Family History  Problem Relation Age of Onset  . Emphysema Father     smoker  . Lung cancer Father   . Prostate cancer Father   . Kidney disease Father    Social History:  reports that she has quit smoking. Her smoking use included Cigarettes. She does not have any smokeless tobacco history on file. She reports that she drinks alcohol. She reports that she does not use illicit drugs.  Allergies: No Known Allergies    Medication List     As of 08/07/2012  9:49 AM    ASK your doctor about these medications         GLUCOSAMINE 1500 COMPLEX PO   Take 1 capsule by mouth daily.      JINTELI 1-5 MG-MCG Tabs   Generic drug: norethindrone-ethinyl estradiol   Take 1 tablet by mouth daily.      oxyCODONE 5 MG immediate release tablet   Commonly known as: Oxy IR/ROXICODONE   Take 5-10 mg by mouth every 4 (four) hours as needed. For pain          Results for orders placed during the hospital encounter of 08/07/12 (from the past 48 hour(s))  APTT     Status: Normal   Collection Time   08/07/12  8:08 AM      Component Value Range Comment   aPTT 32  24 - 37 seconds   BASIC METABOLIC PANEL     Status: Abnormal   Collection Time   08/07/12  8:08 AM      Component Value Range Comment   Sodium 140  135 - 145 mEq/L    Potassium 3.9  3.5 - 5.1 mEq/L    Chloride 105  96 - 112 mEq/L    CO2 24  19 - 32 mEq/L    Glucose, Bld 89  70 - 99 mg/dL    BUN 10  6 - 23 mg/dL    Creatinine, Ser 0.86  0.50 - 1.10 mg/dL    Calcium 9.4  8.4 - 57.8 mg/dL    GFR calc non Af Amer 72 (*) >90 mL/min    GFR calc Af Amer 83 (*)  >90 mL/min   CBC WITH DIFFERENTIAL     Status: Normal   Collection Time   08/07/12  8:08 AM      Component Value Range Comment   WBC 5.1  4.0 - 10.5 K/uL    RBC 4.45  3.87 - 5.11 MIL/uL    Hemoglobin 14.4  12.0 - 15.0 g/dL    HCT 46.9  62.9 - 52.8 %    MCV 96.4  78.0 - 100.0 fL    MCH 32.4  26.0 - 34.0 pg    MCHC 33.6  30.0 - 36.0 g/dL    RDW 41.3  24.4 - 01.0 %    Platelets 241  150 - 400 K/uL    Neutrophils Relative 46  43 - 77 %    Neutro Abs 2.3  1.7 - 7.7 K/uL    Lymphocytes  Relative 45  12 - 46 %    Lymphs Abs 2.3  0.7 - 4.0 K/uL    Monocytes Relative 8  3 - 12 %    Monocytes Absolute 0.4  0.1 - 1.0 K/uL    Eosinophils Relative 1  0 - 5 %    Eosinophils Absolute 0.1  0.0 - 0.7 K/uL    Basophils Relative 0  0 - 1 %    Basophils Absolute 0.0  0.0 - 0.1 K/uL   PROTIME-INR     Status: Normal   Collection Time   08/07/12  8:08 AM      Component Value Range Comment   Prothrombin Time 12.6  11.6 - 15.2 seconds    INR 0.95  0.00 - 1.49     Review of Systems  Constitutional: Positive for malaise/fatigue. Negative for fever, chills and weight loss.  HENT: Negative.   Eyes: Positive for blurred vision.       Occasional intermittent blurring - resolves spontaneously   Respiratory: Negative.   Cardiovascular: Negative.   Gastrointestinal: Negative.   Genitourinary: Negative.   Musculoskeletal: Negative.   Skin: Negative.   Neurological: Negative.   Endo/Heme/Allergies: Negative.   Psychiatric/Behavioral: Negative.     Blood pressure 124/83, pulse 78, temperature 98.9 F (37.2 C), temperature source Oral, resp. rate 20, height 5\' 8"  (1.727 m), weight 157 lb (71.215 kg), SpO2 98.00%. Physical Exam  Constitutional: She is oriented to person, place, and time. She appears well-developed and well-nourished. No distress.  HENT:  Head: Normocephalic and atraumatic.  Neck: Normal range of motion.  Cardiovascular: Normal rate, regular rhythm, normal heart sounds and intact distal  pulses.  Exam reveals no gallop and no friction rub.   No murmur heard. Respiratory: Effort normal and breath sounds normal. No respiratory distress. She has no wheezes. She has no rales. She exhibits no tenderness.  GI: Soft. Bowel sounds are normal. She exhibits no distension.  Musculoskeletal: Normal range of motion. She exhibits no edema.  Neurological: She is alert and oriented to person, place, and time.  Skin: Skin is warm and dry.  Psychiatric: She has a normal mood and affect. Her behavior is normal. Judgment and thought content normal.     Assessment/Plan Cerebral angiogram procedure details discussed with patient and spouse and all questions answered to their satisfaction. Written consent obtained. Labs reviewed and are appropriate to proceed.     [name] 08/07/2012, 9:48 AM

## 2012-08-07 NOTE — ED Notes (Signed)
O2 2l/[place] started 

## 2012-08-07 NOTE — ED Notes (Signed)
MD at bedside. Dr Corliss Skains explaining findings

## 2012-08-07 NOTE — ED Notes (Signed)
Sheath pulled by Marinus Maw, RT, holding pressure. Pt tolerated procedure well.

## 2012-08-07 NOTE — ED Notes (Signed)
Short Stay bed requested.

## 2012-08-07 NOTE — Procedures (Addendum)
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

## 2012-08-07 NOTE — ED Notes (Signed)
O2 d/c'd 

## 2012-08-14 ENCOUNTER — Other Ambulatory Visit (HOSPITAL_COMMUNITY): Payer: Self-pay | Admitting: Interventional Radiology

## 2012-08-14 DIAGNOSIS — I729 Aneurysm of unspecified site: Secondary | ICD-10-CM

## 2012-08-22 ENCOUNTER — Other Ambulatory Visit: Payer: Self-pay | Admitting: Radiology

## 2012-08-25 ENCOUNTER — Encounter (HOSPITAL_COMMUNITY): Payer: Self-pay | Admitting: Pharmacy Technician

## 2012-08-28 ENCOUNTER — Encounter (HOSPITAL_COMMUNITY)
Admission: RE | Admit: 2012-08-28 | Discharge: 2012-08-28 | Disposition: A | Discharge status: Home / Self Care | Payer: Commercial Managed Care - PPO | Source: Ambulatory Visit | Attending: Interventional Radiology | Admitting: Interventional Radiology

## 2012-08-28 ENCOUNTER — Encounter (HOSPITAL_COMMUNITY): Payer: Self-pay

## 2012-08-28 HISTORY — DX: Nausea with vomiting, unspecified: R11.2

## 2012-08-28 HISTORY — DX: Other specified postprocedural states: Z98.890

## 2012-08-28 HISTORY — DX: Sleep apnea, unspecified: G47.30

## 2012-08-28 LAB — COMPREHENSIVE METABOLIC PANEL
ALT: 13 U/L (ref 0–35)
CO2: 26 mEq/L (ref 19–32)
Calcium: 9.6 mg/dL (ref 8.4–10.5)
Creatinine, Ser: 0.76 mg/dL (ref 0.50–1.10)
GFR calc Af Amer: >90 mL/min (ref >90)
GFR calc non Af Amer: >90 mL/min (ref >90)
Glucose, Bld: 83 mg/dL (ref 70–99)
Total Bilirubin: 0.3 mg/dL (ref 0.3–1.2)

## 2012-08-28 LAB — CBC WITH DIFFERENTIAL/PLATELET
Eosinophils Relative: 1 % (ref 0–5)
HCT: 39.8 % (ref 36.0–46.0)
Hemoglobin: 13.7 g/dL (ref 12.0–15.0)
Lymphocytes Relative: 51 % — ABNORMAL HIGH (ref 12–46)
Lymphs Abs: 2.8 10*3/uL (ref 0.7–4.0)
MCV: 95.7 fL (ref 78.0–100.0)
Monocytes Absolute: 0.5 10*3/uL (ref 0.1–1.0)
RBC: 4.16 MIL/uL (ref 3.87–5.11)
WBC: 5.5 10*3/uL (ref 4.0–10.5)

## 2012-08-28 LAB — PROTIME-INR: INR: 1 (ref 0.00–1.49)

## 2012-08-28 NOTE — Pre-Procedure Instructions (Signed)
LEMYA GREENWELL  08/28/2012   Your procedure is scheduled on: Wednesday February 26,2014  Report to [facility] at 0600 AM.  Call this number if you have problems the morning of surgery: [phone]   Remember:   Do not eat food or drink liquids after midnight.Tuesday   Take these medicines the morning of surgery with A SIP OF WATER: No home meds   Do not wear jewelry, make-up or nail polish.  Do not wear lotions, powders, or perfumes. You may wear deodorant.  Do not shave 48 hours prior to surgery.   Do not bring valuables to the hospital.  Contacts, dentures or bridgework may not be worn into surgery.  Leave suitcase in the car. After surgery it may be brought to your room.  For patients admitted to the hospital, checkout time is 11:00 AM the day of  discharge.   Patients discharged the day of surgery will not be allowed to drive  home.    Special Instructions: Shower using CHG 2 nights before surgery and the night before surgery.  If you shower the day of surgery use CHG.  Use special wash - you have one bottle of CHG for all showers.  You should use approximately 1/3 of the bottle for each shower.   Please read over the following fact sheets that you were given: Pain Booklet, Coughing and Deep Breathing and Surgical Site Infection Prevention

## 2012-09-02 ENCOUNTER — Encounter (HOSPITAL_COMMUNITY): Payer: Self-pay | Admitting: Pharmacy Technician

## 2012-09-03 ENCOUNTER — Encounter (HOSPITAL_COMMUNITY)
Admission: RE | Disposition: A | Discharge status: Home / Self Care | Payer: Self-pay | Source: Ambulatory Visit | Attending: Interventional Radiology

## 2012-09-03 ENCOUNTER — Inpatient Hospital Stay (HOSPITAL_COMMUNITY)
Admission: RE | Admit: 2012-09-03 | Discharge: 2012-09-04 | DRG: 027 | Disposition: A | Discharge status: Home / Self Care | Payer: Commercial Managed Care - PPO | Source: Ambulatory Visit | Attending: Interventional Radiology | Admitting: Interventional Radiology

## 2012-09-03 ENCOUNTER — Encounter (HOSPITAL_COMMUNITY): Payer: Self-pay | Admitting: Certified Registered"

## 2012-09-03 ENCOUNTER — Encounter (HOSPITAL_COMMUNITY): Payer: Self-pay

## 2012-09-03 ENCOUNTER — Ambulatory Visit (HOSPITAL_COMMUNITY): Payer: Commercial Managed Care - PPO | Admitting: Certified Registered"

## 2012-09-03 ENCOUNTER — Ambulatory Visit (HOSPITAL_COMMUNITY)
Admission: RE | Admit: 2012-09-03 | Discharge: 2012-09-03 | Disposition: A | Discharge status: Home / Self Care | Payer: Commercial Managed Care - PPO | Source: Ambulatory Visit | Attending: Interventional Radiology | Admitting: Interventional Radiology

## 2012-09-03 ENCOUNTER — Encounter (HOSPITAL_COMMUNITY): Payer: Self-pay | Admitting: *Deleted

## 2012-09-03 DIAGNOSIS — I671 Cerebral aneurysm, nonruptured: Principal | ICD-10-CM | POA: Diagnosis present

## 2012-09-03 DIAGNOSIS — Z9119 Patient's noncompliance with other medical treatment and regimen: Secondary | ICD-10-CM

## 2012-09-03 DIAGNOSIS — G4730 Sleep apnea, unspecified: Secondary | ICD-10-CM | POA: Diagnosis present

## 2012-09-03 DIAGNOSIS — I729 Aneurysm of unspecified site: Secondary | ICD-10-CM

## 2012-09-03 DIAGNOSIS — M1710 Unilateral primary osteoarthritis, unspecified knee: Secondary | ICD-10-CM | POA: Diagnosis present

## 2012-09-03 DIAGNOSIS — Z780 Asymptomatic menopausal state: Secondary | ICD-10-CM

## 2012-09-03 DIAGNOSIS — R112 Nausea with vomiting, unspecified: Secondary | ICD-10-CM | POA: Diagnosis not present

## 2012-09-03 DIAGNOSIS — G43909 Migraine, unspecified, not intractable, without status migrainosus: Secondary | ICD-10-CM | POA: Diagnosis present

## 2012-09-03 DIAGNOSIS — Z91199 Patient's noncompliance with other medical treatment and regimen due to unspecified reason: Secondary | ICD-10-CM

## 2012-09-03 HISTORY — PX: RADIOLOGY WITH ANESTHESIA: SHX6223

## 2012-09-03 LAB — HEPARIN LEVEL (UNFRACTIONATED): Heparin Unfractionated: 0.3 IU/mL (ref 0.30–0.70)

## 2012-09-03 LAB — POCT ACTIVATED CLOTTING TIME
Activated Clotting Time: 187 seconds
Activated Clotting Time: 187 seconds
Activated Clotting Time: 209 seconds

## 2012-09-03 SURGERY — RADIOLOGY WITH ANESTHESIA
Anesthesia: General

## 2012-09-03 MED ORDER — FENTANYL CITRATE 0.05 MG/ML IJ SOLN
INTRAMUSCULAR | Status: AC
  Filled 2012-09-03: qty 2

## 2012-09-03 MED ORDER — HYDROMORPHONE HCL PF 1 MG/ML IJ SOLN
INTRAMUSCULAR | Status: AC
  Administered 2012-09-03: 0.5 mg
  Filled 2012-09-03: qty 1

## 2012-09-03 MED ORDER — PROTAMINE SULFATE 10 MG/ML IV SOLN
INTRAVENOUS | Status: DC | PRN
  Administered 2012-09-03: 10 mg via INTRAVENOUS

## 2012-09-03 MED ORDER — PROPOFOL 10 MG/ML IV BOLUS
INTRAVENOUS | Status: DC | PRN
  Administered 2012-09-03: 130 mg via INTRAVENOUS

## 2012-09-03 MED ORDER — NEOSTIGMINE METHYLSULFATE 1 MG/ML IJ SOLN
INTRAMUSCULAR | Status: DC | PRN
  Administered 2012-09-03: 3 mg via INTRAVENOUS

## 2012-09-03 MED ORDER — ACETAMINOPHEN 500 MG PO TABS
1000.0000 mg | ORAL_TABLET | Freq: Four times a day (QID) | ORAL | Status: DC | PRN
  Administered 2012-09-03: 1000 mg via ORAL
  Filled 2012-09-03: qty 2

## 2012-09-03 MED ORDER — FENTANYL CITRATE 0.05 MG/ML IJ SOLN
INTRAMUSCULAR | Status: DC | PRN
  Administered 2012-09-03: 100 ug via INTRAVENOUS
  Administered 2012-09-03 (×3): 50 ug via INTRAVENOUS

## 2012-09-03 MED ORDER — ROCURONIUM BROMIDE 100 MG/10ML IV SOLN
INTRAVENOUS | Status: DC | PRN
  Administered 2012-09-03: 50 mg via INTRAVENOUS

## 2012-09-03 MED ORDER — HEPARIN (PORCINE) IN NACL 100-0.45 UNIT/ML-% IJ SOLN
600.0000 [IU]/h | INTRAMUSCULAR | Status: DC
  Filled 2012-09-03: qty 250

## 2012-09-03 MED ORDER — EPTIFIBATIDE 2 MG/ML IV SOLN
INTRAVENOUS | Status: AC
  Filled 2012-09-03: qty 10

## 2012-09-03 MED ORDER — CLOPIDOGREL BISULFATE 75 MG PO TABS
75.0000 mg | ORAL_TABLET | Freq: Once | ORAL | Status: AC
  Administered 2012-09-03: 75 mg via ORAL
  Filled 2012-09-03 (×2): qty 1

## 2012-09-03 MED ORDER — OXYCODONE HCL 5 MG/5ML PO SOLN
5.0000 mg | Freq: Once | ORAL | Status: DC | PRN
Start: 2012-09-03 — End: 2012-09-03

## 2012-09-03 MED ORDER — SODIUM CHLORIDE 0.9 % IV SOLN: Freq: Once | INTRAVENOUS | Status: DC

## 2012-09-03 MED ORDER — ONDANSETRON HCL 4 MG/2ML IJ SOLN
INTRAMUSCULAR | Status: DC | PRN
  Administered 2012-09-03: 4 mg via INTRAVENOUS

## 2012-09-03 MED ORDER — IOHEXOL 300 MG/ML  SOLN
150.0000 mL | Freq: Once | INTRAMUSCULAR | Status: AC | PRN
  Administered 2012-09-03: 230 mL via INTRA_ARTERIAL

## 2012-09-03 MED ORDER — ASPIRIN EC 325 MG PO TBEC
325.0000 mg | DELAYED_RELEASE_TABLET | Freq: Once | ORAL | Status: AC
  Administered 2012-09-03: 325 mg via ORAL
  Filled 2012-09-03 (×2): qty 1

## 2012-09-03 MED ORDER — CEFAZOLIN SODIUM 1-5 GM-% IV SOLN
1.0000 g | Freq: Once | INTRAVENOUS | Status: AC
  Administered 2012-09-03: 2 g via INTRAVENOUS
  Filled 2012-09-03: qty 50

## 2012-09-03 MED ORDER — HYDROMORPHONE HCL PF 1 MG/ML IJ SOLN
0.2500 mg | INTRAMUSCULAR | Status: DC | PRN
  Administered 2012-09-03: 0.5 mg via INTRAVENOUS

## 2012-09-03 MED ORDER — HEPARIN (PORCINE) IN NACL 100-0.45 UNIT/ML-% IJ SOLN
500.0000 [IU]/h | INTRAMUSCULAR | Status: DC
  Administered 2012-09-03: 500 [IU]/h via INTRAVENOUS
  Filled 2012-09-03: qty 250

## 2012-09-03 MED ORDER — MIDAZOLAM HCL 5 MG/5ML IJ SOLN
INTRAMUSCULAR | Status: DC | PRN
  Administered 2012-09-03 (×2): 1 mg via INTRAVENOUS

## 2012-09-03 MED ORDER — OXYCODONE HCL 5 MG PO TABS: 5.0000 mg | ORAL_TABLET | Freq: Once | ORAL | Status: DC | PRN

## 2012-09-03 MED ORDER — NIMODIPINE 30 MG PO CAPS
60.0000 mg | ORAL_CAPSULE | ORAL | Status: AC
  Administered 2012-09-03: 60 mg via ORAL
  Filled 2012-09-03: qty 2

## 2012-09-03 MED ORDER — ONDANSETRON HCL 4 MG/2ML IJ SOLN
4.0000 mg | Freq: Four times a day (QID) | INTRAMUSCULAR | Status: DC | PRN
  Administered 2012-09-03: 4 mg via INTRAVENOUS
  Filled 2012-09-03 (×2): qty 2

## 2012-09-03 MED ORDER — NIMODIPINE 30 MG PO CAPS
ORAL_CAPSULE | ORAL | Status: AC
  Filled 2012-09-03: qty 2

## 2012-09-03 MED ORDER — ASPIRIN 325 MG PO TABS
325.0000 mg | ORAL_TABLET | Freq: Every day | ORAL | Status: DC
  Administered 2012-09-04: 325 mg via ORAL
  Filled 2012-09-03 (×2): qty 1

## 2012-09-03 MED ORDER — HEPARIN SODIUM (PORCINE) 1000 UNIT/ML IJ SOLN
INTRAMUSCULAR | Status: DC | PRN
  Administered 2012-09-03 (×2): 500 [IU] via INTRAVENOUS
  Administered 2012-09-03: 3000 [IU] via INTRAVENOUS

## 2012-09-03 MED ORDER — NICARDIPINE HCL IN NACL 20-0.86 MG/200ML-% IV SOLN
5.0000 mg/h | INTRAVENOUS | Status: DC
  Administered 2012-09-03: 5 mg/h via INTRAVENOUS
  Administered 2012-09-03: 7.5 mg/h via INTRAVENOUS
  Filled 2012-09-03 (×2): qty 200

## 2012-09-03 MED ORDER — SODIUM CHLORIDE 0.9 % IV SOLN
INTRAVENOUS | Status: AC
  Administered 2012-09-03: 75 mL/h via INTRAVENOUS

## 2012-09-03 MED ORDER — LIDOCAINE HCL (CARDIAC) 20 MG/ML IV SOLN
INTRAVENOUS | Status: DC | PRN
  Administered 2012-09-03: 80 mg via INTRAVENOUS

## 2012-09-03 MED ORDER — HEPARIN (PORCINE) IN NACL 100-0.45 UNIT/ML-% IJ SOLN
700.0000 [IU]/h | INTRAMUSCULAR | Status: DC
  Filled 2012-09-03: qty 250

## 2012-09-03 MED ORDER — NITROGLYCERIN 1 MG/10 ML FOR IR/CATH LAB
INTRA_ARTERIAL | Status: AC
  Filled 2012-09-03: qty 10

## 2012-09-03 MED ORDER — PROMETHAZINE HCL 25 MG/ML IJ SOLN: 6.2500 mg | Freq: Once | INTRAMUSCULAR | Status: DC

## 2012-09-03 MED ORDER — GLYCOPYRROLATE 0.2 MG/ML IJ SOLN
INTRAMUSCULAR | Status: DC | PRN
  Administered 2012-09-03: 0.4 mg via INTRAVENOUS

## 2012-09-03 MED ORDER — LACTATED RINGERS IV SOLN
INTRAVENOUS | Status: DC | PRN
  Administered 2012-09-03 (×2): via INTRAVENOUS

## 2012-09-03 MED ORDER — ARTIFICIAL TEARS OP OINT
TOPICAL_OINTMENT | OPHTHALMIC | Status: DC | PRN
  Administered 2012-09-03: 1 via OPHTHALMIC

## 2012-09-03 MED ORDER — CLOPIDOGREL BISULFATE 75 MG PO TABS
75.0000 mg | ORAL_TABLET | Freq: Every day | ORAL | Status: DC
  Administered 2012-09-04: 75 mg via ORAL
  Filled 2012-09-03 (×2): qty 1

## 2012-09-03 MED ORDER — PHENYLEPHRINE HCL 10 MG/ML IJ SOLN
10.0000 mg | INTRAVENOUS | Status: DC | PRN
  Administered 2012-09-03: 10 ug/min via INTRAVENOUS

## 2012-09-03 MED ORDER — MIDAZOLAM HCL 2 MG/2ML IJ SOLN
INTRAMUSCULAR | Status: AC
  Filled 2012-09-03: qty 2

## 2012-09-03 MED ORDER — CEFAZOLIN SODIUM-DEXTROSE 2-3 GM-% IV SOLR
INTRAVENOUS | Status: AC
  Filled 2012-09-03: qty 50

## 2012-09-03 MED ORDER — HEPARIN (PORCINE) IN NACL 100-0.45 UNIT/ML-% IJ SOLN: 500.0000 [IU]/h | INTRAMUSCULAR | Status: DC

## 2012-09-03 MED ORDER — ACETAMINOPHEN 650 MG RE SUPP: 650.0000 mg | Freq: Four times a day (QID) | RECTAL | Status: DC | PRN

## 2012-09-03 MED ORDER — PROMETHAZINE HCL 25 MG/ML IJ SOLN
INTRAMUSCULAR | Status: AC
  Administered 2012-09-03: 6.25 mg
  Filled 2012-09-03: qty 1

## 2012-09-03 MED ORDER — VECURONIUM BROMIDE 10 MG IV SOLR
INTRAVENOUS | Status: DC | PRN
  Administered 2012-09-03 (×2): 1 mg via INTRAVENOUS
  Administered 2012-09-03: 2 mg via INTRAVENOUS

## 2012-09-03 NOTE — Progress Notes (Signed)
Day of Surgery  Subjective:  Post procedure  C/O a mild bifrontal H/A.Marland Kitchen No N/V,visual,motor,speech or sensory symptoms. No chest pains ,sob or wheezinf  Mild sore throat.. Tolerating ice chips..  Objective: Vital signs in last 24 hours: Temp:  [97.5 F (36.4 C)-98.1 F (36.7 C)] 97.5 F (36.4 C) (02/26 1305) Pulse Rate:  [67-79] 67 (02/26 1415) Resp:  [10-28] 12 (02/26 1415) BP: (102-128)/(66-87) 116/67 mmHg (02/26 1415) SpO2:  [100 %] 100 % (02/26 1415) Arterial Line BP: (102-127)/(57-65) 127/65 mmHg (02/26 1415)    Intake/Output from previous day:   Intake/Output this shift: Total I/O In: 2000 [I.V.:2000] Out: 820 [Urine:800; Blood:20]   On examination.  In no acute distress. VS  BP 130s/60s. HR 60s to 70s SR . PaO2 100% on 2L O2 [place]   Neurologically. Alert, awake oriented to time ,place and space.Marland Kitchen  Speech and comprehension clear.Marland Kitchen  PEARLA..3mm R=L   EOMS full..No nystagmus or diplopia.  Visual Fields.Full to confrontation.  No Facial asymmetry.  Tongue Midline..  Motor..           NO drift of outstretched arms..          Power.5/5 Proximally and distally all four extremities..  Fine motor and coordination to finger to nose equal..  Gait Not tested  Romberg Not tested  Heel to toe. Not tested..  Rt groin soft.No palpable hematoma.  Lab Results:  No results found for this basename: WBC, HGB, HCT, PLT,  in the last 72 hours BMET No results found for this basename: NA, K, CL, CO2, GLUCOSE, BUN, CREATININE, CALCIUM,  in the last 72 hours PT/INR No results found for this basename: LABPROT, INR,  in the last 72 hours ABG No results found for this basename: PHART, PCO2, PO2, HCO3,  in the last 72 hours  Studies/Results: No results found.  Anti-infectives: Anti-infectives   Start     Dose/Rate Route Frequency Ordered Stop   09/03/12 0630  ceFAZolin (ANCEF) IVPB 1 g/50 mL premix     1 g 100 mL/hr over 30 Minutes Intravenous  Once 09/03/12  0618 09/03/12 0927   09/03/12 0606  ceFAZolin (ANCEF) 2-3 GM-% IVPB SOLR  Status:  Discontinued    Comments:  CRABTREE, TERESA: cabinet override      09/03/12 0606 09/03/12 0715      Assessment/Plan: s/p  Stent assisted coiling of basilar apex aneurysm.  Plan. 1Continue with close neuro obs and IV heparin. 2.Close BP monitoring as per parameters. 3.Advance diet as tolerated to free liquids after patients HOB raised 30 degrees at 6 pm. 4.D/W patient and spouse.  Kiesha Ensey K 09/03/2012

## 2012-09-03 NOTE — Preoperative (Signed)
Beta Blockers   Reason not to administer Beta Blockers:Not Applicable 

## 2012-09-03 NOTE — Progress Notes (Signed)
Heparin gtt increased to 700 U/hr per order.

## 2012-09-03 NOTE — Progress Notes (Signed)
Patient ID: Shannon Yu, female   DOB: 1956-04-08, 57 y.o.   MRN: [id number]  Post procedure  Extubated without difficulty.. Denies any N/V ,H/As or visual symptoms.Patient given 10 mg of protamine sulfate  For ACT of 200 secs   Obeys  commands appropritely. Moves all 4s equally. Dr Corliss Skains

## 2012-09-03 NOTE — Transfer of Care (Signed)
Immediate Anesthesia Transfer of Care Note  Patient: Shannon Yu  Procedure(s) Performed: Procedure(s) with comments: RADIOLOGY WITH ANESTHESIA (N/A) - aneurysm embolization   Patient Location: PACU  Anesthesia Type:General  Level of Consciousness: awake, alert , oriented and patient cooperative  Airway & Oxygen Therapy: Patient Spontanous Breathing and Patient connected to nasal cannula oxygen  Post-op Assessment: Report given to PACU RN, Post -op Vital signs reviewed and stable and Patient moving all extremities  Post vital signs: Reviewed and stable  Complications: No apparent anesthesia complications

## 2012-09-03 NOTE — H&P (Signed)
Shannon Yu is an 58 y.o. female.   Chief Complaint: Shannon Yu 05/2012 Basilar artery aneurysm coiling 05/2012 Recheck arteriogram 08/13/12 reveals neck remnant Scheduled today for cerebral arteriogram with probable aneurysm coiling/stent placment HPI: Migraines; arthritis  Past Medical History  Diagnosis Date  . Menopause   . Environmental allergies   . Migraine   . PONV (postoperative nausea and vomiting)   . Sleep apnea   . Arthritis     left knee    Past Surgical History  Procedure Laterality Date  . Knee arthroscopy      Left  . Aneurysm coiling  05/09/2012    middle    Family History  Problem Relation Age of Onset  . Emphysema Father     smoker  . Lung cancer Father   . Prostate cancer Father   . Kidney disease Father    Social History:  reports that she has quit smoking. Her smoking use included Cigarettes. She has a 2.5 pack-year smoking history. She has never used smokeless tobacco. She reports that  drinks alcohol. She reports that she does not use illicit drugs.  Allergies: No Known Allergies   (Not in a hospital admission)  No results found for this or any previous visit (from the past 48 hour(s)). No results found.  Review of Systems  Constitutional: Negative for fever.  Respiratory: Negative for shortness of breath.   Cardiovascular: Negative for chest pain.  Gastrointestinal: Negative for nausea, vomiting and abdominal pain.  Neurological: Negative for dizziness, sensory change, speech change, weakness and headaches.    There were no vitals taken for this visit. Physical Exam  Constitutional: She is oriented to person, place, and time. She appears well-developed and well-nourished.  HENT:  Head: Atraumatic.  Eyes: EOM are normal.  Neck: Normal range of motion.  Cardiovascular: Normal rate, regular rhythm and normal heart sounds.   No murmur heard. Respiratory: Effort normal and breath sounds normal. She has no wheezes.  GI: Soft. Bowel sounds are  normal. There is no tenderness.  Musculoskeletal: Normal range of motion. She exhibits no edema and no tenderness.  Neurological: She is alert and oriented to person, place, and time. No cranial nerve deficit. Coordination normal.  Skin: Skin is warm and dry.  Psychiatric: She has a normal mood and affect. Her behavior is normal. Judgment and thought content normal.     Assessment/Plan Previous basilar artery aneurysm coiling 05/2012 Neck remnant seen on recheck arteriogram 08/13/2012 Now scheduled for coiling/stent of same Pt aware of procedure benefits and risks and agreeable to proceed Consent signed and in chart Pt aware if intervention is performed she will be admitted to Neuro ICU overnight   Shannon Yu A 09/03/2012, 7:59 AM

## 2012-09-03 NOTE — Progress Notes (Signed)
ANTICOAGULATION CONSULT NOTE - Initial Consult  Pharmacy Consult for heparin Indication: s/p stent assisted coiling of basilar artery aneurysm  No Known Allergies  Patient Measurements:   Heparin Dosing Weight: 73.2 kg  Vital Signs: Temp: 98.1 F (36.7 C) (02/26 0617) Temp src: Oral (02/26 0617) BP: 128/83 mmHg (02/26 0711) Pulse Rate: 71 (02/26 0617)  Labs: No results found for this basename: HGB, HCT, PLT, APTT, LABPROT, INR, HEPARINUNFRC, CREATININE, CKTOTAL, CKMB, TROPONINI,  in the last 72 hours  The CrCl is unknown because both a height and weight (above a minimum accepted value) are required for this calculation.   Medical History: Past Medical History  Diagnosis Date  . Menopause   . Environmental allergies   . Migraine   . PONV (postoperative nausea and vomiting)   . Sleep apnea   . Arthritis     left knee    Medications:   (Not in a hospital admission)  Assessment: 57 yo lady to start heparin s/p coiling of aneurysm.   Goal of Therapy:  Heparin level 0.1-0.25 units/ml Monitor platelets by anticoagulation protocol: Yes   Plan:  Heparin drip at 700 units/hr Check HL 8 hours after start. Daily HL and CBC while on heparin.  Nelson Noone Poteet 09/03/2012,1:12 PM

## 2012-09-03 NOTE — ED Notes (Signed)
Neuro and PV assessment done at 0900.  Charted late.

## 2012-09-03 NOTE — Progress Notes (Addendum)
Heparin order no longer on MAR. Confirmed that Dr. Corliss Skains wants to continue heparin. He does. Heparin has not been stopped. Spoke with pharmacy, pharmacy to place correct order back in EPIC.   Pt has been on Heparin 500 unit/hr since her arrival to unit.

## 2012-09-03 NOTE — Anesthesia Preprocedure Evaluation (Signed)
Anesthesia Evaluation  Patient identified by MRN, date of birth, ID band Patient awake    Reviewed: Allergy & Precautions, H&P , NPO status , Patient's Chart, lab work & pertinent test results  History of Anesthesia Complications (+) PONV  Airway Mallampati: II TM Distance: >3 FB Neck ROM: Full    Dental no notable dental hx. (+) Teeth Intact and Dental Advisory Given   Pulmonary sleep apnea ,  breath sounds clear to auscultation  Pulmonary exam normal       Cardiovascular + Peripheral Vascular Disease negative cardio ROS  Rhythm:Regular Rate:Normal     Neuro/Psych  Headaches, negative psych ROS   GI/Hepatic negative GI ROS, Neg liver ROS,   Endo/Other  negative endocrine ROS  Renal/GU negative Renal ROS  negative genitourinary   Musculoskeletal   Abdominal   Peds  Hematology negative hematology ROS (+)   Anesthesia Other Findings   Reproductive/Obstetrics negative OB ROS                           Anesthesia Physical Anesthesia Plan  ASA: II  Anesthesia Plan: General   Post-op Pain Management:    Induction: Intravenous  Airway Management Planned: Oral ETT  Additional Equipment: Arterial line  Intra-op Plan:   Post-operative Plan: Extubation in OR  Informed Consent: I have reviewed the patients History and Physical, chart, labs and discussed the procedure including the risks, benefits and alternatives for the proposed anesthesia with the patient or authorized representative who has indicated his/her understanding and acceptance.   Dental advisory given  Plan Discussed with: CRNA  Anesthesia Plan Comments:         Anesthesia Quick Evaluation

## 2012-09-03 NOTE — Anesthesia Procedure Notes (Signed)
Procedure Name: Intubation Date/Time: 09/03/2012 9:15 AM Performed by: Brien Mates DOBSON Pre-anesthesia Checklist: Patient identified, Suction available, Emergency Drugs available, Timeout performed and Patient being monitored Patient Re-evaluated:Patient Re-evaluated prior to inductionOxygen Delivery Method: Circle system utilized Preoxygenation: Pre-oxygenation with 100% oxygen Intubation Type: IV induction Ventilation: Mask ventilation without difficulty Laryngoscope Size: Miller and 2 Grade View: Grade I Tube type: Oral Number of attempts: 1 Airway Equipment and Method: Stylet Placement Confirmation: ETT inserted through vocal cords under direct vision,  positive ETCO2 and breath sounds checked- equal and bilateral Secured at: 21 cm Tube secured with: Tape Dental Injury: Teeth and Oropharynx as per pre-operative assessment

## 2012-09-03 NOTE — Procedures (Signed)
S/P RT vert artery arteriogram followed by stent assisted coiling of basilar artery aneurysm.

## 2012-09-03 NOTE — Anesthesia Postprocedure Evaluation (Signed)
  Anesthesia Post-op Note  Patient: Shannon Yu  Procedure(s) Performed: Procedure(s) with comments: RADIOLOGY WITH ANESTHESIA (N/A) - aneurysm embolization   Patient Location: PACU  Anesthesia Type:General  Level of Consciousness: awake and alert   Airway and Oxygen Therapy: Patient Spontanous Breathing and Patient connected to nasal cannula oxygen  Post-op Pain: mild  Post-op Assessment: Post-op Vital signs reviewed, Patient's Cardiovascular Status Stable, Respiratory Function Stable and Patent Airway  Post-op Vital Signs: Reviewed and stable  Complications: No apparent anesthesia complications

## 2012-09-03 NOTE — Progress Notes (Signed)
ANTICOAGULATION CONSULT NOTE  Pharmacy Consult for heparin Indication: s/p stent assisted coiling of basilar artery aneurysm Patient Measurements:   Heparin Dosing Weight: 73.2 kg   Labs:  Recent Labs  09/03/12 2100  HEPARINUNFRC 0.30    The CrCl is unknown because both a height and weight (above a minimum accepted value) are required for this calculation.  Assessment: [name] with heparin level slightly greater than goal - on heparin s/p coiling of aneurysm.  No complications noted.  Confirmed with RN that heparin drip is currently running at the ordered rate of 700 units/hr.  Goal of Therapy:  Heparin level 0.1-0.25 units/ml Monitor platelets by anticoagulation protocol: Yes   Plan:  - Decrease drip to 600 units/hr - Check HL 7 hours after rate change - Daily HL and CBC while on heparin  Shannon Yu L. Illene Bolus, PharmD, BCPS Clinical Pharmacist Pager: [phone] Pharmacy: [phone] 09/03/2012 9:51 PM

## 2012-09-04 ENCOUNTER — Encounter (HOSPITAL_COMMUNITY): Payer: Self-pay | Admitting: *Deleted

## 2012-09-04 LAB — BASIC METABOLIC PANEL
CO2: 24 mEq/L (ref 19–32)
Calcium: 8.5 mg/dL (ref 8.4–10.5)
Chloride: 108 mEq/L (ref 96–112)
Glucose, Bld: 94 mg/dL (ref 70–99)
Sodium: 139 mEq/L (ref 135–145)

## 2012-09-04 LAB — HEPARIN LEVEL (UNFRACTIONATED): Heparin Unfractionated: 0.2 IU/mL — ABNORMAL LOW (ref 0.30–0.70)

## 2012-09-04 LAB — CBC WITH DIFFERENTIAL/PLATELET
Eosinophils Relative: 0 % (ref 0–5)
HCT: 33.9 % — ABNORMAL LOW (ref 36.0–46.0)
Lymphocytes Relative: 32 % (ref 12–46)
Lymphs Abs: 2.3 10*3/uL (ref 0.7–4.0)
MCV: 94.7 fL (ref 78.0–100.0)
Monocytes Absolute: 0.4 10*3/uL (ref 0.1–1.0)
Neutro Abs: 4.4 10*3/uL (ref 1.7–7.7)
Platelets: 187 10*3/uL (ref 150–400)
RBC: 3.58 MIL/uL — ABNORMAL LOW (ref 3.87–5.11)
WBC: 7.1 10*3/uL (ref 4.0–10.5)

## 2012-09-04 NOTE — Progress Notes (Signed)
ANTICOAGULATION CONSULT NOTE  Pharmacy Consult for heparin Indication: s/p stent assisted coiling of basilar artery aneurysm Patient Measurements:   Heparin Dosing Weight: 73.2 kg   Labs:  Recent Labs  09/03/12 2100 09/04/12 0420  HGB  --  11.7*  HCT  --  33.9*  PLT  --  187  HEPARINUNFRC 0.30 0.20*  CREATININE  --  0.67    The CrCl is unknown because both a height and weight (above a minimum accepted value) are required for this calculation.  Assessment: 56 yo F with heparin level in goal range - on heparin s/p coiling of aneurysm.  No complications noted.   Goal of Therapy:  Heparin level 0.1-0.25 units/ml Monitor platelets by anticoagulation protocol: Yes   Plan:  - Cont drip at 600 units/hr - Daily HL and CBC while on heparin  Talbert Cage, PharmD Clinical Pharmacist Pager: [phone] Pharmacy: [phone] 09/04/2012 8:12 AM

## 2012-09-04 NOTE — Progress Notes (Signed)
Patient being discharged home. Patient has voided, walked around unit and vital signs are stable. Patient given discharge instructions with teach back method. Patient verbalizes s/s of stroke and when to call doctor. Patient transported to car in wheelchair with NT and husband.

## 2012-09-04 NOTE — Progress Notes (Signed)
1 Day Post-Op  Subjective: Resting comfortably Basilar artery additional coiling and stent placement 2/26  Pt has done well overnight 1 episode of vomiting last pm None since Has eaten crackers and clear liquids without nausea this am   Objective: Vital signs in last 24 hours: Temp:  [97.5 F (36.4 C)-98.6 F (37 C)] 98.6 F (37 C) (02/27 0700) Pulse Rate:  [63-104] 104 (02/27 0830) Resp:  [10-28] 26 (02/27 0830) BP: (93-134)/(55-90) 124/72 mmHg (02/27 0800) SpO2:  [93 %-100 %] 97 % (02/27 0830) Arterial Line BP: (102-150)/(55-73) 129/63 mmHg (02/27 0830)    Intake/Output from previous day: 02/26 0701 - 02/27 0700 In: 3407.3 [P.O.:120; I.V.:3187.3] Out: 4220 [Urine:4200; Blood:20] Intake/Output this shift: Total I/O In: 162.9 [I.V.:162.9] Out: -   PE:  afeb bp stable VSS EOMI; A/O Appropriate Face symmetrical Tongue midline Good strength all 4s; = Rt groin NT; no bleeding; No hematoma Rt foot 2+ pulses  Lab Results:   Recent Labs  09/04/12 0420  WBC 7.1  HGB 11.7*  HCT 33.9*  PLT 187   BMET  Recent Labs  09/04/12 0420  NA 139  K 3.4*  CL 108  CO2 24  GLUCOSE 94  BUN 6  CREATININE 0.67  CALCIUM 8.5   PT/INR No results found for this basename: LABPROT, INR,  in the last 72 hours ABG No results found for this basename: PHART, PCO2, PO2, HCO3,  in the last 72 hours  Studies/Results: No results found.  Anti-infectives: Anti-infectives   Start     Dose/Rate Route Frequency Ordered Stop   09/03/12 0630  ceFAZolin (ANCEF) IVPB 1 g/50 mL premix     1 g 100 mL/hr over 30 Minutes Intravenous  Once 09/03/12 0618 09/03/12 0927   09/03/12 0606  ceFAZolin (ANCEF) 2-3 GM-% IVPB SOLR  Status:  Discontinued    Comments:  CRABTREE, TERESA: cabinet override      09/03/12 0606 09/03/12 0715      Assessment/Plan: s/p Procedure(s) with comments: RADIOLOGY WITH ANESTHESIA (N/A) - aneurysm embolization    LOS: 1 day    Basilar artery aneurysm  coil/stent placement 2/26 Plan for dc today Sheath removal today Dr Corliss Skains seeing pt now  [facility] A 09/04/2012

## 2012-09-04 NOTE — Progress Notes (Signed)
09/04/2012   0945   Interventional Radiology Note. Pt id'ed using name and DOB,  Area prepped and draped.   Removal of right femoral 7Fr shealth,  6Fr  ExoSeal used pressure applied until hemostasis achieved. Shanda Bumps RN in room. Distal pulses on right foot unchanged at +2. Sand bag on the way [facility]

## 2012-09-04 NOTE — Discharge Summary (Signed)
Physician Discharge Summary  Patient ID: Shannon Yu MRN: [id number] DOB/AGE: 1956-04-08 57 y.o.  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 Recheck cerebral arteriogram 08/2012 revealed neck remnant at aneurysm. Additional coiling and stent placement performed 09/03/12. Overnight stay in Neuro ICU. Pt had 1 episode of vomiting last pm; none since. Eating and drinking well now. No complaints. No headache. Slept well. UOP good; yellow and clear. Passing gas. Neuro intact. Dr Corliss Skains has seen and examined pt. Continue all home meds. ASA and Plavix daily. DC instructions to pt with good understanding.  Consults: None  Significant Diagnostic Studies: Cerebral arteriogram  Treatments: Additional coiling and stent placement of basilar artery aneurysm  Discharge Exam: Blood pressure 129/64, pulse 101, temperature 98.6 F (37 C), temperature source Oral, resp. rate 19, SpO2 97.00%.  PE:  Afeb; VSS A/O Appropriate Face symmetrical; tongue midline Moves all 4s = Good strength Heart: RRR Lungs: CTA  Abd: soft+ BS Rt groin: sheath intact; no bleeding; no hematoma removal this am  Results for orders placed during the hospital encounter of 09/03/12  CBC WITH DIFFERENTIAL      Result Value Range   WBC 7.1  4.0 - 10.5 K/uL   RBC 3.58 (*) 3.87 - 5.11 MIL/uL   Hemoglobin 11.7 (*) 12.0 - 15.0 g/dL   HCT 13.0 (*) 86.5 - 78.4 %   MCV 94.7  78.0 - 100.0 fL   MCH 32.7  26.0 - 34.0 pg   MCHC 34.5  30.0 - 36.0 g/dL   RDW 69.6  29.5 - 28.4 %   Platelets 187  150 - 400 K/uL   Neutrophils Relative 62  43 - 77 %   Neutro Abs 4.4  1.7 - 7.7 K/uL   Lymphocytes Relative 32  12 - 46 %   Lymphs Abs 2.3  0.7 - 4.0 K/uL   Monocytes Relative 6  3 -  12 %   Monocytes Absolute 0.4  0.1 - 1.0 K/uL   Eosinophils Relative 0  0 - 5 %   Eosinophils Absolute 0.0  0.0 - 0.7 K/uL   Basophils Relative 0  0 - 1 %   Basophils Absolute 0.0  0.0 - 0.1 K/uL  BASIC METABOLIC PANEL      Result Value Range   Sodium 139  135 - 145 mEq/L   Potassium 3.4 (*) 3.5 - 5.1 mEq/L   Chloride 108  96 - 112 mEq/L   CO2 24  19 - 32 mEq/L   Glucose, Bld 94  70 - 99 mg/dL   BUN 6  6 - 23 mg/dL   Creatinine, Ser 1.32  0.50 - 1.10 mg/dL   Calcium 8.5  8.4 - 44.0 mg/dL   GFR calc non Af Amer >90  >90 mL/min   GFR calc Af Amer >90  >90 mL/min  HEPARIN LEVEL (UNFRACTIONATED)      Result Value Range   Heparin Unfractionated 0.20 (*) 0.30 - 0.70 IU/mL    Disposition: Basilar artery aneurysm neck remnant coiling and stent placement performed By Dr Corliss Skains 09/03/12. Pt tolerated well Plan for dc now Pt has been seen and examined by Dr Corliss Skains. DC instructions to pt with good understanding Continue all meds Continue asa/plavix Out of work x 2 weeks Follow up appt 09/17/12 with Dr  Deveshwar Call [phone] if questions or concerns  Discharge Orders   Future Appointments Provider Department Dept Phone   09/17/2012 1:00 PM [facility] INTERVENTIONAL RADIOLOGY [phone]   Future Orders Complete By Expires     Call MD for:  difficulty breathing, headache or visual disturbances  As directed     Call MD for:  persistant dizziness or light-headedness  As directed     Call MD for:  persistant nausea and vomiting  As directed     Call MD for:  redness, tenderness, or signs of infection (pain, swelling, redness, odor or green/yellow discharge around incision site)  As directed     Call MD for:  severe uncontrolled pain  As directed     Call MD for:  temperature >100.4  As directed     Diet - low sodium heart healthy  As directed     Discharge instructions  As directed     Comments:      Follow up with Dr Corliss Skains 09/17/12; [facility] 1245pm;  call [phone] if questions    Discharge wound care:  As directed     Comments:      May shower this evening with bandage in place; remove and replace with band aid daily x 5 days    Driving Restrictions  As directed     Comments:      No driving x 2 weeks    Increase activity slowly  As directed     Lifting restrictions  As directed     Comments:      No lifting over 10 lbs x 2 weeks    Other Restrictions  As directed     Comments:      Out of work x 2 weeks; pt to see Dr Corliss Skains 09/17/12        Medication List    TAKE these medications       aspirin EC 81 MG tablet  Take 81 mg by mouth daily.     clopidogrel 75 MG tablet  Commonly known as:  PLAVIX  Take 75 mg by mouth daily.     GLUCOSAMINE 1500 COMPLEX PO  Take 1 capsule by mouth daily.     JINTELI 1-5 MG-MCG Tabs  Generic drug:  norethindrone-ethinyl estradiol  Take 1 tablet by mouth daily.         Signed: Jerome Viglione A 09/04/2012, 10:56 AM

## 2012-09-04 NOTE — Progress Notes (Signed)
UR completed 

## 2012-09-17 ENCOUNTER — Ambulatory Visit (HOSPITAL_COMMUNITY): Admit: 2012-09-17 | Payer: 59

## 2012-09-26 ENCOUNTER — Ambulatory Visit (HOSPITAL_COMMUNITY)
Admission: RE | Admit: 2012-09-26 | Discharge: 2012-09-26 | Disposition: A | Discharge status: Home / Self Care | Payer: Commercial Managed Care - PPO | Source: Ambulatory Visit | Attending: Interventional Radiology | Admitting: Interventional Radiology

## 2012-11-21 ENCOUNTER — Ambulatory Visit (INDEPENDENT_AMBULATORY_CARE_PROVIDER_SITE_OTHER): Payer: Commercial Managed Care - PPO | Admitting: Internal Medicine

## 2012-11-21 ENCOUNTER — Encounter: Payer: Self-pay | Admitting: Internal Medicine

## 2012-11-21 VITALS — BP 126/80 | HR 90 | Ht 67.5 in | Wt 169.0 lb

## 2012-11-21 DIAGNOSIS — G4733 Obstructive sleep apnea (adult) (pediatric): Secondary | ICD-10-CM

## 2012-11-21 NOTE — Progress Notes (Signed)
06/26/11- 55 yoFnever smoker seeking evaluation for sleep apnea. Boyfriend describes loud snore and witnessed apnea. She feels sleep quality is poor and unrestful since menopause at age 57. Tylenol PM helps her fall asleep. She had been evaluated here in the past for allergic rhinitis. Nasal congestion contributes to her snoring. She avoids naps. One cup of coffee in the morning only. Bedtime between 8 and 10 PM based on when she plays tennis. Short sleep latency, waking once before up between 5:30 and 5:45 AM. Weight is up about 8 pounds in the last 2 years. No ENT surgery. No history of cardiopulmonary disease. Brother uses CPAP for sleep apnea.  07/25/11-  55 yoFnever smoker followed for OSA, allergic rhinitis NPSG 07/06/11- Mild OSA AHI 7.8/ hr, nonpositional, loud snoring, desat to 83%.   we discussed obstructive sleep apnea in this range , medical significance and spectrum of available treatments. She is not much concerned and that is appropriate. Medical impact is unlikely to be much as long as she doesn't gain weight. We agreed not to treat at this time.  11/21/12- 56 yoFnever smoker followed for OSA, allergic rhinitis, hx CVA/ aneurysm FOLLOWS FOR: not currently using CPAP machine. Had a ruptured brain anyurseum in 05/2012. Feels like she is extremely tired since that happened. Blames upset about sister in law died of lung cancer for stress leading to ruptured brain aneurysm-coiled. Since then stays tired, had to stop exercise program. Wants to try CPAP to regain energy.  ROS-see HPI Constitutional:   No-   weight loss, night sweats, fevers, chills, fatigue, lassitude. HEENT:   No-  headaches, difficulty swallowing, tooth/dental problems, sore throat,       Some  sneezing, itching, ear ache, nasal congestion, post nasal drip,  CV:  No-   chest pain, orthopnea, PND, swelling in lower extremities, anasarca, dizziness, palpitations Resp: No-   shortness of breath with exertion or at rest.           No-   productive cough,  No non-productive cough,  No- coughing up of blood.              No-   change in color of mucus.  No- wheezing.   Skin: No-   rash or lesions. GI:  No-   heartburn, indigestion, abdominal pain, nausea, vomiting,  GU: MS:  Neuro-     nothing unusual Psych:  No- change in mood or affect. No depression or anxiety.  No memory loss.  OBJ General- Alert, Oriented, Affect-appropriate, Distress- none acute, tall Skin- rash-none, lesions- none, excoriation- none Lymphadenopathy- none Head- atraumatic            Eyes- Gross vision intact, PERRLA, conjunctivae clear secretions            Ears- Hearing, canals-normal            Nose- Clear, no-Septal dev, mucus, polyps, erosion, perforation             Throat- Mallampati II-III , mucosa clear , drainage- none, tonsils- atrophic Neck- flexible , trachea midline, no stridor , thyroid nl, carotid no bruit Chest - symmetrical excursion , unlabored           Heart/CV- RRR , no murmur , no gallop  , no rub, nl s1 s2                           - JVD- none , edema- none, stasis changes- none, varices-  none           Lung- clear to P&A, wheeze- none, cough- none , dullness-none, rub- none           Chest wall-  Abd- Br/ Gen/ Rectal- Not done, not indicated Extrem- cyanosis- none, clubbing, none, atrophy- none, strength- nl Neuro- grossly intact to observation

## 2012-11-21 NOTE — Patient Instructions (Addendum)
Order- new CPAP autotitrate for pressure recommendation 5-15 cwp x 7 days, mask of choice, humidifier   Dx OSA  Please call as needed

## 2012-11-27 ENCOUNTER — Telehealth: Payer: Self-pay | Admitting: Internal Medicine

## 2012-11-27 NOTE — Telephone Encounter (Signed)
Note  Order faxed to [facility] for new cpap set up pt aware Tobe Sos   I spoke with pt and made her aware will contact [facility] in the AM to the status of this. She voiced her understanding and needed nothing further at this time. WCB [facility] in AM when they re-open

## 2012-11-28 NOTE — Telephone Encounter (Signed)
I called [facility] and spoke with [place] and she states that they were needing a copy of the pt sleep study and they received this yesterday so they have submitted everything to insurance for approval and once they get approval they will contact the pt for setup. I [id number] to advise the pt. Carron Curie, CMA

## 2012-12-02 NOTE — Assessment & Plan Note (Signed)
Don't recognize sleep hygiene problem. "Tiredness" began after aneurysm rupture, so it may not respond to treatment for OSA, but we can try. Discussed. Plan- Start CPAP with autotitration.

## 2012-12-02 NOTE — Telephone Encounter (Signed)
Pt is aware. Shannon Yu, CMA  

## 2012-12-11 ENCOUNTER — Other Ambulatory Visit (HOSPITAL_COMMUNITY): Payer: Self-pay | Admitting: Interventional Radiology

## 2012-12-11 DIAGNOSIS — I729 Aneurysm of unspecified site: Secondary | ICD-10-CM

## 2012-12-15 ENCOUNTER — Other Ambulatory Visit: Payer: Self-pay | Admitting: Radiology

## 2012-12-23 ENCOUNTER — Encounter (HOSPITAL_COMMUNITY): Payer: Self-pay | Admitting: Pharmacy Technician

## 2012-12-26 ENCOUNTER — Ambulatory Visit (HOSPITAL_COMMUNITY)
Admission: RE | Admit: 2012-12-26 | Discharge: 2012-12-26 | Disposition: A | Discharge status: Home / Self Care | Payer: Commercial Managed Care - PPO | Source: Ambulatory Visit | Attending: Interventional Radiology | Admitting: Interventional Radiology

## 2012-12-26 ENCOUNTER — Telehealth: Payer: Self-pay | Admitting: Internal Medicine

## 2012-12-26 ENCOUNTER — Encounter (HOSPITAL_COMMUNITY): Payer: Self-pay

## 2012-12-26 ENCOUNTER — Other Ambulatory Visit (HOSPITAL_COMMUNITY): Payer: Self-pay | Admitting: Interventional Radiology

## 2012-12-26 DIAGNOSIS — I729 Aneurysm of unspecified site: Secondary | ICD-10-CM

## 2012-12-26 DIAGNOSIS — M129 Arthropathy, unspecified: Secondary | ICD-10-CM | POA: Insufficient documentation

## 2012-12-26 DIAGNOSIS — I671 Cerebral aneurysm, nonruptured: Secondary | ICD-10-CM | POA: Insufficient documentation

## 2012-12-26 DIAGNOSIS — G4733 Obstructive sleep apnea (adult) (pediatric): Secondary | ICD-10-CM

## 2012-12-26 DIAGNOSIS — G4730 Sleep apnea, unspecified: Secondary | ICD-10-CM | POA: Insufficient documentation

## 2012-12-26 DIAGNOSIS — Z9889 Other specified postprocedural states: Secondary | ICD-10-CM | POA: Insufficient documentation

## 2012-12-26 DIAGNOSIS — G43909 Migraine, unspecified, not intractable, without status migrainosus: Secondary | ICD-10-CM | POA: Insufficient documentation

## 2012-12-26 DIAGNOSIS — Z87891 Personal history of nicotine dependence: Secondary | ICD-10-CM | POA: Insufficient documentation

## 2012-12-26 LAB — CBC WITH DIFFERENTIAL/PLATELET
Basophils Absolute: 0 10*3/uL (ref 0.0–0.1)
Basophils Relative: 0 % (ref 0–1)
Eosinophils Absolute: 0.1 10*3/uL (ref 0.0–0.7)
HCT: 38.2 % (ref 36.0–46.0)
MCH: 32.7 pg (ref 26.0–34.0)
MCHC: 34.3 g/dL (ref 30.0–36.0)
Monocytes Absolute: 0.4 10*3/uL (ref 0.1–1.0)
Monocytes Relative: 7 % (ref 3–12)
Neutro Abs: 2.7 10*3/uL (ref 1.7–7.7)
RDW: 12.7 % (ref 11.5–15.5)

## 2012-12-26 LAB — BASIC METABOLIC PANEL
BUN: 15 mg/dL (ref 6–23)
Chloride: 106 mEq/L (ref 96–112)
Creatinine, Ser: 0.86 mg/dL (ref 0.50–1.10)
GFR calc Af Amer: 87 mL/min — ABNORMAL LOW (ref >90)
GFR calc non Af Amer: 75 mL/min — ABNORMAL LOW (ref >90)
Glucose, Bld: 91 mg/dL (ref 70–99)

## 2012-12-26 MED ORDER — SODIUM CHLORIDE 0.9 % IV SOLN: INTRAVENOUS | Status: AC

## 2012-12-26 MED ORDER — IOHEXOL 300 MG/ML  SOLN
150.0000 mL | Freq: Once | INTRAMUSCULAR | Status: AC | PRN
  Administered 2012-12-26: 40 mL via INTRA_ARTERIAL

## 2012-12-26 MED ORDER — HEPARIN SOD (PORK) LOCK FLUSH 100 UNIT/ML IV SOLN
INTRAVENOUS | Status: DC | PRN
  Administered 2012-12-26: 500 [IU] via INTRAVENOUS

## 2012-12-26 MED ORDER — MIDAZOLAM HCL 2 MG/2ML IJ SOLN
INTRAMUSCULAR | Status: DC | PRN
  Administered 2012-12-26: 1 mg via INTRAVENOUS

## 2012-12-26 MED ORDER — FENTANYL CITRATE 0.05 MG/ML IJ SOLN
INTRAMUSCULAR | Status: AC
  Filled 2012-12-26: qty 2

## 2012-12-26 MED ORDER — SODIUM CHLORIDE 0.9 % IV SOLN
Freq: Once | INTRAVENOUS | Status: AC
  Administered 2012-12-26: 09:00:00 via INTRAVENOUS

## 2012-12-26 MED ORDER — MIDAZOLAM HCL 2 MG/2ML IJ SOLN
INTRAMUSCULAR | Status: AC
  Filled 2012-12-26: qty 2

## 2012-12-26 MED ORDER — FENTANYL CITRATE 0.05 MG/ML IJ SOLN
INTRAMUSCULAR | Status: DC | PRN
  Administered 2012-12-26: 25 ug via INTRAVENOUS

## 2012-12-26 NOTE — Procedures (Addendum)
S/P bilateral vert artery angiograms . RT CFA approach Findings. Obliterated basilar apex aneurysm

## 2012-12-26 NOTE — H&P (Signed)
Shannon Yu is an 57 y.o. female.   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 Menopause   . Environmental allergies   . Migraine   . PONV (postoperative nausea and vomiting)   . Sleep apnea   . Arthritis     left knee    Past Surgical History  Procedure Laterality Date  . Knee arthroscopy      Left  . Aneurysm coiling  05/09/2012    middle  . Radiology with anesthesia N/A 09/03/2012    Procedure: RADIOLOGY WITH ANESTHESIA;  Surgeon: Oneal Grout, MD;  Location: [facility] OR;  Service: Radiology;  Laterality: N/A;  aneurysm embolization     Family History  Problem Relation Age of Onset  . Emphysema Father     smoker  . Lung cancer Father   . Prostate cancer Father   . Kidney disease Father    Social History:  reports that she has quit smoking. Her smoking use included Cigarettes. She has a 2.5 pack-year smoking history. She has never used smokeless tobacco. She reports that  drinks alcohol. She reports that she does not use illicit drugs.  Allergies: No Known Allergies   (Not in a hospital admission)  No results found for this or any previous visit (from the past 48 hour(s)). No results found.  Review of Systems  Constitutional: Positive for malaise/fatigue. Negative for fever.  HENT: Negative for neck pain.   Eyes:       One episode of visual disturbance- R eye was seeing below midline; L eye was normal---occurred 1-2 months ago; none since; lasted 1 minute  Respiratory: Negative for cough.   Cardiovascular: Negative for chest pain.  Gastrointestinal: Negative for nausea, vomiting and abdominal pain.  Neurological: Negative for dizziness, tingling and headaches.     Blood pressure 127/78, pulse 77, temperature 97.7 F (36.5 C), temperature source Oral, resp. rate 18, height 5' 8.5" (1.74 m), weight 170 lb (77.111 kg), SpO2 100.00%. Physical Exam  Constitutional: She is oriented to person, place, and time. She appears well-developed and well-nourished.  Cardiovascular: Normal rate and regular rhythm.   No murmur heard. Respiratory: Effort normal and breath sounds normal. She has no wheezes.  GI: Soft. Bowel sounds are normal. There is no tenderness.  Musculoskeletal: Normal range of motion.  Neurological: She is alert and oriented to person, place, and time. No cranial nerve deficit.  Skin: Skin is warm and dry.  Psychiatric: She has a normal mood and affect. Her behavior is normal. Judgment and thought content normal.     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

## 2012-12-26 NOTE — Telephone Encounter (Signed)
Pt aware and order has been placed to [facility].

## 2013-01-01 ENCOUNTER — Telehealth: Payer: Self-pay | Admitting: Internal Medicine

## 2013-01-01 NOTE — Telephone Encounter (Signed)
Ok to work in.

## 2013-01-01 NOTE — Telephone Encounter (Signed)
Pt would like to est her with Dr. Felicity Coyer.  She wants to come sooner than Aug. Or November.  She has been using her OBGYN as her primary care provider.  She had a brain aneurism last fall and needs someone to monitor her blood pressure. She has [employer].

## 2013-01-02 ENCOUNTER — Encounter: Payer: Self-pay | Admitting: Internal Medicine

## 2013-01-02 NOTE — Telephone Encounter (Signed)
Appt on July 11.

## 2013-01-15 ENCOUNTER — Ambulatory Visit (INDEPENDENT_AMBULATORY_CARE_PROVIDER_SITE_OTHER): Payer: Commercial Managed Care - PPO | Admitting: Internal Medicine

## 2013-01-15 ENCOUNTER — Encounter: Payer: Self-pay | Admitting: Internal Medicine

## 2013-01-15 VITALS — BP 110/76 | HR 74 | Ht 68.0 in | Wt 176.0 lb

## 2013-01-15 DIAGNOSIS — G4733 Obstructive sleep apnea (adult) (pediatric): Secondary | ICD-10-CM

## 2013-01-15 MED ORDER — AZELASTINE-FLUTICASONE 137-50 MCG/ACT NA SUSP: 1.0000 | Freq: Every day | NASAL | Status: DC

## 2013-01-15 NOTE — Patient Instructions (Addendum)
We can continue CPAP 13/ [facility]  Sample and script for Dymista nasal spray      1-2 puffs each nostril once daily at bedtime

## 2013-01-15 NOTE — Progress Notes (Signed)
06/26/11- 57 yoFnever smoker seeking evaluation for sleep apnea. Boyfriend describes loud snore and witnessed apnea. She feels sleep quality is poor and unrestful since menopause at age 57. Tylenol PM helps her fall asleep. She had been evaluated here in the past for allergic rhinitis. Nasal congestion contributes to her snoring. She avoids naps. One cup of coffee in the morning only. Bedtime between 8 and 10 PM based on when she plays tennis. Short sleep latency, waking once before up between 5:30 and 5:45 AM. Weight is up about 8 pounds in the last 2 years. No ENT surgery. No history of cardiopulmonary disease. Brother uses CPAP for sleep apnea.  07/25/11-  57 yoFnever smoker followed for OSA, allergic rhinitis NPSG 07/06/11- Mild OSA AHI 7.8/ hr, nonpositional, loud snoring, desat to 83%.   we discussed obstructive sleep apnea in this range , medical significance and spectrum of available treatments. She is not much concerned and that is appropriate. Medical impact is unlikely to be much as long as she doesn't gain weight. We agreed not to treat at this time.  11/21/12- 57 yoFnever smoker followed for OSA, allergic rhinitis, hx CVA/ aneurysm FOLLOWS FOR: not currently using CPAP machine. Had a ruptured brain anyurseum in 05/2012. Feels like she is extremely tired since that happened. Blames upset about sister in law died of lung cancer for stress leading to ruptured brain aneurysm-coiled. Since then stays tired, had to stop exercise program. Wants to try CPAP to regain energy.  7/101/4- 57 yoFnever smoker followed for OSA, allergic rhinitis, hx CVA/ aneurysm follows for:  currently using the CPAP13/ [facility]  nightly.  no problems.  She likes her CPAP. Comfort discussed. Less tired.  ROS-see HPI Constitutional:   No-   weight loss, night sweats, fevers, chills, fatigue, lassitude. HEENT:   No-  headaches, difficulty swallowing, tooth/dental problems, sore throat,       Some  sneezing, itching, ear  ache, nasal congestion, post nasal drip,  CV:  No-   chest pain, orthopnea, PND, swelling in lower extremities, anasarca, dizziness, palpitations Resp: No-   shortness of breath with exertion or at rest.              No-   productive cough,  No non-productive cough,  No- coughing up of blood.              No-   change in color of mucus.  No- wheezing.   Skin: No-   rash or lesions. GI:  No-   heartburn, indigestion, abdominal pain, nausea, vomiting,  GU: MS:  Neuro-     nothing unusual Psych:  No- change in mood or affect. No depression or anxiety.  No memory loss.  OBJ General- Alert, Oriented, Affect-appropriate, Distress- none acute, tall Skin- rash-none, lesions- none, excoriation- none Lymphadenopathy- none Head- atraumatic            Eyes- Gross vision intact, PERRLA, conjunctivae clear secretions            Ears- Hearing, canals-normal            Nose- Clear, no-Septal dev, mucus, polyps, erosion, perforation             Throat- Mallampati II-III , mucosa clear , drainage- none, tonsils- atrophic Neck- flexible , trachea midline, no stridor , thyroid nl, carotid no bruit Chest - symmetrical excursion , unlabored           Heart/CV- RRR , no murmur , no gallop  , no rub,  nl s1 s2                           - JVD- none , edema- none, stasis changes- none, varices- none           Lung- clear to P&A, wheeze- none, cough- none , dullness-none, rub- none           Chest wall-  Abd- Br/ Gen/ Rectal- Not done, not indicated Extrem- cyanosis- none, clubbing, none, atrophy- none, strength- nl Neuro- grossly intact to observation

## 2013-01-16 ENCOUNTER — Encounter: Payer: Self-pay | Admitting: Internal Medicine

## 2013-01-16 ENCOUNTER — Ambulatory Visit (INDEPENDENT_AMBULATORY_CARE_PROVIDER_SITE_OTHER): Payer: Commercial Managed Care - PPO | Admitting: Internal Medicine

## 2013-01-16 VITALS — BP 138/88 | HR 79 | Temp 98.1°F | Resp 16 | Ht 68.0 in | Wt 174.6 lb

## 2013-01-16 DIAGNOSIS — Z13820 Encounter for screening for osteoporosis: Secondary | ICD-10-CM

## 2013-01-16 DIAGNOSIS — M1990 Unspecified osteoarthritis, unspecified site: Secondary | ICD-10-CM | POA: Insufficient documentation

## 2013-01-16 DIAGNOSIS — Z0000 Encounter for general adult medical examination without abnormal findings: Secondary | ICD-10-CM

## 2013-01-16 NOTE — Patient Instructions (Signed)
It was good to see you today. We have reviewed your prior records including labs and tests today Health Maintenance reviewed - all recommended immunizations and age-appropriate screenings are up-to-date. we will send to your prior provider(s) for "release of records" as discussed today. Medications reviewed and updated, no changes recommended at this time. we'll make referral for bone density scan . Our office will contact you regarding appointment(s) once made. Please schedule followup in 12 months for annual physical and labs, call sooner if problems.   Health Maintenance, Females A healthy lifestyle and preventative care can promote health and wellness.  Maintain regular health, dental, and eye exams.  Eat a healthy diet. Foods like vegetables, fruits, whole grains, low-fat dairy products, and lean protein foods contain the nutrients you need without too many calories. Decrease your intake of foods high in solid fats, added sugars, and salt. Get information about a proper diet from your caregiver, if necessary.  Regular physical exercise is one of the most important things you can do for your health. Most adults should get at least 150 minutes of moderate-intensity exercise (any activity that increases your heart rate and causes you to sweat) each week. In addition, most adults need muscle-strengthening exercises on 2 or more days a week.   Maintain a healthy weight. The body mass index (BMI) is a screening tool to identify possible weight problems. It provides an estimate of body fat based on height and weight. Your caregiver can help determine your BMI, and can help you achieve or maintain a healthy weight. For adults 20 years and older:  A BMI below 18.5 is considered underweight.  A BMI of 18.5 to 24.9 is normal.  A BMI of 25 to 29.9 is considered overweight.  A BMI of 30 and above is considered obese.  Maintain normal blood lipids and cholesterol by exercising and minimizing your  intake of saturated fat. Eat a balanced diet with plenty of fruits and vegetables. Blood tests for lipids and cholesterol should begin at age 24 and be repeated every 5 years. If your lipid or cholesterol levels are high, you are over 50, or you are a high risk for heart disease, you may need your cholesterol levels checked more frequently.Ongoing high lipid and cholesterol levels should be treated with medicines if diet and exercise are not effective.  If you smoke, find out from your caregiver how to quit. If you do not use tobacco, do not start.  If you are pregnant, do not drink alcohol. If you are breastfeeding, be very cautious about drinking alcohol. If you are not pregnant and choose to drink alcohol, do not exceed 1 drink per day. One drink is considered to be 12 ounces (355 mL) of beer, 5 ounces (148 mL) of wine, or 1.5 ounces (44 mL) of liquor.  Avoid use of street drugs. Do not share needles with anyone. Ask for help if you need support or instructions about stopping the use of drugs.  High blood pressure causes heart disease and increases the risk of stroke. Blood pressure should be checked at least every 1 to 2 years. Ongoing high blood pressure should be treated with medicines, if weight loss and exercise are not effective.  If you are 25 to 56 years old, ask your caregiver if you should take aspirin to prevent strokes.  Diabetes screening involves taking a blood sample to check your fasting blood sugar level. This should be done once every 3 years, after age 54, if you are  within normal weight and without risk factors for diabetes. Testing should be considered at a younger age or be carried out more frequently if you are overweight and have at least 1 risk factor for diabetes.  Breast cancer screening is essential preventative care for women. You should practice "breast self-awareness." This means understanding the normal appearance and feel of your breasts and may include breast  self-examination. Any changes detected, no matter how small, should be reported to a caregiver. Women in their 47s and 30s should have a clinical breast exam (CBE) by a caregiver as part of a regular health exam every 1 to 3 years. After age 16, women should have a CBE every year. Starting at age 17, women should consider having a mammogram (breast X-ray) every year. Women who have a family history of breast cancer should talk to their caregiver about genetic screening. Women at a high risk of breast cancer should talk to their caregiver about having an MRI and a mammogram every year.  The Pap test is a screening test for cervical cancer. Women should have a Pap test starting at age 44. Between ages 91 and 24, Pap tests should be repeated every 2 years. Beginning at age 19, you should have a Pap test every 3 years as long as the past 3 Pap tests have been normal. If you had a hysterectomy for a problem that was not cancer or a condition that could lead to cancer, then you no longer need Pap tests. If you are between ages 40 and 53, and you have had normal Pap tests going back 10 years, you no longer need Pap tests. If you have had past treatment for cervical cancer or a condition that could lead to cancer, you need Pap tests and screening for cancer for at least 20 years after your treatment. If Pap tests have been discontinued, risk factors (such as a new sexual partner) need to be reassessed to determine if screening should be resumed. Some women have medical problems that increase the chance of getting cervical cancer. In these cases, your caregiver may recommend more frequent screening and Pap tests.  The human papillomavirus (HPV) test is an additional test that may be used for cervical cancer screening. The HPV test looks for the virus that can cause the cell changes on the cervix. The cells collected during the Pap test can be tested for HPV. The HPV test could be used to screen women aged 18 years and  older, and should be used in women of any age who have unclear Pap test results. After the age of 69, women should have HPV testing at the same frequency as a Pap test.  Colorectal cancer can be detected and often prevented. Most routine colorectal cancer screening begins at the age of 77 and continues through age 66. However, your caregiver may recommend screening at an earlier age if you have risk factors for colon cancer. On a yearly basis, your caregiver may provide home test kits to check for hidden blood in the stool. Use of a small camera at the end of a tube, to directly examine the colon (sigmoidoscopy or colonoscopy), can detect the earliest forms of colorectal cancer. Talk to your caregiver about this at age 92, when routine screening begins. Direct examination of the colon should be repeated every 5 to 10 years through age 40, unless early forms of pre-cancerous polyps or small growths are found.  Hepatitis C blood testing is recommended for all people born  from 1945 through 1965 and any individual with known risks for hepatitis C.  Practice safe sex. Use condoms and avoid high-risk sexual practices to reduce the spread of sexually transmitted infections (STIs). Sexually active women aged 42 and younger should be checked for Chlamydia, which is a common sexually transmitted infection. Older women with new or multiple partners should also be tested for Chlamydia. Testing for other STIs is recommended if you are sexually active and at increased risk.  Osteoporosis is a disease in which the bones lose minerals and strength with aging. This can result in serious bone fractures. The risk of osteoporosis can be identified using a bone density scan. Women ages 62 and over and women at risk for fractures or osteoporosis should discuss screening with their caregivers. Ask your caregiver whether you should be taking a calcium supplement or vitamin D to reduce the rate of osteoporosis.  Menopause can be  associated with physical symptoms and risks. Hormone replacement therapy is available to decrease symptoms and risks. You should talk to your caregiver about whether hormone replacement therapy is right for you.  Use sunscreen with a sun protection factor (SPF) of 30 or greater. Apply sunscreen liberally and repeatedly throughout the day. You should seek shade when your shadow is shorter than you. Protect yourself by wearing long sleeves, pants, a wide-brimmed hat, and sunglasses year round, whenever you are outdoors.  Notify your caregiver of new moles or changes in moles, especially if there is a change in shape or color. Also notify your caregiver if a mole is larger than the size of a pencil eraser.  Stay current with your immunizations. Document Released: 01/08/2011 Document Revised: 09/17/2011 Document Reviewed: 01/08/2011 [facility] Patient Information 2014 [place], [place].

## 2013-01-16 NOTE — Progress Notes (Signed)
Subjective:    Patient ID: Shannon Yu, female    DOB: 1956-01-26, 57 y.o.   MRN: [id number]  HPI  New pt to me - here to establish care Also here for annual medical physical and review - labs done with gyn early 2014  Past Medical History  Diagnosis Date  . Menopause   . Environmental allergies   . Migraine   . Sleep apnea 06/2011 dx    CPAP 13/[facility]  . Osteoarthritis     left knee  . 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 Emphysema Father     smoker  . Lung cancer Father 47  . Prostate cancer Father   . Kidney disease Father     CANCER  . Scleroderma Mother 24   History  Substance Use Topics  . Smoking status: Former Smoker -- 0.25 packs/day for 10 years    Types: Cigarettes  . 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 Alcohol Use: Yes     Comment: 1-2 drinks 3-5 times a week    Review of Systems Constitutional: Negative for fever or weight change.  Respiratory: Negative for cough and shortness of breath.   Cardiovascular: Negative for chest pain or palpitations.  Gastrointestinal: Negative for abdominal pain, no bowel changes.  Musculoskeletal: Negative for gait problem or joint swelling.  Skin: Negative for rash.  Neurological: Negative for dizziness or headache.  No other specific complaints in a complete review of systems (except as listed in HPI above).     Objective:   Physical Exam BP 138/88  Pulse 79  Temp(Src) 98.1 F (36.7 C) (Oral)  Resp 16  Ht 5\' 8"  (1.727 m)  Wt 174 lb 9.6 oz (79.198 kg)  BMI 26.55 kg/m2  SpO2 97% Wt Readings from Last 3 Encounters:  01/16/13 174 lb 9.6 oz (79.198 kg)  01/15/13 176 lb (79.833 kg)  12/26/12 170 lb (77.111 kg)   Constitutional: She appears well-developed and well-nourished. No distress.  HENT: Head: Normocephalic and atraumatic. Ears: B TMs ok, no erythema or effusion; Nose: Nose normal.  Mouth/Throat: Oropharynx is clear and moist. No oropharyngeal exudate.  Eyes: Conjunctivae and EOM are normal. Pupils are equal, round, and reactive to light. No scleral icterus.  Neck: Normal range of motion. Neck supple. No JVD present. No thyromegaly present.  Cardiovascular: Normal rate, regular rhythm and normal heart sounds.  No murmur heard. No BLE edema. Pulmonary/Chest: Effort normal and breath sounds normal. No respiratory distress. She has no wheezes.  Abdominal: Soft. Bowel sounds are normal. She exhibits no distension. There is no tenderness. no masses Musculoskeletal: Normal range of motion, no joint effusions. No gross deformities Neurological: She is alert and oriented to person, place, and time. No cranial nerve deficit. Coordination, balance, strength, speech and gait are normal.  Skin: Skin is warm and dry. No rash noted. No erythema.  Psychiatric: She has a normal mood and affect. Her behavior is normal. Judgment and thought content normal.   Lab Results  Component Value Date   WBC 5.4 12/26/2012   HGB 13.1 12/26/2012   HCT 38.2 12/26/2012   PLT 220 12/26/2012   GLUCOSE 91 12/26/2012   ALT 13 08/28/2012   AST 19 08/28/2012   NA 138 12/26/2012   K 3.7 12/26/2012   CL 106 12/26/2012   CREATININE 0.86 12/26/2012   BUN  15 12/26/2012   CO2 24 12/26/2012   INR 0.93 12/26/2012        Assessment & Plan:   CPX/v70.0 - Patient has been counseled on age-appropriate routine health concerns for screening and prevention. These are reviewed and up-to-date. Immunizations are up-to-date or declined. Labs reviewed. ROI requested from gyn (prior "pcp")

## 2013-01-22 ENCOUNTER — Ambulatory Visit (INDEPENDENT_AMBULATORY_CARE_PROVIDER_SITE_OTHER)
Admission: RE | Admit: 2013-01-22 | Discharge: 2013-01-22 | Disposition: A | Discharge status: Home / Self Care | Payer: Commercial Managed Care - PPO | Source: Ambulatory Visit | Attending: Internal Medicine | Admitting: Internal Medicine

## 2013-01-22 DIAGNOSIS — Z13820 Encounter for screening for osteoporosis: Secondary | ICD-10-CM

## 2013-01-27 ENCOUNTER — Encounter: Payer: Self-pay | Admitting: Internal Medicine

## 2013-01-29 NOTE — Assessment & Plan Note (Signed)
Good start and good compliance/ control with CPAP 13/ [facility]

## 2013-02-06 ENCOUNTER — Encounter: Payer: Self-pay | Admitting: Specialist

## 2013-04-08 ENCOUNTER — Other Ambulatory Visit: Payer: Self-pay

## 2013-04-08 DIAGNOSIS — Z1231 Encounter for screening mammogram for malignant neoplasm of breast: Secondary | ICD-10-CM

## 2013-05-11 ENCOUNTER — Ambulatory Visit
Admission: RE | Admit: 2013-05-11 | Discharge: 2013-05-11 | Disposition: A | Discharge status: Home / Self Care | Payer: Commercial Managed Care - PPO | Source: Ambulatory Visit

## 2013-05-11 DIAGNOSIS — Z1231 Encounter for screening mammogram for malignant neoplasm of breast: Secondary | ICD-10-CM

## 2013-05-14 ENCOUNTER — Other Ambulatory Visit: Payer: Self-pay

## 2013-05-18 ENCOUNTER — Encounter: Payer: Self-pay | Admitting: Internal Medicine

## 2013-05-21 ENCOUNTER — Encounter: Payer: Self-pay | Admitting: Internal Medicine

## 2013-05-21 ENCOUNTER — Other Ambulatory Visit (HOSPITAL_COMMUNITY): Payer: Self-pay | Admitting: Interventional Radiology

## 2013-05-21 DIAGNOSIS — I671 Cerebral aneurysm, nonruptured: Secondary | ICD-10-CM

## 2013-05-29 ENCOUNTER — Ambulatory Visit (HOSPITAL_COMMUNITY): Payer: Commercial Managed Care - PPO

## 2013-05-29 ENCOUNTER — Ambulatory Visit (HOSPITAL_COMMUNITY)
Admission: RE | Admit: 2013-05-29 | Discharge: 2013-05-29 | Disposition: A | Discharge status: Home / Self Care | Payer: Commercial Managed Care - PPO | Source: Ambulatory Visit | Attending: Interventional Radiology | Admitting: Interventional Radiology

## 2013-05-29 DIAGNOSIS — I671 Cerebral aneurysm, nonruptured: Secondary | ICD-10-CM

## 2013-05-29 DIAGNOSIS — Z8673 Personal history of transient ischemic attack (TIA), and cerebral infarction without residual deficits: Secondary | ICD-10-CM | POA: Insufficient documentation

## 2013-05-29 MED ORDER — GADOBENATE DIMEGLUMINE 529 MG/ML IV SOLN
20.0000 mL | Freq: Once | INTRAVENOUS | Status: AC
  Administered 2013-05-29: 17 mL via INTRAVENOUS

## 2013-06-05 ENCOUNTER — Other Ambulatory Visit (HOSPITAL_COMMUNITY): Payer: Self-pay | Admitting: Interventional Radiology

## 2013-06-05 DIAGNOSIS — I729 Aneurysm of unspecified site: Secondary | ICD-10-CM

## 2013-06-05 DIAGNOSIS — I639 Cerebral infarction, unspecified: Secondary | ICD-10-CM

## 2013-06-08 ENCOUNTER — Ambulatory Visit (HOSPITAL_COMMUNITY)
Admission: RE | Admit: 2013-06-08 | Discharge: 2013-06-08 | Disposition: A | Discharge status: Home / Self Care | Payer: Commercial Managed Care - PPO | Source: Ambulatory Visit | Attending: Interventional Radiology | Admitting: Interventional Radiology

## 2013-06-08 DIAGNOSIS — I729 Aneurysm of unspecified site: Secondary | ICD-10-CM

## 2013-06-08 DIAGNOSIS — I639 Cerebral infarction, unspecified: Secondary | ICD-10-CM

## 2013-07-24 ENCOUNTER — Ambulatory Visit (INDEPENDENT_AMBULATORY_CARE_PROVIDER_SITE_OTHER): Payer: Commercial Managed Care - PPO | Admitting: Internal Medicine

## 2013-07-24 ENCOUNTER — Encounter: Payer: Self-pay | Admitting: Internal Medicine

## 2013-07-24 VITALS — BP 124/74 | HR 77 | Ht 68.0 in | Wt 173.4 lb

## 2013-07-24 DIAGNOSIS — G4733 Obstructive sleep apnea (adult) (pediatric): Secondary | ICD-10-CM

## 2013-07-24 DIAGNOSIS — J309 Allergic rhinitis, unspecified: Secondary | ICD-10-CM

## 2013-07-24 NOTE — Patient Instructions (Signed)
We can continue CPAP 13/ [facility]  Please call if needed

## 2013-07-24 NOTE — Progress Notes (Signed)
06/26/11- 58 yoFnever smoker seeking evaluation for sleep apnea. Boyfriend describes loud snore and witnessed apnea. She feels sleep quality is poor and unrestful since menopause at age 58. Tylenol PM helps her fall asleep. She had been evaluated here in the past for allergic rhinitis. Nasal congestion contributes to her snoring. She avoids naps. One cup of coffee in the morning only. Bedtime between 8 and 10 PM based on when she plays tennis. Short sleep latency, waking once before up between 5:30 and 5:45 AM. Weight is up about 8 pounds in the last 2 years. No ENT surgery. No history of cardiopulmonary disease. Brother uses CPAP for sleep apnea.  07/25/11-  58 yoFnever smoker followed for OSA, allergic rhinitis NPSG 07/06/11- Mild OSA AHI 7.8/ hr, nonpositional, loud snoring, desat to 83%.   we discussed obstructive sleep apnea in this range , medical significance and spectrum of available treatments. She is not much concerned and that is appropriate. Medical impact is unlikely to be much as long as she doesn't gain weight. We agreed not to treat at this time.  11/21/12- 58 yoFnever smoker followed for OSA, allergic rhinitis, hx CVA/ aneurysm FOLLOWS FOR: not currently using CPAP machine. Had a ruptured brain anyurseum in 05/2012. Feels like she is extremely tired since that happened. Blames upset about sister in law died of lung cancer for stress leading to ruptured brain aneurysm-coiled. Since then stays tired, had to stop exercise program. Wants to try CPAP to regain energy.  7/101/4- 58 yoFnever smoker followed for OSA, allergic rhinitis, hx CVA/ aneurysm follows for:  currently using the CPAP13/ [facility]  nightly.  no problems.  She likes her CPAP. Comfort discussed. Less tired.  07/24/14- 58 yoFnever smoker followed for OSA, allergic rhinitis, hx CVA/ aneurysm FOLLOWS FOR: wears CPAP 13 nightly; DME is [facility]  Feels well controlled. Nasal pillows mask. No problems with nasal irritation  now  ROS-see HPI Constitutional:   No-   weight loss, night sweats, fevers, chills, fatigue, lassitude. HEENT:   No-  headaches, difficulty swallowing, tooth/dental problems, sore throat,       Some  sneezing, itching, ear ache, nasal congestion, post nasal drip,  CV:  No-   chest pain, orthopnea, PND, swelling in lower extremities, anasarca, dizziness, palpitations Resp: No-   shortness of breath with exertion or at rest.              No-   productive cough,  No non-productive cough,  No- coughing up of blood.              No-   change in color of mucus.  No- wheezing.   Skin: No-   rash or lesions. GI:  No-   heartburn, indigestion, abdominal pain, nausea, vomiting,  GU: MS:  Neuro-     nothing unusual Psych:  No- change in mood or affect. No depression or anxiety.  No memory loss.  OBJ General- Alert, Oriented, Affect-appropriate, Distress- none acute, tall Skin- rash-none, lesions- none, excoriation- none Lymphadenopathy- none Head- atraumatic            Eyes- Gross vision intact, PERRLA, conjunctivae clear secretions            Ears- Hearing, canals-normal            Nose- Clear, no-Septal dev, mucus, polyps, erosion, perforation             Throat- Mallampati II-III , mucosa clear , drainage- none, tonsils- atrophic Neck- flexible , trachea midline, no  stridor , thyroid nl, carotid no bruit Chest - symmetrical excursion , unlabored           Heart/CV- RRR , no murmur , no gallop  , no rub, nl s1 s2                           - JVD- none , edema- none, stasis changes- none, varices- none           Lung- clear to P&A, wheeze- none, cough- none , dullness-none, rub- none           Chest wall-  Abd- Br/ Gen/ Rectal- Not done, not indicated Extrem- cyanosis- none, clubbing, none, atrophy- none, strength- nl Neuro- grossly intact to observation

## 2013-08-18 NOTE — Assessment & Plan Note (Signed)
Well controlled, winter season, waiting for spring

## 2013-08-18 NOTE — Assessment & Plan Note (Signed)
Good compliance and control. No snoring and not sleepy

## 2013-10-19 IMAGING — CR DG ABD PORTABLE 1V
1 series · 1 of 1 positions shown · non-contrast
Comparison: None

CLINICAL DATA: Evaluate for ileus

PORTABLE ABDOMEN - 1 VIEW

[AP]
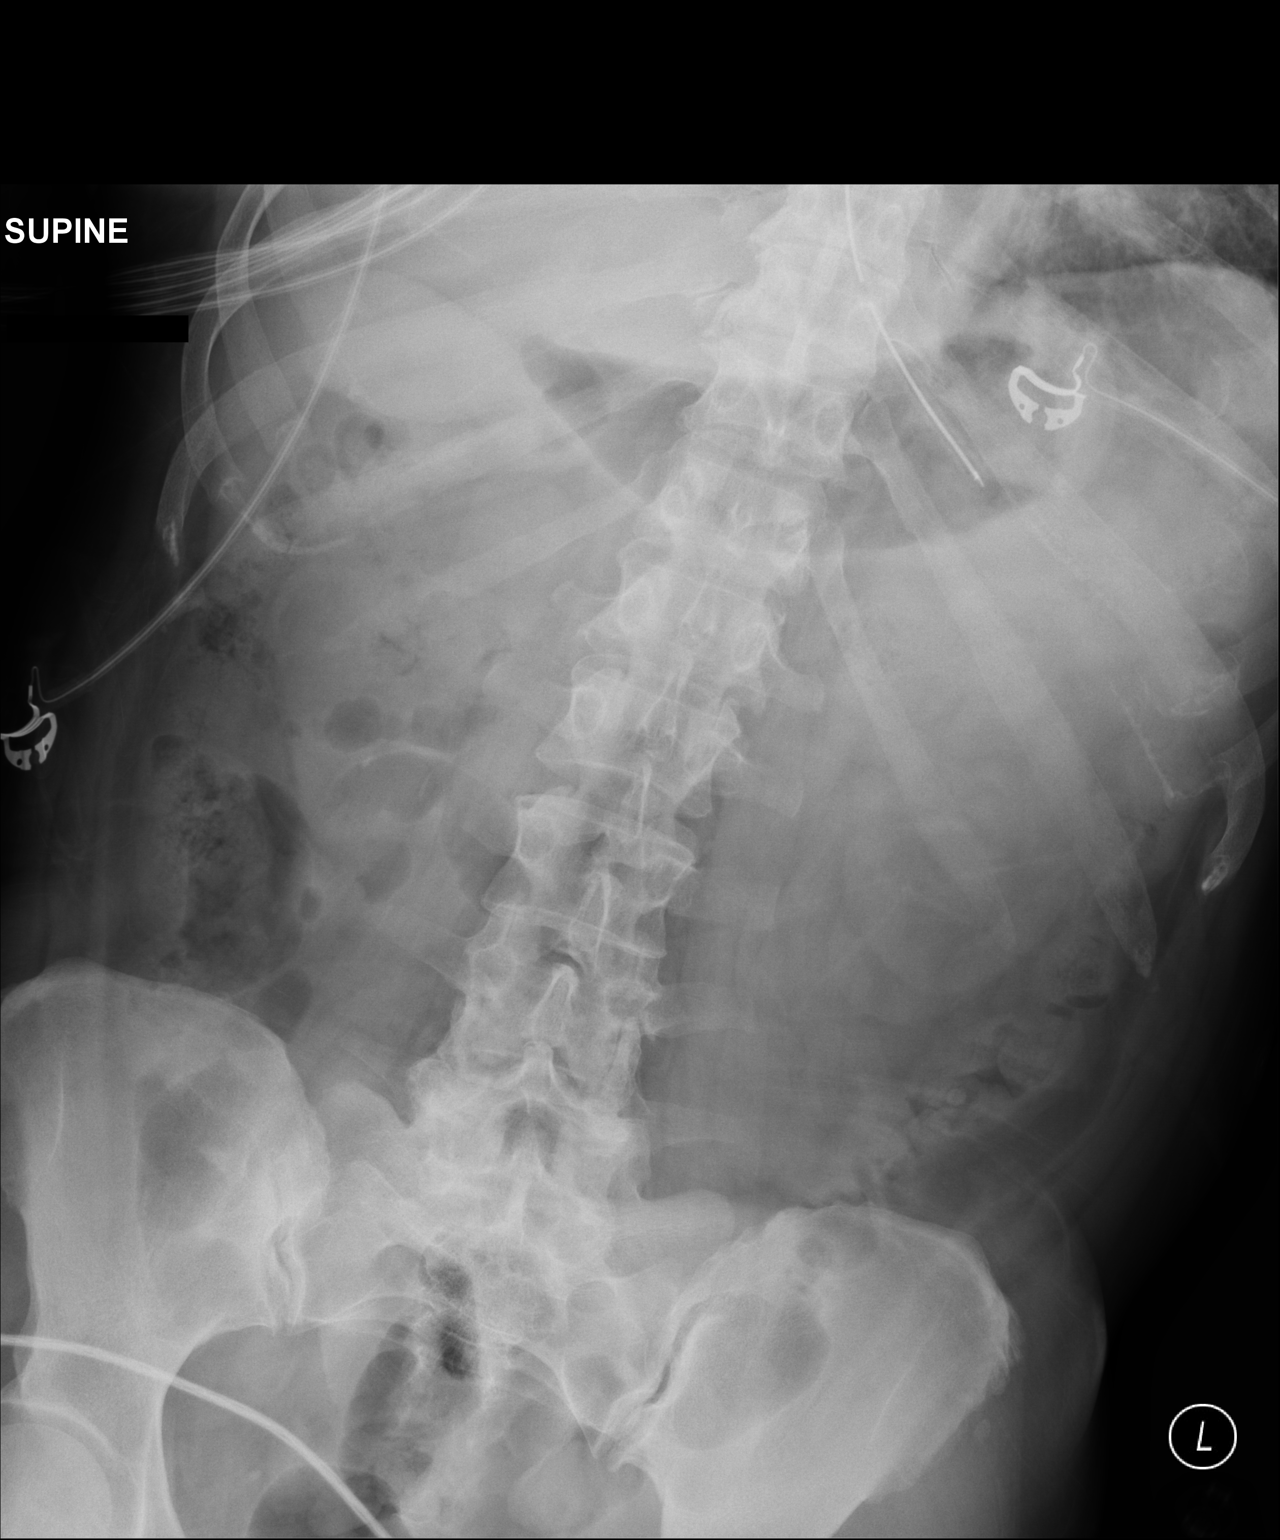

[1 of 1 positions shown; findings below may reference images not displayed]

FINDINGS: The nasogastric tube tip is in the stomach.  The bowel
gas pattern appears normal.  No dilated loops of small bowel or air-
fluid levels.  Gas and stool noted throughout the colon up to the
rectum.
IMPRESSION: 1.  Nonobstructive bowel gas pattern.

## 2013-10-20 IMAGING — CR DG CHEST 1V PORT
1 series · 1 of 1 positions shown · non-contrast
Comparison: 05/10/2012

CLINICAL DATA: Cough, congestion

PORTABLE CHEST - 1 VIEW

[AP]
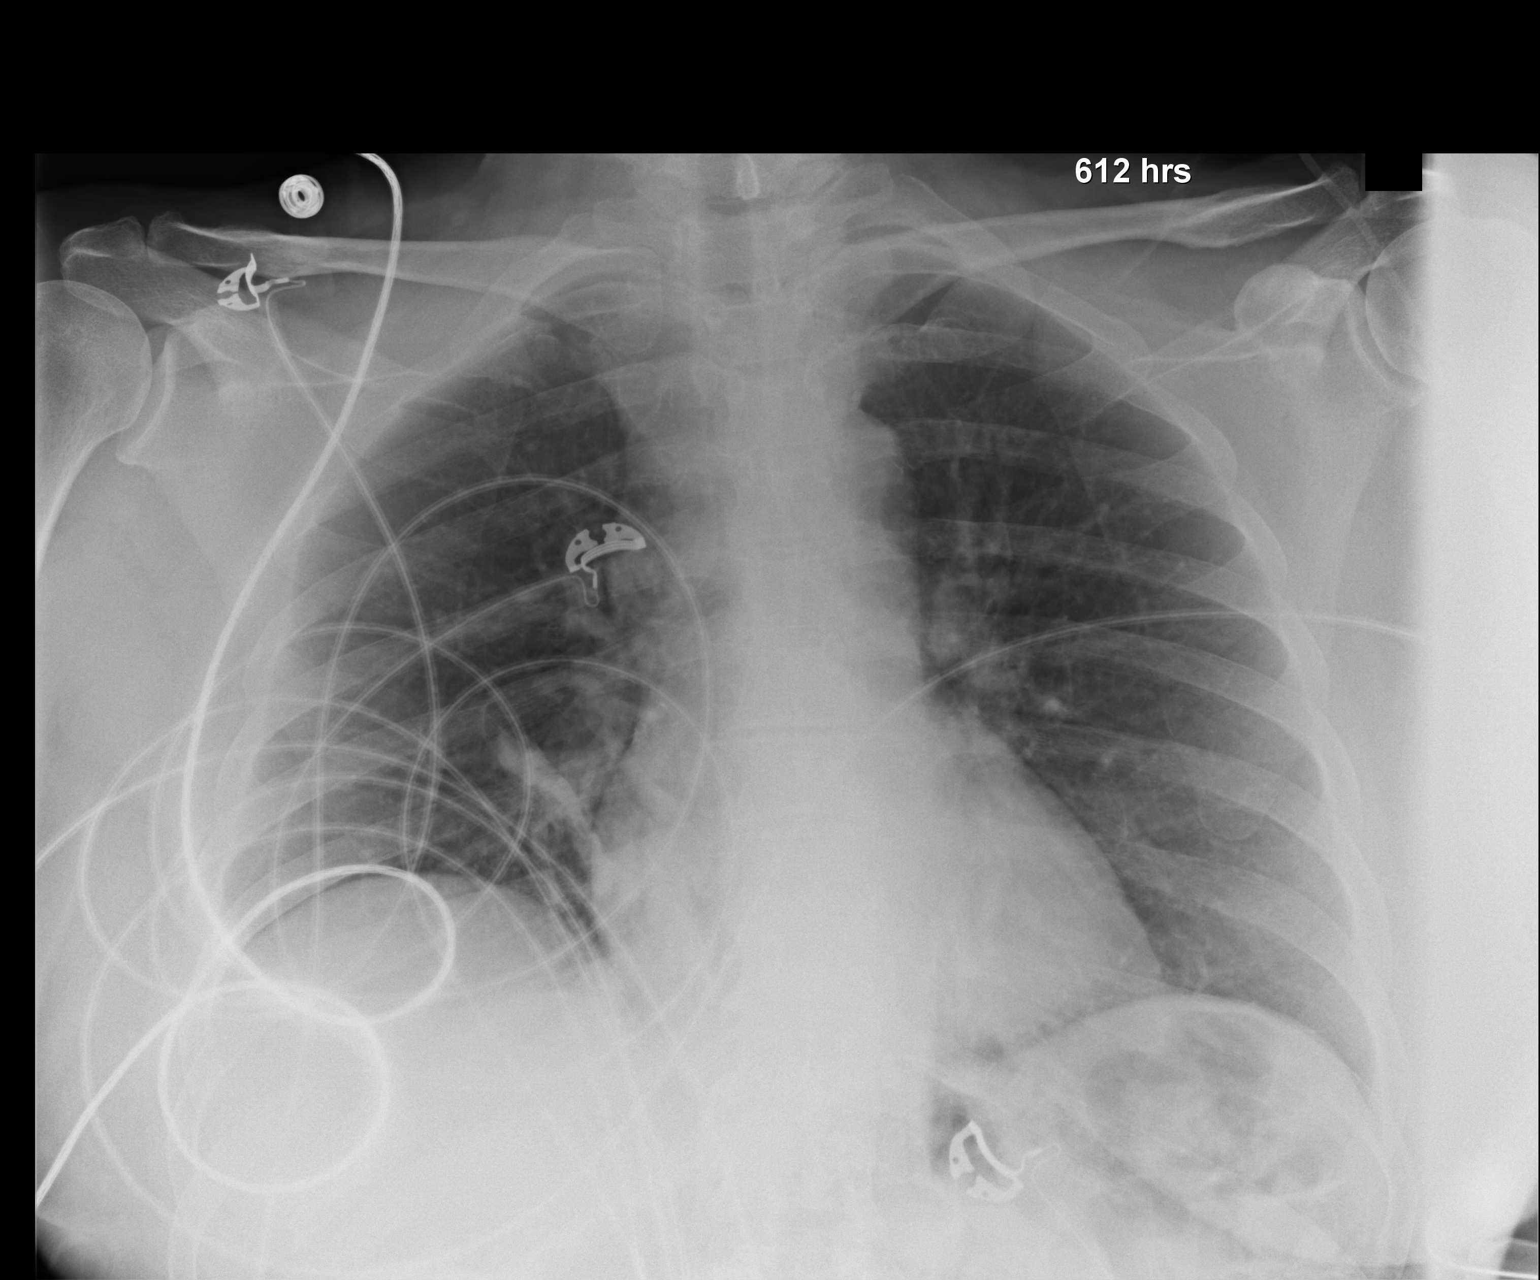

[1 of 1 positions shown; findings below may reference images not displayed]

FINDINGS: The patient has been extubated.  NG tube also removed.
Normal heart size and vascularity.  Persistent medial right lower
lobe atelectasis.  Left lung clear.  No effusion or pneumothorax.
Trachea is midline.
IMPRESSION: Extubated.  Residual medial right base atelectasis.

## 2013-10-25 IMAGING — CT CT HEAD W/O CM
1 of 2 series · 13 of 30 positions shown, 17 images · non-contrast
Comparison: 05/13/2012 and earlier.

CLINICAL DATA: 56-year-old female with subarachnoid hemorrhage.
Ruptured aneurysm status post coiling.

CT HEAD WITHOUT CONTRAST
TECHNIQUE: Contiguous axial images were obtained from the base of
the skull through the vertex without contrast.

[Series 2: brain · axial · 0.49mm/px · z∈[+137,+270]mm · 13 of 32 slices shown, 17 images]
[im 3/32  brain]
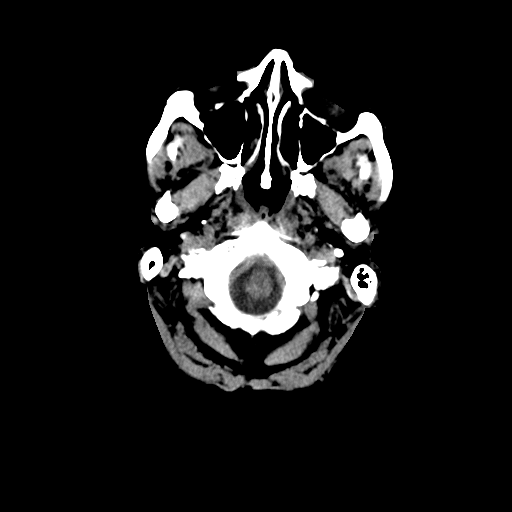
[im 3/32  bone]
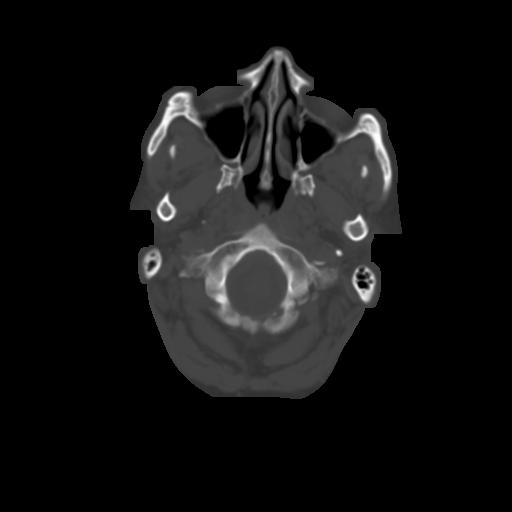
[im 5/32  brain]
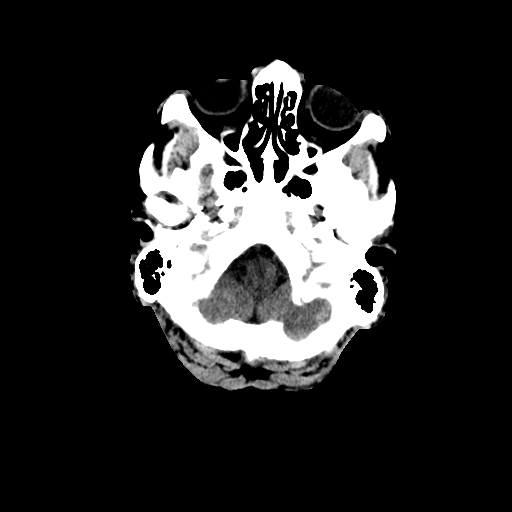
[im 7/32  brain]
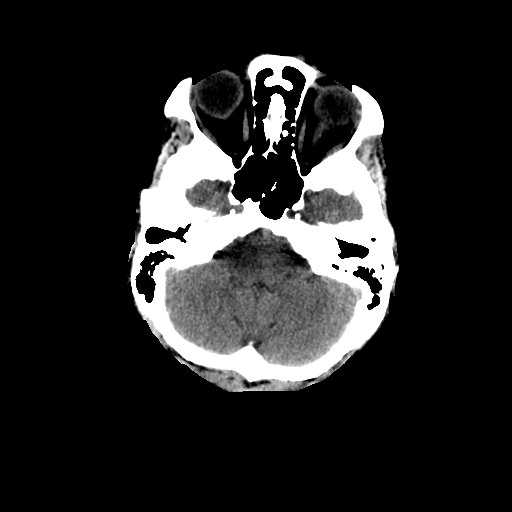
[im 9/32  brain]
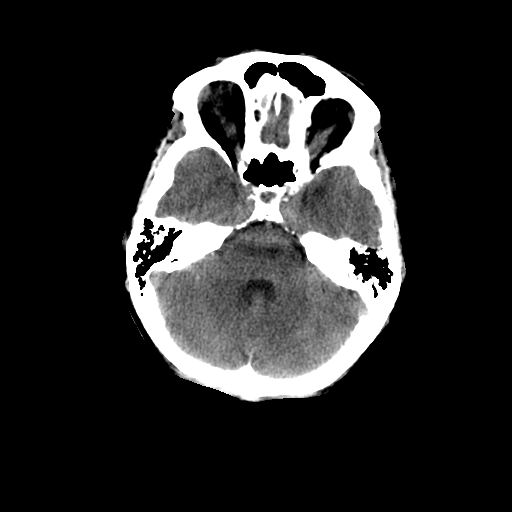
[im 12/32  brain]
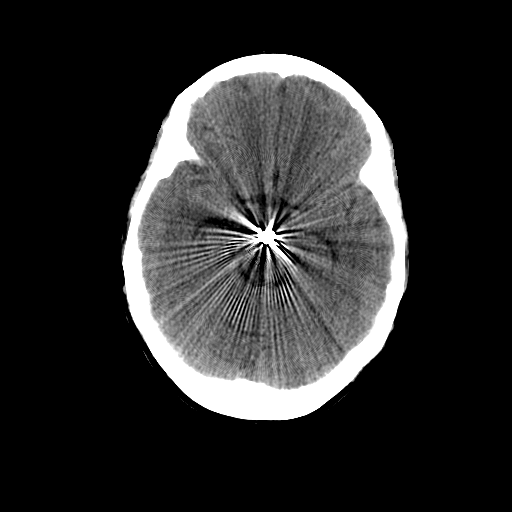
[im 12/32  bone]
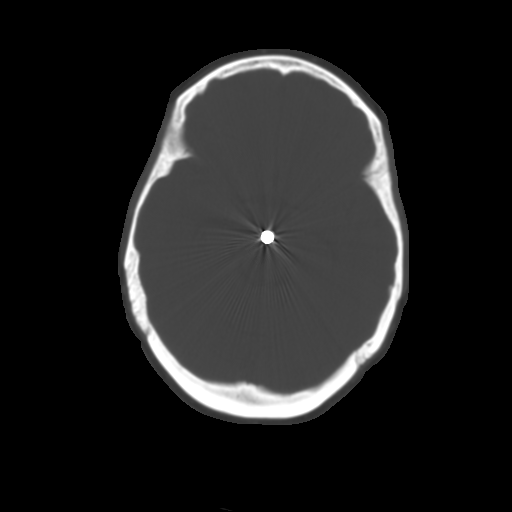
[im 14/32  brain]
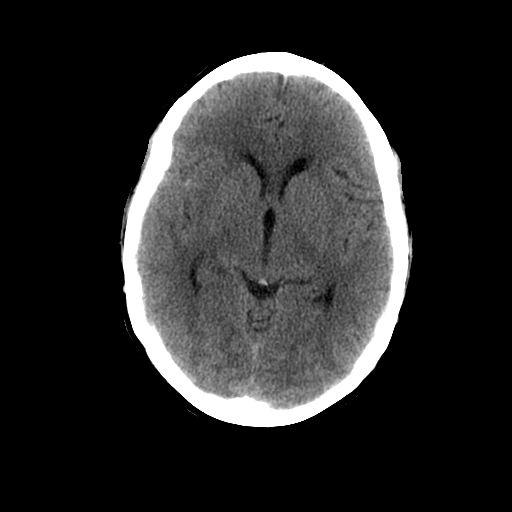
[im 16/32  brain]
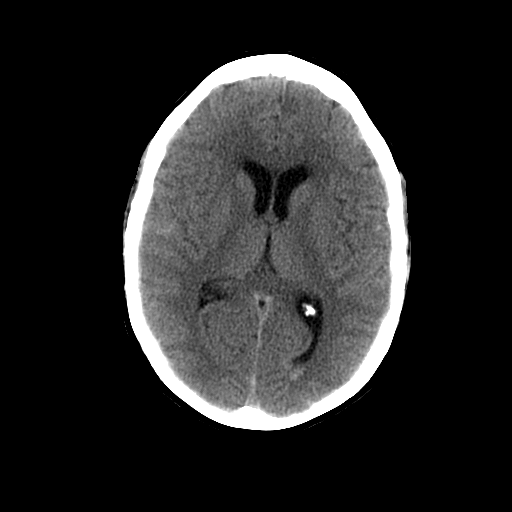
[im 18/32  brain]
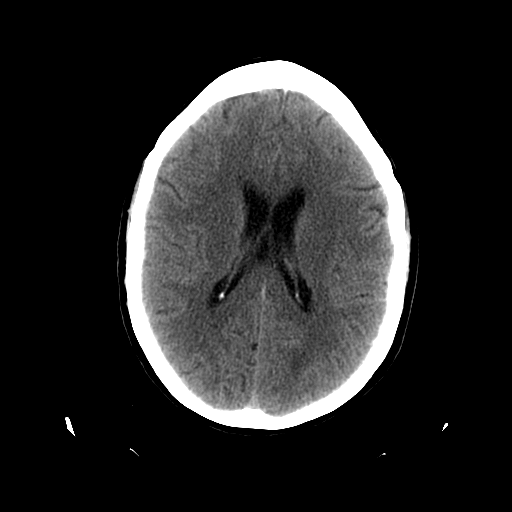
[im 20/32  brain]
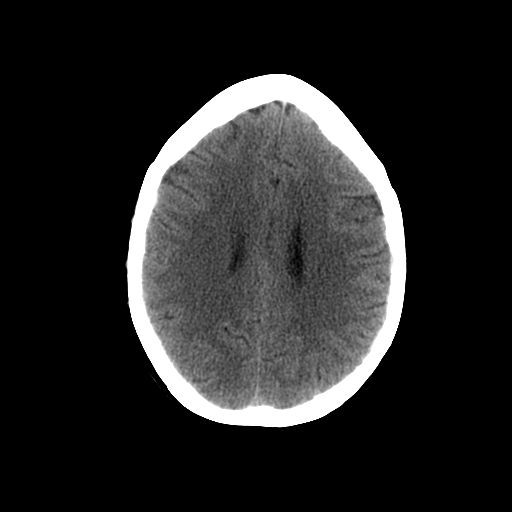
[im 20/32  bone]
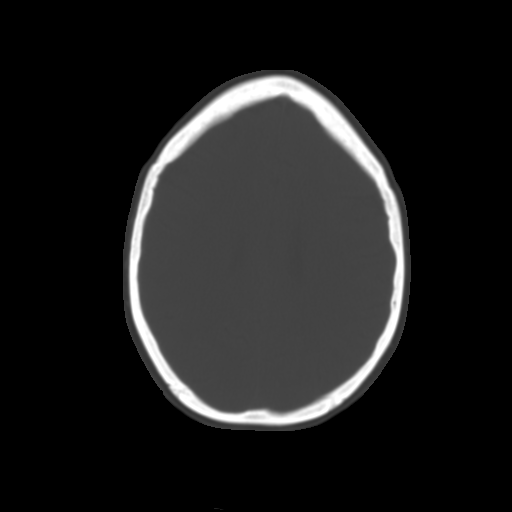
[im 23/32  brain]
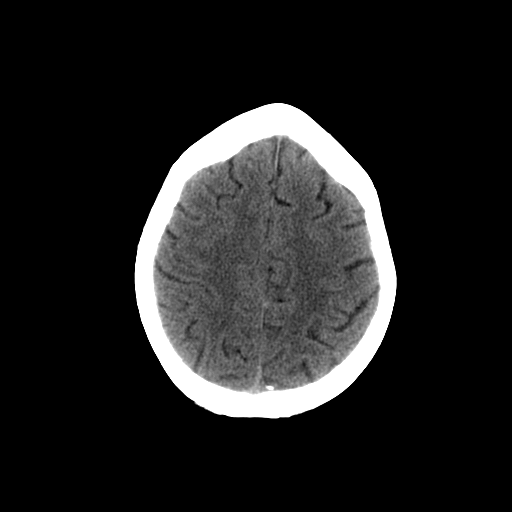
[im 25/32  brain]
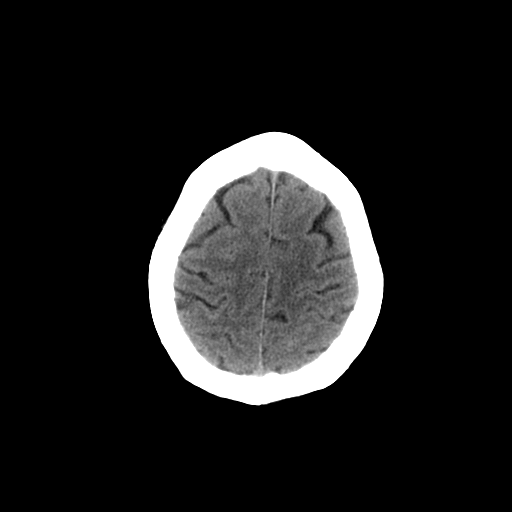
[im 27/32  brain]
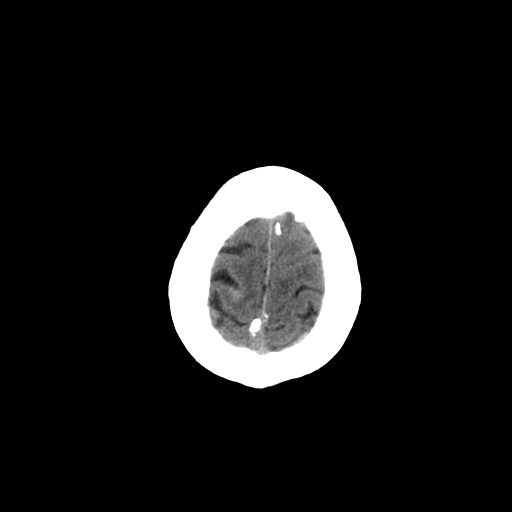
[im 29/32  brain]
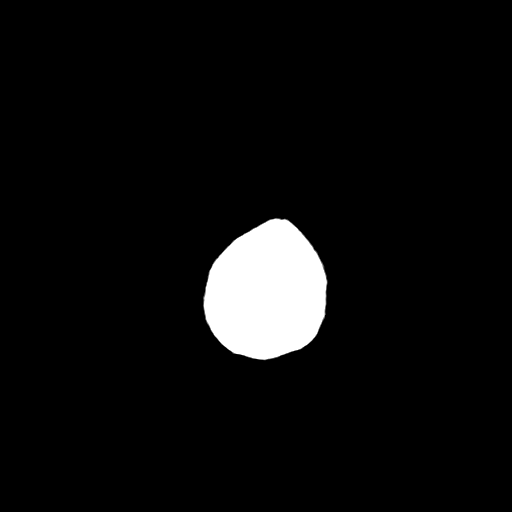
[im 29/32  bone]
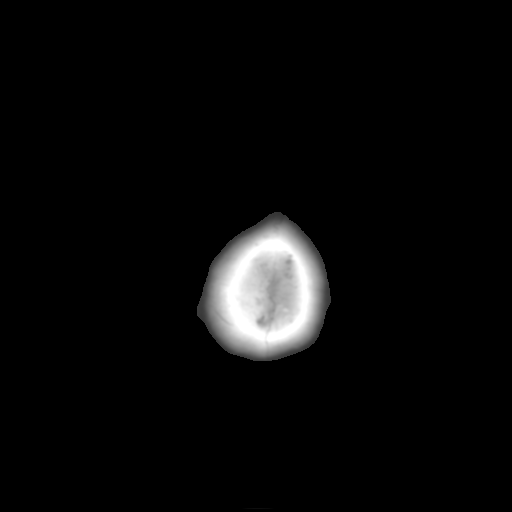

[13 of 30 positions shown; findings below may reference images not displayed]

FINDINGS: Visualized paranasal sinuses and mastoids are clear.
Stable visualized osseous structures.  Stable orbits and scalp soft
tissues.

Stable basilar tip coil pack.  Subsequent streak artifact.  The
small volume of intraventricular hemorrhage appears further
diminished.  Mild if any ventriculomegaly persists.  Sylvian
fissure subarachnoid hemorrhage also has diminished.  Trace
subarachnoid elsewhere is less visible.

No new intracranial hemorrhage.  Serve pass gray-white No evidence
of cortically based acute infarction identified.  No midline shift,
mass effect, or evidence of mass lesion.
IMPRESSION: 1.  Sequelae of basilar tip aneurysm coiling.
2.  Further decreased subarachnoid and intraventricular hemorrhage.
Mild if any ventriculomegaly.  No new intracranial abnormality.

## 2013-11-06 ENCOUNTER — Telehealth (HOSPITAL_COMMUNITY): Payer: Self-pay | Admitting: Interventional Radiology

## 2013-11-06 ENCOUNTER — Other Ambulatory Visit (HOSPITAL_COMMUNITY): Payer: Self-pay | Admitting: Interventional Radiology

## 2013-11-06 DIAGNOSIS — I729 Aneurysm of unspecified site: Secondary | ICD-10-CM

## 2013-11-06 DIAGNOSIS — I609 Nontraumatic subarachnoid hemorrhage, unspecified: Secondary | ICD-10-CM

## 2013-11-06 NOTE — Telephone Encounter (Signed)
Called pt scheduled appt for new sx JMichaux

## 2013-11-11 ENCOUNTER — Ambulatory Visit (HOSPITAL_COMMUNITY)
Admission: RE | Admit: 2013-11-11 | Discharge: 2013-11-11 | Disposition: A | Discharge status: Home / Self Care | Payer: Commercial Managed Care - PPO | Source: Ambulatory Visit | Attending: Interventional Radiology | Admitting: Interventional Radiology

## 2013-11-11 DIAGNOSIS — I609 Nontraumatic subarachnoid hemorrhage, unspecified: Secondary | ICD-10-CM

## 2013-11-11 DIAGNOSIS — I729 Aneurysm of unspecified site: Secondary | ICD-10-CM

## 2013-12-11 ENCOUNTER — Telehealth (HOSPITAL_COMMUNITY): Payer: Self-pay | Admitting: Interventional Radiology

## 2013-12-11 ENCOUNTER — Other Ambulatory Visit (HOSPITAL_COMMUNITY): Payer: Self-pay | Admitting: Interventional Radiology

## 2013-12-11 DIAGNOSIS — H539 Unspecified visual disturbance: Secondary | ICD-10-CM

## 2013-12-11 DIAGNOSIS — I609 Nontraumatic subarachnoid hemorrhage, unspecified: Secondary | ICD-10-CM

## 2013-12-11 DIAGNOSIS — R42 Dizziness and giddiness: Secondary | ICD-10-CM

## 2013-12-11 NOTE — Telephone Encounter (Signed)
Called pt, left VM for her to call to schedule her MRI/MRA brain. JMichaux

## 2014-01-01 ENCOUNTER — Ambulatory Visit (HOSPITAL_COMMUNITY): Payer: Commercial Managed Care - PPO

## 2014-01-06 ENCOUNTER — Ambulatory Visit (HOSPITAL_COMMUNITY): Admission: RE | Admit: 2014-01-06 | Payer: Commercial Managed Care - PPO | Source: Ambulatory Visit

## 2014-01-13 ENCOUNTER — Ambulatory Visit (HOSPITAL_COMMUNITY): Payer: Commercial Managed Care - PPO

## 2014-01-13 ENCOUNTER — Ambulatory Visit (HOSPITAL_COMMUNITY)
Admission: RE | Admit: 2014-01-13 | Discharge: 2014-01-13 | Disposition: A | Discharge status: Home / Self Care | Payer: Commercial Managed Care - PPO | Source: Ambulatory Visit | Attending: Interventional Radiology | Admitting: Interventional Radiology

## 2014-01-13 DIAGNOSIS — I671 Cerebral aneurysm, nonruptured: Secondary | ICD-10-CM | POA: Insufficient documentation

## 2014-01-13 MED ORDER — GADOBENATE DIMEGLUMINE 529 MG/ML IV SOLN
15.0000 mL | Freq: Once | INTRAVENOUS | Status: AC
  Administered 2014-01-13: 15 mL via INTRAVENOUS

## 2014-01-14 ENCOUNTER — Other Ambulatory Visit (HOSPITAL_COMMUNITY): Payer: Self-pay | Admitting: Interventional Radiology

## 2014-01-14 DIAGNOSIS — I609 Nontraumatic subarachnoid hemorrhage, unspecified: Secondary | ICD-10-CM

## 2014-01-14 DIAGNOSIS — I729 Aneurysm of unspecified site: Secondary | ICD-10-CM

## 2014-01-18 ENCOUNTER — Ambulatory Visit (HOSPITAL_COMMUNITY)
Admission: RE | Admit: 2014-01-18 | Discharge: 2014-01-18 | Disposition: A | Discharge status: Home / Self Care | Payer: Commercial Managed Care - PPO | Source: Ambulatory Visit | Attending: Interventional Radiology | Admitting: Interventional Radiology

## 2014-01-18 DIAGNOSIS — I609 Nontraumatic subarachnoid hemorrhage, unspecified: Secondary | ICD-10-CM

## 2014-01-18 DIAGNOSIS — I729 Aneurysm of unspecified site: Secondary | ICD-10-CM

## 2014-01-20 ENCOUNTER — Encounter: Payer: Commercial Managed Care - PPO | Admitting: Internal Medicine

## 2014-01-25 ENCOUNTER — Ambulatory Visit (HOSPITAL_COMMUNITY): Payer: Commercial Managed Care - PPO

## 2014-02-02 ENCOUNTER — Encounter (HOSPITAL_COMMUNITY): Payer: Self-pay | Admitting: Pharmacy Technician

## 2014-02-02 ENCOUNTER — Other Ambulatory Visit (HOSPITAL_COMMUNITY): Payer: Self-pay | Admitting: Interventional Radiology

## 2014-02-02 DIAGNOSIS — I729 Aneurysm of unspecified site: Secondary | ICD-10-CM

## 2014-02-02 DIAGNOSIS — I609 Nontraumatic subarachnoid hemorrhage, unspecified: Secondary | ICD-10-CM

## 2014-02-04 ENCOUNTER — Other Ambulatory Visit: Payer: Self-pay | Admitting: Radiology

## 2014-02-04 NOTE — Pre-Procedure Instructions (Signed)
Shannon Yu  02/04/2014   Your procedure is scheduled on: Wednesday, February 10, 2014  Report to [facility] Admitting at  6:45 AM.  Call this number if you have problems the morning of surgery: [phone]   Remember:   Do not eat food or drink liquids after midnight Tuesday, February 09, 2014   Take these medicines the morning of surgery with A SIP OF WATER: loratadine (CLARITIN), Aspirin, Plavix   STOP Glucosamine, Fish Oil today   STOP/ Do not take Aleve, Naproxen, Advil, Ibuprofen, Motrin, Vitamins, Herbs, or Supplements starting today   Do not wear jewelry, make-up or nail polish.  Do not wear lotions, powders, or perfumes. You may wear deodorant.  Do not shave 48 hours prior to surgery.   Do not bring valuables to the hospital.  [facility] is not responsible for any belongings or valuables.               Contacts, dentures or bridgework may not be worn into surgery.  Leave suitcase in the car. After surgery it may be brought to your room.  For patients admitted to the hospital, discharge time is determined by your treatment team.               Patients discharged the day of surgery will not be allowed to drive home.  Name and phone number of your driver:   Special Instructions: Shower using CHG the night before surgery and the morning of surgery.   Please read over the following fact sheets that you were given: Pain Booklet, Coughing and Deep Breathing and Surgical Site Infection Prevention

## 2014-02-05 ENCOUNTER — Encounter (HOSPITAL_COMMUNITY)
Admission: RE | Admit: 2014-02-05 | Discharge: 2014-02-05 | Disposition: A | Discharge status: Home / Self Care | Payer: Commercial Managed Care - PPO | Source: Ambulatory Visit | Attending: Interventional Radiology | Admitting: Interventional Radiology

## 2014-02-05 ENCOUNTER — Encounter (HOSPITAL_COMMUNITY): Payer: Self-pay

## 2014-02-05 ENCOUNTER — Other Ambulatory Visit: Payer: Self-pay | Admitting: Radiology

## 2014-02-05 DIAGNOSIS — Z01812 Encounter for preprocedural laboratory examination: Secondary | ICD-10-CM | POA: Insufficient documentation

## 2014-02-05 DIAGNOSIS — Z01818 Encounter for other preprocedural examination: Secondary | ICD-10-CM | POA: Diagnosis not present

## 2014-02-05 HISTORY — DX: Dizziness and giddiness: R42

## 2014-02-05 HISTORY — DX: Personal history of other infectious and parasitic diseases: Z86.19

## 2014-02-05 LAB — CBC WITH DIFFERENTIAL/PLATELET
Basophils Absolute: 0 10*3/uL (ref 0.0–0.1)
Basophils Relative: 0 % (ref 0–1)
EOS ABS: 0 10*3/uL (ref 0.0–0.7)
Eosinophils Relative: 1 % (ref 0–5)
HCT: 39 % (ref 36.0–46.0)
Hemoglobin: 13.1 g/dL (ref 12.0–15.0)
Lymphocytes Relative: 43 % (ref 12–46)
Lymphs Abs: 1.9 10*3/uL (ref 0.7–4.0)
MCH: 33.2 pg (ref 26.0–34.0)
MCHC: 33.6 g/dL (ref 30.0–36.0)
MCV: 99 fL (ref 78.0–100.0)
Monocytes Absolute: 0.3 10*3/uL (ref 0.1–1.0)
Monocytes Relative: 7 % (ref 3–12)
NEUTROS PCT: 49 % (ref 43–77)
Neutro Abs: 2.2 10*3/uL (ref 1.7–7.7)
PLATELETS: 206 10*3/uL (ref 150–400)
RBC: 3.94 MIL/uL (ref 3.87–5.11)
RDW: 12.5 % (ref 11.5–15.5)
WBC: 4.5 10*3/uL (ref 4.0–10.5)

## 2014-02-05 LAB — BASIC METABOLIC PANEL
Anion gap: 15 (ref 5–15)
BUN: 12 mg/dL (ref 6–23)
CO2: 22 mEq/L (ref 19–32)
CREATININE: 0.84 mg/dL (ref 0.50–1.10)
Calcium: 9.4 mg/dL (ref 8.4–10.5)
Chloride: 103 mEq/L (ref 96–112)
GFR, EST AFRICAN AMERICAN: 88 mL/min — AB (ref >90)
GFR, EST NON AFRICAN AMERICAN: 76 mL/min — AB (ref >90)
Glucose, Bld: 100 mg/dL — ABNORMAL HIGH (ref 70–99)
Potassium: 4.2 mEq/L (ref 3.7–5.3)
Sodium: 140 mEq/L (ref 137–147)

## 2014-02-05 LAB — PLATELET INHIBITION P2Y12: PLATELET FUNCTION P2Y12: 218 [PRU] (ref 194–418)

## 2014-02-05 LAB — PROTIME-INR
INR: 0.99 (ref 0.00–1.49)
PROTHROMBIN TIME: 13.1 s (ref 11.6–15.2)

## 2014-02-07 ENCOUNTER — Other Ambulatory Visit: Payer: Self-pay | Admitting: Radiology

## 2014-02-09 ENCOUNTER — Other Ambulatory Visit: Payer: Self-pay | Admitting: Radiology

## 2014-02-10 ENCOUNTER — Inpatient Hospital Stay (HOSPITAL_COMMUNITY)
Admission: RE | Admit: 2014-02-10 | Discharge: 2014-02-11 | DRG: 027 | Disposition: A | Discharge status: Home / Self Care | Payer: Commercial Managed Care - PPO | Source: Ambulatory Visit | Attending: Interventional Radiology | Admitting: Interventional Radiology

## 2014-02-10 ENCOUNTER — Encounter (HOSPITAL_COMMUNITY): Payer: Self-pay | Admitting: *Deleted

## 2014-02-10 ENCOUNTER — Encounter (HOSPITAL_COMMUNITY): Payer: Commercial Managed Care - PPO | Admitting: Certified Registered"

## 2014-02-10 ENCOUNTER — Ambulatory Visit (HOSPITAL_COMMUNITY)
Admission: RE | Admit: 2014-02-10 | Discharge: 2014-02-10 | Disposition: A | Discharge status: Home / Self Care | Payer: Commercial Managed Care - PPO | Source: Ambulatory Visit | Attending: Interventional Radiology | Admitting: Interventional Radiology

## 2014-02-10 ENCOUNTER — Ambulatory Visit (HOSPITAL_COMMUNITY): Payer: Commercial Managed Care - PPO | Admitting: Certified Registered"

## 2014-02-10 ENCOUNTER — Encounter (HOSPITAL_COMMUNITY)
Admission: RE | Disposition: A | Discharge status: Home / Self Care | Payer: Self-pay | Source: Ambulatory Visit | Attending: Interventional Radiology

## 2014-02-10 ENCOUNTER — Encounter (HOSPITAL_COMMUNITY): Payer: Self-pay

## 2014-02-10 VITALS — BP 142/83 | HR 73 | Temp 98.0°F | Resp 20 | Ht 68.0 in | Wt 173.0 lb

## 2014-02-10 DIAGNOSIS — I671 Cerebral aneurysm, nonruptured: Secondary | ICD-10-CM | POA: Diagnosis present

## 2014-02-10 DIAGNOSIS — N959 Unspecified menopausal and perimenopausal disorder: Secondary | ICD-10-CM | POA: Diagnosis present

## 2014-02-10 DIAGNOSIS — G43909 Migraine, unspecified, not intractable, without status migrainosus: Secondary | ICD-10-CM | POA: Diagnosis present

## 2014-02-10 DIAGNOSIS — G4730 Sleep apnea, unspecified: Secondary | ICD-10-CM | POA: Diagnosis present

## 2014-02-10 DIAGNOSIS — J301 Allergic rhinitis due to pollen: Secondary | ICD-10-CM | POA: Diagnosis present

## 2014-02-10 DIAGNOSIS — I729 Aneurysm of unspecified site: Secondary | ICD-10-CM

## 2014-02-10 DIAGNOSIS — I609 Nontraumatic subarachnoid hemorrhage, unspecified: Secondary | ICD-10-CM

## 2014-02-10 HISTORY — PX: RADIOLOGY WITH ANESTHESIA: SHX6223

## 2014-02-10 LAB — PLATELET INHIBITION P2Y12: Platelet Function  P2Y12: 117 [PRU] — ABNORMAL LOW (ref 194–418)

## 2014-02-10 LAB — POCT ACTIVATED CLOTTING TIME
ACTIVATED CLOTTING TIME: 157 s
Activated Clotting Time: 180 seconds
Activated Clotting Time: 168 seconds

## 2014-02-10 LAB — HEPARIN LEVEL (UNFRACTIONATED): Heparin Unfractionated: 0.27 IU/mL — ABNORMAL LOW (ref 0.30–0.70)

## 2014-02-10 LAB — MRSA PCR SCREENING: MRSA by PCR: NEGATIVE

## 2014-02-10 SURGERY — RADIOLOGY WITH ANESTHESIA
Anesthesia: General

## 2014-02-10 MED ORDER — SODIUM CHLORIDE 0.9 % IV SOLN
INTRAVENOUS | Status: DC
  Administered 2014-02-10: 14:00:00 via INTRAVENOUS

## 2014-02-10 MED ORDER — ONDANSETRON HCL 4 MG/2ML IJ SOLN
4.0000 mg | Freq: Four times a day (QID) | INTRAMUSCULAR | Status: DC | PRN
  Administered 2014-02-10: 4 mg via INTRAVENOUS
  Filled 2014-02-10: qty 2

## 2014-02-10 MED ORDER — ACETAMINOPHEN 500 MG PO TABS
1000.0000 mg | ORAL_TABLET | Freq: Four times a day (QID) | ORAL | Status: DC | PRN
Start: 2014-02-10 — End: 2014-02-11
  Administered 2014-02-10: 1000 mg via ORAL
  Filled 2014-02-10: qty 2

## 2014-02-10 MED ORDER — EPHEDRINE SULFATE 50 MG/ML IJ SOLN
INTRAMUSCULAR | Status: DC | PRN
  Administered 2014-02-10: 5 mg via INTRAVENOUS

## 2014-02-10 MED ORDER — CHLORHEXIDINE GLUCONATE 0.12 % MT SOLN
15.0000 mL | Freq: Two times a day (BID) | OROMUCOSAL | Status: DC
  Administered 2014-02-10: 15 mL via OROMUCOSAL
  Filled 2014-02-10: qty 15

## 2014-02-10 MED ORDER — PHENYLEPHRINE HCL 10 MG/ML IJ SOLN
INTRAMUSCULAR | Status: DC | PRN
  Administered 2014-02-10 (×2): 80 ug via INTRAVENOUS
  Administered 2014-02-10: 40 ug via INTRAVENOUS

## 2014-02-10 MED ORDER — ROCURONIUM BROMIDE 100 MG/10ML IV SOLN
INTRAVENOUS | Status: DC | PRN
  Administered 2014-02-10: 50 mg via INTRAVENOUS

## 2014-02-10 MED ORDER — HYDROMORPHONE HCL PF 1 MG/ML IJ SOLN: 0.2500 mg | INTRAMUSCULAR | Status: DC | PRN

## 2014-02-10 MED ORDER — HEPARIN (PORCINE) IN NACL 100-0.45 UNIT/ML-% IJ SOLN
700.0000 [IU]/h | INTRAMUSCULAR | Status: AC
  Filled 2014-02-10: qty 250

## 2014-02-10 MED ORDER — HEPARIN (PORCINE) IN NACL 100-0.45 UNIT/ML-% IJ SOLN
INTRAMUSCULAR | Status: AC
  Administered 2014-02-10: 25000 [IU]
  Filled 2014-02-10: qty 250

## 2014-02-10 MED ORDER — PHENYLEPHRINE HCL 10 MG/ML IJ SOLN
10.0000 mg | INTRAVENOUS | Status: DC | PRN
  Administered 2014-02-10: 30 ug/min via INTRAVENOUS

## 2014-02-10 MED ORDER — LACTATED RINGERS IV SOLN
INTRAVENOUS | Status: DC | PRN
  Administered 2014-02-10: 09:00:00 via INTRAVENOUS

## 2014-02-10 MED ORDER — DEXAMETHASONE SODIUM PHOSPHATE 4 MG/ML IJ SOLN
INTRAMUSCULAR | Status: DC | PRN
  Administered 2014-02-10: 4 mg via INTRAVENOUS

## 2014-02-10 MED ORDER — CEFAZOLIN SODIUM-DEXTROSE 2-3 GM-% IV SOLR
2.0000 g | Freq: Once | INTRAVENOUS | Status: AC
Start: 2014-02-10 — End: 2014-02-10
  Administered 2014-02-10: 2 g via INTRAVENOUS
  Filled 2014-02-10: qty 50

## 2014-02-10 MED ORDER — ACETAMINOPHEN 650 MG RE SUPP: 650.0000 mg | Freq: Four times a day (QID) | RECTAL | Status: DC | PRN

## 2014-02-10 MED ORDER — NICARDIPINE HCL IN NACL 20-0.86 MG/200ML-% IV SOLN
INTRAVENOUS | Status: AC
Start: 2014-02-10 — End: 2014-02-11
  Filled 2014-02-10: qty 200

## 2014-02-10 MED ORDER — ASPIRIN EC 325 MG PO TBEC
325.0000 mg | DELAYED_RELEASE_TABLET | ORAL | Status: DC
  Filled 2014-02-10: qty 1

## 2014-02-10 MED ORDER — WHITE PETROLATUM GEL
Status: AC
  Administered 2014-02-10: 16:00:00
  Filled 2014-02-10: qty 5

## 2014-02-10 MED ORDER — CETYLPYRIDINIUM CHLORIDE 0.05 % MT LIQD
7.0000 mL | Freq: Two times a day (BID) | OROMUCOSAL | Status: DC
  Administered 2014-02-10: 7 mL via OROMUCOSAL

## 2014-02-10 MED ORDER — CEFAZOLIN SODIUM-DEXTROSE 2-3 GM-% IV SOLR
INTRAVENOUS | Status: AC
  Filled 2014-02-10: qty 50

## 2014-02-10 MED ORDER — GLYCOPYRROLATE 0.2 MG/ML IJ SOLN
INTRAMUSCULAR | Status: DC | PRN
  Administered 2014-02-10: .6 mg via INTRAVENOUS

## 2014-02-10 MED ORDER — HEPARIN SODIUM (PORCINE) 1000 UNIT/ML IJ SOLN
INTRAMUSCULAR | Status: DC | PRN
  Administered 2014-02-10: 500 [IU] via INTRAVENOUS
  Administered 2014-02-10: 3000 [IU] via INTRAVENOUS
  Administered 2014-02-10 (×2): 500 [IU] via INTRAVENOUS

## 2014-02-10 MED ORDER — NIMODIPINE 30 MG PO CAPS
60.0000 mg | ORAL_CAPSULE | ORAL | Status: AC
  Administered 2014-02-10: 60 mg via ORAL
  Filled 2014-02-10 (×2): qty 2

## 2014-02-10 MED ORDER — NICARDIPINE HCL IN NACL 20-0.86 MG/200ML-% IV SOLN
5.0000 mg/h | INTRAVENOUS | Status: DC
  Administered 2014-02-10: 10 mg/h via INTRAVENOUS
  Administered 2014-02-10: 5 mg/h via INTRAVENOUS
  Administered 2014-02-10: 10 mg/h via INTRAVENOUS
  Administered 2014-02-10: 5 mg/h via INTRAVENOUS
  Filled 2014-02-10 (×3): qty 200

## 2014-02-10 MED ORDER — IOHEXOL 300 MG/ML  SOLN
150.0000 mL | Freq: Once | INTRAMUSCULAR | Status: AC | PRN
  Administered 2014-02-10: 72 mL via INTRAVENOUS

## 2014-02-10 MED ORDER — SODIUM CHLORIDE 0.9 % IV SOLN: INTRAVENOUS | Status: DC

## 2014-02-10 MED ORDER — NITROGLYCERIN 1 MG/10 ML FOR IR/CATH LAB
INTRA_ARTERIAL | Status: AC
  Filled 2014-02-10: qty 10

## 2014-02-10 MED ORDER — NEOSTIGMINE METHYLSULFATE 10 MG/10ML IV SOLN
INTRAVENOUS | Status: DC | PRN
  Administered 2014-02-10: 4 mg via INTRAVENOUS

## 2014-02-10 MED ORDER — LIDOCAINE HCL 4 % MT SOLN
OROMUCOSAL | Status: DC | PRN
  Administered 2014-02-10: 4 mL via TOPICAL

## 2014-02-10 MED ORDER — LIDOCAINE HCL (CARDIAC) 20 MG/ML IV SOLN
INTRAVENOUS | Status: DC | PRN
  Administered 2014-02-10: 100 mg via INTRAVENOUS

## 2014-02-10 MED ORDER — ONDANSETRON HCL 4 MG/2ML IJ SOLN
INTRAMUSCULAR | Status: DC | PRN
  Administered 2014-02-10: 4 mg via INTRAVENOUS

## 2014-02-10 MED ORDER — CLOPIDOGREL BISULFATE 75 MG PO TABS
75.0000 mg | ORAL_TABLET | ORAL | Status: DC
  Filled 2014-02-10: qty 1

## 2014-02-10 MED ORDER — CLOPIDOGREL BISULFATE 75 MG PO TABS
75.0000 mg | ORAL_TABLET | Freq: Every day | ORAL | Status: DC
  Administered 2014-02-11: 75 mg via ORAL
  Filled 2014-02-10 (×2): qty 1

## 2014-02-10 MED ORDER — ASPIRIN 325 MG PO TABS
325.0000 mg | ORAL_TABLET | Freq: Every day | ORAL | Status: DC
  Administered 2014-02-11: 325 mg via ORAL
  Filled 2014-02-10 (×2): qty 1

## 2014-02-10 MED ORDER — PROPOFOL 10 MG/ML IV BOLUS
INTRAVENOUS | Status: DC | PRN
  Administered 2014-02-10: 200 mg via INTRAVENOUS

## 2014-02-10 MED ORDER — VECURONIUM BROMIDE 10 MG IV SOLR
INTRAVENOUS | Status: DC | PRN
  Administered 2014-02-10 (×2): 2 mg via INTRAVENOUS

## 2014-02-10 MED ORDER — LACTATED RINGERS IV SOLN
INTRAVENOUS | Status: DC | PRN
  Administered 2014-02-10 (×2): via INTRAVENOUS

## 2014-02-10 MED ORDER — FENTANYL CITRATE 0.05 MG/ML IJ SOLN
INTRAMUSCULAR | Status: DC | PRN
  Administered 2014-02-10 (×3): 50 ug via INTRAVENOUS
  Administered 2014-02-10: 100 ug via INTRAVENOUS

## 2014-02-10 MED ORDER — ARTIFICIAL TEARS OP OINT
TOPICAL_OINTMENT | OPHTHALMIC | Status: DC | PRN
  Administered 2014-02-10: 1 via OPHTHALMIC

## 2014-02-10 NOTE — Transfer of Care (Signed)
Immediate Anesthesia Transfer of Care Note  Patient: Shannon Yu  Procedure(s) Performed: Procedure(s): RADIOLOGY WITH ANESTHESIA, emobolization (N/A)  Patient Location: PACU  Anesthesia Type:General  Level of Consciousness: awake, alert  and oriented  Airway & Oxygen Therapy: Patient Spontanous Breathing and Patient connected to nasal cannula oxygen  Post-op Assessment: Report given to PACU RN and Post -op Vital signs reviewed and stable  Post vital signs: Reviewed and stable  Complications: No apparent anesthesia complications

## 2014-02-10 NOTE — Anesthesia Preprocedure Evaluation (Addendum)
Anesthesia Evaluation  Patient identified by MRN, date of birth, ID band Patient awake    Reviewed: Allergy & Precautions, H&P , NPO status , Patient's Chart, lab work & pertinent test results  History of Anesthesia Complications (+) PONV and history of anesthetic complications  Airway Mallampati: II TM Distance: >3 FB Neck ROM: Full    Dental  (+) Teeth Intact, Dental Advisory Given   Pulmonary sleep apnea , former smoker,  breath sounds clear to auscultation        Cardiovascular + Peripheral Vascular Disease Rhythm:Regular Rate:Normal     Neuro/Psych  Headaches, Neuro history noted. CE  Neuromuscular disease    GI/Hepatic negative GI ROS, Neg liver ROS,   Endo/Other  negative endocrine ROS  Renal/GU negative Renal ROS     Musculoskeletal   Abdominal   Peds  Hematology   Anesthesia Other Findings Brain Aneurysm  Reproductive/Obstetrics                         Anesthesia Physical Anesthesia Plan  ASA: III  Anesthesia Plan: General   Post-op Pain Management:    Induction: Intravenous  Airway Management Planned: Oral ETT  Additional Equipment:   Intra-op Plan:   Post-operative Plan: Possible Post-op intubation/ventilation  Informed Consent: I have reviewed the patients History and Physical, chart, labs and discussed the procedure including the risks, benefits and alternatives for the proposed anesthesia with the patient or authorized representative who has indicated his/her understanding and acceptance.   Dental advisory given  Plan Discussed with: CRNA, Anesthesiologist and Surgeon  Anesthesia Plan Comments:        Anesthesia Quick Evaluation

## 2014-02-10 NOTE — Progress Notes (Signed)
Subjective: Patient s/p LVIS Jr. 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 She is extubated and in neuro ICU. She c/o bilateral frontal HA 5/10 without visual changes or auditory changes. She denies right groin site pain. She denies any active N/V.   Objective: Physical Exam: BP 105/54  Pulse 80  Temp(Src) 97.7 F (36.5 C) (Oral)  Resp 14  Ht 5\' 8"  (1.727 m)  Wt 175 lb 0.7 oz (79.4 kg)  BMI 26.62 kg/m2  SpO2 97%  General: A&Ox3, NAD, lying in bed, lip swollen and ecchymotic.  Heart: RRR without M/G/R Lungs: CTA bilaterally.  Abd: Soft, NT, ND Ext: RCFA access site dressing C/D/I, no bleeding or hematoma, DP intact bilaterally.  Neuro: Speech clear, tongue midline, smile symmetrical, equal strength upper and lower extremities 5/5, fine motor intact, EOMI, no ataxia  Labs: CBC No results found for this basename: WBC, HGB, HCT, PLT,  in the last 72 hours BMET No results found for this basename: NA, K, CL, CO2, GLUCOSE, BUN, CREATININE, CALCIUM,  in the last 72 hours LFT No results found for this basename: PROT, ALBUMIN, AST, ALT, ALKPHOS, BILITOT, BILIDIR, IBILI, LIPASE,  in the last 72 hours PT/INR No results found for this basename: LABPROT, INR,  in the last 72 hours   Studies/Results: No results found.  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 Continue heparin until 0700, continue aspirin and plavix. Slowly advance diet starting with liquids only tonight, encourage ice chips for lip swelling.  D/c in am if stable.     LOS: 0 days    Rockney Ghee 02/10/2014 3:09 PM

## 2014-02-10 NOTE — Progress Notes (Signed)
Neuro assessment unchanged from previous assessment. Peripheral pulses unchanged also.

## 2014-02-10 NOTE — Progress Notes (Signed)
ANTICOAGULATION CONSULT NOTE - Initial Consult  Pharmacy Consult for heparin Indication: post embolization of aneurysm  No Known Allergies  Patient Measurements: Height: 5\' 8"  (172.7 cm) Weight: 175 lb 0.7 oz (79.4 kg) IBW/kg (Calculated) : 63.9  Vital Signs: Temp: 97.7 F (36.5 C) (08/05 1330) Temp src: Oral (08/05 1330) BP: 110/63 mmHg (08/05 1330) Pulse Rate: 73 (08/05 1330)  Labs: No results found for this basename: HGB, HCT, PLT, APTT, LABPROT, INR, HEPARINUNFRC, CREATININE, CKTOTAL, CKMB, TROPONINI,  in the last 72 hours  Estimated Creatinine Clearance: 82.8 ml/min (by C-G formula based on Cr of 0.84).   Medical History: Past Medical History  Diagnosis Date  . Menopause   . Environmental allergies   . Migraine   . Sleep apnea 06/2011 dx    CPAP 13/apria  . Osteoarthritis     left knee  . Brain aneurysm 05/09/12    coil/stent  . PONV (postoperative nausea and vomiting)   . Vertigo   . History of shingles     Medications:  Infusions:  . sodium chloride    . heparin    . niCARDipine      Assessment: 58 y/o female to begin IV heparin s/p embolization of basilar apex aneurysm and stent placement. No bleeding noted, baseline CBC is normal.  Goal of Therapy:  Heparin level 0.1-0.25 units/ml Monitor platelets by anticoagulation protocol: Yes   Plan:  - Increase heparin to 750 units/hr with no bolus - 6 hr heparin level - CBC in am  [facility], [place].D., BCPS Clinical Pharmacist Pager: [phone] 02/10/2014 1:51 PM

## 2014-02-10 NOTE — Progress Notes (Signed)
AA&Ox3, moves all extremitiesx4, perla, equal hand grasp and pulses x3 bi lat lower extremities

## 2014-02-10 NOTE — Anesthesia Procedure Notes (Signed)
Procedure Name: Intubation Date/Time: 02/10/2014 9:42 AM Performed by: Storm Frisk E Pre-anesthesia Checklist: Patient identified, Patient being monitored, Timeout performed, Emergency Drugs available and Suction available Patient Re-evaluated:Patient Re-evaluated prior to inductionOxygen Delivery Method: Circle system utilized Preoxygenation: Pre-oxygenation with 100% oxygen Intubation Type: IV induction and Cricoid Pressure applied Ventilation: Mask ventilation without difficulty and Oral airway inserted - appropriate to patient size Laryngoscope Size: Mac and 3 Grade View: Grade II Tube type: Oral Tube size: 7.5 mm Number of attempts: 2 Airway Equipment and Method: Stylet,  Oral airway and LTA kit utilized Placement Confirmation: ETT inserted through vocal cords under direct vision,  breath sounds checked- equal and bilateral and positive ETCO2 Secured at: 21 cm Tube secured with: Tape Dental Injury: Injury to lip and Teeth and Oropharynx as per pre-operative assessment  Comments: Small laceration to left upper lip; lubricant applied.

## 2014-02-10 NOTE — Procedures (Signed)
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

## 2014-02-10 NOTE — Progress Notes (Signed)
ANTICOAGULATION CONSULT NOTE - Follow-up Consult  Pharmacy Consult for heparin Indication: post embolization of aneurysm  No Known Allergies  Patient Measurements: Height: 5\' 8"  (172.7 cm) Weight: 175 lb 0.7 oz (79.4 kg) IBW/kg (Calculated) : 63.9  Vital Signs: Temp: 97.9 F (36.6 C) (08/05 1940) Temp src: Oral (08/05 1940) BP: 104/56 mmHg (08/05 2100) Pulse Rate: 97 (08/05 2100)  Labs:  Recent Labs  02/10/14 2030  HEPARINUNFRC 0.27*    Estimated Creatinine Clearance: 82.8 ml/min (by C-G formula based on Cr of 0.84).  Assessment: 58 y/o female to begin IV heparin s/p embolization of basilar apex aneurysm and stent placement. Heparin level 0.27 - slightly supratherapeutic. No bleeding noted.  Goal of Therapy:  Heparin level 0.1-0.25 units/ml Monitor platelets by anticoagulation protocol: Yes   Plan:  - Decrease heparin to 700 units/hr - No repeat heparin level as off at 0700 - CBC in am  Sherlon Handing, PharmD, BCPS Clinical pharmacist, pager [phone] 02/10/2014 9:39 PM

## 2014-02-10 NOTE — H&P (Signed)
Chief Complaint: "Here for my brain aneurysm treatment." HPI: Shannon Yu is an 58 y.o. female found to have neck remnant of basilar apex aneurysm s/p endovascular embolization on 05/09/2012 followed by additional treatment on 09/03/2012. The patient is neurologically asymptomatic. She was seen in consult on 01/18/14 and scheduled today for basilar aneurysm embolization with coils and possible stent placement with general anesthesia. She denies any chest pain, shortness of breath or palpitations. She denies any active signs of bleeding or excessive bruising. She denies any recent fever or chills. She has previously tolerated anesthesia without complications.   Past Medical History:  Past Medical History  Diagnosis Date  . Menopause   . Environmental allergies   . Migraine   . Sleep apnea 06/2011 dx    CPAP 13/apria  . Osteoarthritis     left knee  . Brain aneurysm 05/09/12    coil/stent  . PONV (postoperative nausea and vomiting)   . Vertigo   . History of shingles     Past Surgical History:  Past Surgical History  Procedure Laterality Date  . Knee arthroscopy      Left  . Aneurysm coiling  05/09/2012    middle  . Radiology with anesthesia N/A 09/03/2012    Procedure: RADIOLOGY WITH ANESTHESIA;  Surgeon: Rob Hickman, MD;  Location: [place];  Service: Radiology;  Laterality: N/A;  aneurysm embolization   . Breast surgery  1988    BREAST BIOPSY-BENIGN  . Breast surgery  1993    BREAST BIOPSY--BENIGN    Family History:  Family History  Problem Relation Age of Onset  . Emphysema Father     smoker  . Lung cancer Father 19  . Prostate cancer Father   . Kidney disease Father     CANCER  . Scleroderma Mother 39    Social History:  reports that she has quit smoking. Her smoking use included Cigarettes. She has a 2.5 pack-year smoking history. She has never used smokeless tobacco. She reports that she drinks alcohol. She reports that she does not use illicit  drugs.  Allergies: No Known Allergies  Medications:   Medication List    Notice   This visit is during an admission. Changes to the med list made in this visit will be reflected in the After Visit Summary of the admission.     Please HPI for pertinent positives, otherwise complete 10 system ROS negative.  Physical Exam: BP 142/83  Pulse 73  Temp(Src) 98 F (36.7 C) (Oral)  Resp 20  Ht _0  (1.727 m)  Wt 173 lb (78.472 kg)  BMI 26.31 kg/m2  SpO2 100% Body mass index is 26.31 kg/(m^2).  General Appearance:  Alert, cooperative, no distress  Head:  Normocephalic, without obvious abnormality, atraumatic  Neck: Supple, symmetrical, trachea midline  Lungs:   Clear to auscultation bilaterally, no w/r/r, respirations unlabored without use of accessory muscles.  Chest Wall:  No tenderness or deformity  Heart:  Regular rate and rhythm, S1, S2 normal, no murmur, rub or gallop.  Abdomen:   Soft, non-tender, non distended, (+) BS  Extremities: Extremities normal, atraumatic, no cyanosis or edema  Pulses: 1+ and symmetric  Neurologic: Normal affect, no gross deficits, equal strength bilaterally upper and lower ext, speech clear, smile symmetrical.    No results found for this or any previous visit (from the past 48 hour(s)). Recent Results (from the past 2160 hour(s))  BASIC METABOLIC PANEL     Status: Abnormal   Collection Time  02/05/14  8:45 AM      Result Value Ref Range   Sodium 140  137 - 147 mEq/L   Potassium 4.2  3.7 - 5.3 mEq/L   Chloride 103  96 - 112 mEq/L   CO2 22  19 - 32 mEq/L   Glucose, Bld 100 (*) 70 - 99 mg/dL   BUN 12  6 - 23 mg/dL   Creatinine, Ser 0.84  0.50 - 1.10 mg/dL   Calcium 9.4  8.4 - 10.5 mg/dL   GFR calc non Af Amer 76 (*) >90 mL/min   GFR calc Af Amer 88 (*) >90 mL/min   Comment: (NOTE)     The eGFR has been calculated using the CKD EPI equation.     This calculation has not been validated in all clinical situations.     eGFR's persistently <90  mL/min signify possible Chronic Kidney     Disease.   Anion gap 15  5 - 15  CBC WITH DIFFERENTIAL     Status: None   Collection Time    02/05/14  8:45 AM      Result Value Ref Range   WBC 4.5  4.0 - 10.5 K/uL   RBC 3.94  3.87 - 5.11 MIL/uL   Hemoglobin 13.1  12.0 - 15.0 g/dL   HCT 39.0  36.0 - 46.0 %   MCV 99.0  78.0 - 100.0 fL   MCH 33.2  26.0 - 34.0 pg   MCHC 33.6  30.0 - 36.0 g/dL   RDW 12.5  11.5 - 15.5 %   Platelets 206  150 - 400 K/uL   Neutrophils Relative % 49  43 - 77 %   Neutro Abs 2.2  1.7 - 7.7 K/uL   Lymphocytes Relative 43  12 - 46 %   Lymphs Abs 1.9  0.7 - 4.0 K/uL   Monocytes Relative 7  3 - 12 %   Monocytes Absolute 0.3  0.1 - 1.0 K/uL   Eosinophils Relative 1  0 - 5 %   Eosinophils Absolute 0.0  0.0 - 0.7 K/uL   Basophils Relative 0  0 - 1 %   Basophils Absolute 0.0  0.0 - 0.1 K/uL  PLATELET INHIBITION P2Y12     Status: None   Collection Time    02/05/14  8:45 AM      Result Value Ref Range   Platelet Function  P2Y12 218  194 - 418 PRU   Comment:            The literature has shown a direct     correlation of PRU values over     230 with higher risks of     thrombotic events.  Lower PRU     values are associated with     platelet inhibition.  PROTIME-INR     Status: None   Collection Time    02/05/14  8:45 AM      Result Value Ref Range   Prothrombin Time 13.1  11.6 - 15.2 seconds   INR 0.99  0.00 - 1.49  PLATELET INHIBITION P2Y12     Status: Abnormal   Collection Time    02/10/14  7:30 AM      Result Value Ref Range   Platelet Function  P2Y12 117 (*) 194 - 418 PRU   Comment:            The literature has shown a direct     correlation of PRU values over  230 with higher risks of     thrombotic events.  Lower PRU     values are associated with     platelet inhibition.   Assessment/Plan Neck remnant of basilar apex aneurysm s/p endovascular embolization on 05/09/2012 followed by additional treatment on 09/03/2012.  Seen in consult on  01/18/14 Scheduled today for basilar aneurysm embolization with coils and possible stent placement with general anesthesia. Patient is asymptomatic, has been NPO, on aspirin and plavix p2y12 is 117, all other labs reviewed, afebrile. Risks and Benefits discussed with the patient. All of the patient's questions were answered, patient is agreeable to proceed. Consent signed and in chart.   Tsosie Billing D PA-C 02/10/2014, 9:03 AM

## 2014-02-10 NOTE — Anesthesia Postprocedure Evaluation (Signed)
  Anesthesia Post-op Note  Patient: Shannon Yu  Procedure(s) Performed: Procedure(s): RADIOLOGY WITH ANESTHESIA, emobolization (N/A)  Patient Location: PACU  Anesthesia Type:General  Level of Consciousness: awake  Airway and Oxygen Therapy: Patient Spontanous Breathing  Post-op Pain: mild  Post-op Assessment: Post-op Vital signs reviewed  Post-op Vital Signs: Reviewed  Last Vitals:  Filed Vitals:   02/10/14 1330  BP: 110/63  Pulse: 73  Temp: 36.5 C  Resp: 11    Complications: No apparent anesthesia complications

## 2014-02-11 ENCOUNTER — Encounter (HOSPITAL_COMMUNITY): Payer: Self-pay | Admitting: Interventional Radiology

## 2014-02-11 LAB — CBC WITH DIFFERENTIAL/PLATELET
Basophils Absolute: 0 10*3/uL (ref 0.0–0.1)
Basophils Relative: 0 % (ref 0–1)
EOS ABS: 0 10*3/uL (ref 0.0–0.7)
EOS PCT: 0 % (ref 0–5)
HCT: 35.4 % — ABNORMAL LOW (ref 36.0–46.0)
Hemoglobin: 11.9 g/dL — ABNORMAL LOW (ref 12.0–15.0)
LYMPHS ABS: 1.7 10*3/uL (ref 0.7–4.0)
Lymphocytes Relative: 22 % (ref 12–46)
MCH: 33 pg (ref 26.0–34.0)
MCHC: 33.6 g/dL (ref 30.0–36.0)
MCV: 98.1 fL (ref 78.0–100.0)
Monocytes Absolute: 0.3 10*3/uL (ref 0.1–1.0)
Monocytes Relative: 4 % (ref 3–12)
Neutro Abs: 5.7 10*3/uL (ref 1.7–7.7)
Neutrophils Relative %: 74 % (ref 43–77)
PLATELETS: 189 10*3/uL (ref 150–400)
RBC: 3.61 MIL/uL — AB (ref 3.87–5.11)
RDW: 12.3 % (ref 11.5–15.5)
WBC: 7.7 10*3/uL (ref 4.0–10.5)

## 2014-02-11 LAB — PLATELET INHIBITION P2Y12: PLATELET FUNCTION P2Y12: 171 [PRU] — AB (ref 194–418)

## 2014-02-11 LAB — BASIC METABOLIC PANEL
Anion gap: 10 (ref 5–15)
BUN: 7 mg/dL (ref 6–23)
CALCIUM: 8.1 mg/dL — AB (ref 8.4–10.5)
CO2: 21 mEq/L (ref 19–32)
Chloride: 109 mEq/L (ref 96–112)
Creatinine, Ser: 0.66 mg/dL (ref 0.50–1.10)
GFR calc Af Amer: >90 mL/min (ref >90)
GLUCOSE: 111 mg/dL — AB (ref 70–99)
Potassium: 3.8 mEq/L (ref 3.7–5.3)
Sodium: 140 mEq/L (ref 137–147)

## 2014-02-11 NOTE — Discharge Summary (Signed)
Physician Discharge Summary  Patient ID: Shannon Yu MRN: [id number] DOB/AGE: 57-15-1957 57 y.o.  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 female found to have neck remnant of basilar apex aneurysm s/p endovascular embolization on 05/09/2012 followed by additional treatment on 09/03/2012. The patient is neurologically asymptomatic. She was seen in consult on 01/18/14 and scheduled today for basilar aneurysm embolization with coils and possible stent placement with general anesthesia.  Summary: Pt brought to [facility] suite on 8/6. GET anesthesia initiated. Pt underwent successful angiographic stenting across wide neck of basilar apec aneurysm. No intra-procedural complications. She taken to neuro ICU in stable condition. No overnight complications. Pt has been able to ambulate, eat, and void without difficulty or issue. Neuro exam is unremarkable. POD #1 labs are normal. She is determined to be stable for discharge. All instructions and medications were reviewed. Follow up plans for 2 weeks discussed with pt and family.  Labs: CBC    Component Value Date/Time   WBC 7.7 02/11/2014 0410   RBC 3.61* 02/11/2014 0410   HGB 11.9* 02/11/2014 0410   HCT 35.4* 02/11/2014 0410   PLT 189 02/11/2014 0410    BMET    Component Value Date/Time   NA 140 02/11/2014 0410   K 3.8 02/11/2014 0410   CL 109 02/11/2014 0410   CO2 21 02/11/2014 0410   GLUCOSE 111* 02/11/2014 0410   BUN 7 02/11/2014 0410   CREATININE 0.66 02/11/2014 0410   CALCIUM 8.1* 02/11/2014 0410   GFRNONAA >90 02/11/2014 0410   GFRAA >90 02/11/2014 0410    P2Y12: 171  Consults: None   Discharge Exam: Blood pressure 116/53, pulse 71, temperature 98.2 F (36.8 C), temperature source Axillary, resp. rate 14, height 5\' 8"  (1.727 m), weight 175 lb 0.7 oz (79.4 kg), SpO2 95.00%. Lungs: CTA without w/r/r Heart: Regular Ext: (R)groin site c/d/i, no hematoma Neuro: Speech clear, tongue midline, smile symmetrical, equal strength upper and lower extremities 5/5, fine motor intact, EOMI, no ataxia   Disposition: 01-Home or Self Care  Discharge Instructions   Call MD for:  difficulty breathing, headache or visual disturbances    Complete by:  As directed      Call MD for:  persistant nausea and vomiting    Complete by:  As directed      Call MD for:  redness, tenderness, or signs of infection (pain, swelling, redness, odor or green/yellow discharge around incision site)    Complete by:  As directed      Call MD for:  severe uncontrolled pain    Complete by:  As directed      Call MD for:  temperature >100.4    Complete by:  As directed      Diet - low sodium heart healthy    Complete by:  As directed      Driving Restrictions    Complete by:  As directed   No driving for 2-3 days     Increase activity slowly    Complete by:  As directed      Lifting restrictions    Complete by:  As directed  No heavy lifting, pushing, pulling more than 10lbs for 2 weeks.     May shower / Bathe    Complete by:  As directed      May walk up steps    Complete by:  As directed      Remove dressing in 24 hours    Complete by:  As directed             Medication List         aspirin 325 MG tablet  Take 325 mg by mouth daily.     cetirizine 10 MG tablet  Commonly known as:  ZYRTEC  Take 10 mg by mouth at bedtime.     FISH OIL PO  Take 1 capsule by mouth daily.     GLUCOSAMINE PO  Take 1 tablet by mouth daily. *Triple Strength*     JINTELI 1-5 MG-MCG Tabs  Generic drug:  norethindrone-ethinyl estradiol  Take 1 tablet by mouth daily.      loratadine 10 MG tablet  Commonly known as:  CLARITIN  Take 10 mg by mouth every morning.     PLAVIX PO  Take by mouth.           Follow-up Information   Follow up with Rob Hickman, MD. Schedule an appointment as soon as possible for a visit in 2 weeks. Anderson Malta from clinic will call with appointment date/time)    Specialty:  Interventional Radiology   Contact information:   [address] [place] [address] [phone]       Signed: Ascencion Dike PA-C 02/11/2014, 10:03 AM

## 2014-02-11 NOTE — Progress Notes (Signed)
Pt has ambulated and voided since catheter removed.VSS and assessment same. Meets requirements for dc home. Went over dc instructions and follow up apt. Pt already has medication at home to continue after dc. Will dc home with husband via wheelchair.

## 2014-02-11 NOTE — Progress Notes (Signed)
Subjective: Patient s/p LVIS Jr. 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 Headache from last pm is resolved. No N/V] Hasn;t eaten yet. Foley still in, hasn't been OOB  Objective: Physical Exam: BP 100/51  Pulse 66  Temp(Src) 98.2 F (36.8 C) (Axillary)  Resp 17  Ht 5\' 8"  (1.727 m)  Wt 175 lb 0.7 oz (79.4 kg)  BMI 26.62 kg/m2  SpO2 96%  General: A&Ox3, NAD, lying in bed, lip swollen and ecchymotic.  Heart: RRR without M/G/R Lungs: CTA bilaterally.  Abd: Soft, NT, ND Ext: RCFA access site dressing C/D/I, no bleeding or hematoma, DP intact bilaterally.  Neuro: Speech clear, tongue midline, smile symmetrical, equal strength upper and lower extremities 5/5, fine motor intact, EOMI, no ataxia  Labs: CBC  Recent Labs  02/11/14 0410  WBC 7.7  HGB 11.9*  HCT 35.4*  PLT 189   BMET  Recent Labs  02/11/14 0410  NA 140  K 3.8  CL 109  CO2 21  GLUCOSE 111*  BUN 7  CREATININE 0.66  CALCIUM 8.1*    Studies/Results: No results found.  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 DC a-line, foley Increase diet and activity DC home later this am.     LOS: 1 day    Ascencion Dike PA-C 02/11/2014 8:53 AM

## 2014-02-12 ENCOUNTER — Encounter: Payer: Commercial Managed Care - PPO | Admitting: Internal Medicine

## 2014-02-12 ENCOUNTER — Other Ambulatory Visit: Payer: Self-pay | Admitting: Radiology

## 2014-02-12 DIAGNOSIS — I609 Nontraumatic subarachnoid hemorrhage, unspecified: Secondary | ICD-10-CM

## 2014-02-12 DIAGNOSIS — I671 Cerebral aneurysm, nonruptured: Secondary | ICD-10-CM

## 2014-03-05 ENCOUNTER — Other Ambulatory Visit: Payer: Self-pay | Admitting: Radiology

## 2014-03-05 ENCOUNTER — Ambulatory Visit (HOSPITAL_COMMUNITY)
Admission: RE | Admit: 2014-03-05 | Discharge: 2014-03-05 | Disposition: A | Discharge status: Home / Self Care | Payer: Commercial Managed Care - PPO | Source: Ambulatory Visit | Attending: Radiology | Admitting: Radiology

## 2014-03-05 DIAGNOSIS — Z48812 Encounter for surgical aftercare following surgery on the circulatory system: Secondary | ICD-10-CM | POA: Diagnosis not present

## 2014-03-05 DIAGNOSIS — I609 Nontraumatic subarachnoid hemorrhage, unspecified: Secondary | ICD-10-CM

## 2014-03-05 DIAGNOSIS — I671 Cerebral aneurysm, nonruptured: Secondary | ICD-10-CM

## 2014-03-05 LAB — PLATELET INHIBITION P2Y12: Platelet Function  P2Y12: 1 [PRU] — ABNORMAL LOW (ref 194–418)

## 2014-04-06 ENCOUNTER — Encounter: Payer: Self-pay | Admitting: Internal Medicine

## 2014-04-06 NOTE — Telephone Encounter (Signed)
Offer Rx ambien 5 mg, 1 or 2 at bedtime as needed for sleep, # 30, ref x 1

## 2014-04-06 NOTE — Telephone Encounter (Signed)
Please advise on e-mail Dr Annamaria Boots thanks.

## 2014-04-07 MED ORDER — ZOLPIDEM TARTRATE 5 MG PO TABS: ORAL_TABLET | ORAL | Status: DC

## 2014-04-07 NOTE — Telephone Encounter (Signed)
Rx sent to pharm  Emailed the pt to inform her

## 2014-04-08 ENCOUNTER — Other Ambulatory Visit: Payer: Self-pay

## 2014-04-08 DIAGNOSIS — Z1239 Encounter for other screening for malignant neoplasm of breast: Secondary | ICD-10-CM

## 2014-05-12 ENCOUNTER — Other Ambulatory Visit: Payer: Self-pay

## 2014-05-12 ENCOUNTER — Ambulatory Visit
Admission: RE | Admit: 2014-05-12 | Discharge: 2014-05-12 | Disposition: A | Discharge status: Home / Self Care | Payer: Commercial Managed Care - PPO | Source: Ambulatory Visit

## 2014-05-12 DIAGNOSIS — Z1231 Encounter for screening mammogram for malignant neoplasm of breast: Secondary | ICD-10-CM

## 2014-05-28 ENCOUNTER — Encounter: Payer: Self-pay | Admitting: Internal Medicine

## 2014-06-07 ENCOUNTER — Encounter: Payer: Self-pay | Admitting: Internal Medicine

## 2014-06-07 ENCOUNTER — Ambulatory Visit (INDEPENDENT_AMBULATORY_CARE_PROVIDER_SITE_OTHER): Payer: Commercial Managed Care - PPO | Admitting: Internal Medicine

## 2014-06-07 VITALS — BP 130/82 | HR 80 | Temp 98.0°F | Ht 68.0 in | Wt 177.0 lb

## 2014-06-07 DIAGNOSIS — Z0000 Encounter for general adult medical examination without abnormal findings: Secondary | ICD-10-CM

## 2014-06-07 NOTE — Progress Notes (Signed)
Pre visit review using our clinic review tool, if applicable. No additional management support is needed unless otherwise documented below in the visit note. 

## 2014-06-07 NOTE — Patient Instructions (Addendum)
It was good to see you today.  We have reviewed your prior records including labs and tests today  Health Maintenance reviewed - all recommended immunizations and age-appropriate screenings are up-to-date.  Test(s) ordered today. Return this week when you are fasting. Your results will be released to [facility] (or called to you) after review, usually within 72hours after test completion. If any changes need to be made, you will be notified at that same time.  Medications reviewed and updated, no changes recommended at this time.  Please schedule followup in 12 months for annual exam and labs, call sooner if problems.  Health Maintenance Adopting a healthy lifestyle and getting preventive care can go a long way to promote health and wellness. Talk with your health care provider about what schedule of regular examinations is right for you. This is a good chance for you to check in with your provider about disease prevention and staying healthy. In between checkups, there are plenty of things you can do on your own. Experts have done a lot of research about which lifestyle changes and preventive measures are most likely to keep you healthy. Ask your health care provider for more information. WEIGHT AND DIET  Eat a healthy diet  Be sure to include plenty of vegetables, fruits, low-fat dairy products, and lean protein.  Do not eat a lot of foods high in solid fats, added sugars, or salt.  Get regular exercise. This is one of the most important things you can do for your health.  Most adults should exercise for at least 150 minutes each week. The exercise should increase your heart rate and make you sweat (moderate-intensity exercise).  Most adults should also do strengthening exercises at least twice a week. This is in addition to the moderate-intensity exercise.  Maintain a healthy weight  Body mass index (BMI) is a measurement that can be used to identify possible weight problems. It  estimates body fat based on height and weight. Your health care provider can help determine your BMI and help you achieve or maintain a healthy weight.  For females 26 years of age and older:   A BMI below 18.5 is considered underweight.  A BMI of 18.5 to 24.9 is normal.  A BMI of 25 to 29.9 is considered overweight.  A BMI of 30 and above is considered obese.  Watch levels of cholesterol and blood lipids  You should start having your blood tested for lipids and cholesterol at 57 years of age, then have this test every 5 years.  You may need to have your cholesterol levels checked more often if:  Your lipid or cholesterol levels are high.  You are older than 57 years of age.  You are at high risk for heart disease.  CANCER SCREENING   Lung Cancer  Lung cancer screening is recommended for adults 50-59 years old who are at high risk for lung cancer because of a history of smoking.  A yearly low-dose CT scan of the lungs is recommended for people who:  Currently smoke.  Have quit within the past 15 years.  Have at least a 30-pack-year history of smoking. A pack year is smoking an average of one pack of cigarettes a day for 1 year.  Yearly screening should continue until it has been 15 years since you quit.  Yearly screening should stop if you develop a health problem that would prevent you from having lung cancer treatment.  Breast Cancer  Practice breast self-awareness. This means  understanding how your breasts normally appear and feel.  It also means doing regular breast self-exams. Let your health care provider know about any changes, no matter how small.  If you are in your 20s or 30s, you should have a clinical breast exam (CBE) by a health care provider every 1-3 years as part of a regular health exam.  If you are 25 or older, have a CBE every year. Also consider having a breast X-ray (mammogram) every year.  If you have a family history of breast cancer, talk  to your health care provider about genetic screening.  If you are at high risk for breast cancer, talk to your health care provider about having an MRI and a mammogram every year.  Breast cancer gene (BRCA) assessment is recommended for women who have family members with BRCA-related cancers. BRCA-related cancers include:  Breast.  Ovarian.  Tubal.  Peritoneal cancers.  Results of the assessment will determine the need for genetic counseling and BRCA1 and BRCA2 testing. Cervical Cancer Routine pelvic examinations to screen for cervical cancer are no longer recommended for nonpregnant women who are considered low risk for cancer of the pelvic organs (ovaries, uterus, and vagina) and who do not have symptoms. A pelvic examination may be necessary if you have symptoms including those associated with pelvic infections. Ask your health care provider if a screening pelvic exam is right for you.   The Pap test is the screening test for cervical cancer for women who are considered at risk.  If you had a hysterectomy for a problem that was not cancer or a condition that could lead to cancer, then you no longer need Pap tests.  If you are older than 65 years, and you have had normal Pap tests for the past 10 years, you no longer need to have Pap tests.  If you have had past treatment for cervical cancer or a condition that could lead to cancer, you need Pap tests and screening for cancer for at least 20 years after your treatment.  If you no longer get a Pap test, assess your risk factors if they change (such as having a new sexual partner). This can affect whether you should start being screened again.  Some women have medical problems that increase their chance of getting cervical cancer. If this is the case for you, your health care provider may recommend more frequent screening and Pap tests.  The human papillomavirus (HPV) test is another test that may be used for cervical cancer screening.  The HPV test looks for the virus that can cause cell changes in the cervix. The cells collected during the Pap test can be tested for HPV.  The HPV test can be used to screen women 67 years of age and older. Getting tested for HPV can extend the interval between normal Pap tests from three to five years.  An HPV test also should be used to screen women of any age who have unclear Pap test results.  After 57 years of age, women should have HPV testing as often as Pap tests.  Colorectal Cancer  This type of cancer can be detected and often prevented.  Routine colorectal cancer screening usually begins at 56 years of age and continues through 57 years of age.  Your health care provider may recommend screening at an earlier age if you have risk factors for colon cancer.  Your health care provider may also recommend using home test kits to check for hidden blood  in the stool.  A small camera at the end of a tube can be used to examine your colon directly (sigmoidoscopy or colonoscopy). This is done to check for the earliest forms of colorectal cancer.  Routine screening usually begins at age 62.  Direct examination of the colon should be repeated every 5-10 years through 57 years of age. However, you may need to be screened more often if early forms of precancerous polyps or small growths are found. Skin Cancer  Check your skin from head to toe regularly.  Tell your health care provider about any new moles or changes in moles, especially if there is a change in a mole's shape or color.  Also tell your health care provider if you have a mole that is larger than the size of a pencil eraser.  Always use sunscreen. Apply sunscreen liberally and repeatedly throughout the day.  Protect yourself by wearing long sleeves, pants, a wide-brimmed hat, and sunglasses whenever you are outside. HEART DISEASE, DIABETES, AND HIGH BLOOD PRESSURE   Have your blood pressure checked at least every 1-2  years. High blood pressure causes heart disease and increases the risk of stroke.  If you are between 48 years and 59 years old, ask your health care provider if you should take aspirin to prevent strokes.  Have regular diabetes screenings. This involves taking a blood sample to check your fasting blood sugar level.  If you are at a normal weight and have a low risk for diabetes, have this test once every three years after 57 years of age.  If you are overweight and have a high risk for diabetes, consider being tested at a younger age or more often. PREVENTING INFECTION  Hepatitis B  If you have a higher risk for hepatitis B, you should be screened for this virus. You are considered at high risk for hepatitis B if:  You were born in a country where hepatitis B is common. Ask your health care provider which countries are considered high risk.  Your parents were born in a high-risk country, and you have not been immunized against hepatitis B (hepatitis B vaccine).  You have HIV or AIDS.  You use needles to inject street drugs.  You live with someone who has hepatitis B.  You have had sex with someone who has hepatitis B.  You get hemodialysis treatment.  You take certain medicines for conditions, including cancer, organ transplantation, and autoimmune conditions. Hepatitis C  Blood testing is recommended for:  Everyone born from [birth year] through 1964.  Anyone with known risk factors for hepatitis C. Sexually transmitted infections (STIs)  You should be screened for sexually transmitted infections (STIs) including gonorrhea and chlamydia if:  You are sexually active and are younger than 57 years of age.  You are older than 57 years of age and your health care provider tells you that you are at risk for this type of infection.  Your sexual activity has changed since you were last screened and you are at an increased risk for chlamydia or gonorrhea. Ask your health care provider if  you are at risk.  If you do not have HIV, but are at risk, it may be recommended that you take a prescription medicine daily to prevent HIV infection. This is called pre-exposure prophylaxis (PrEP). You are considered at risk if:  You are sexually active and do not regularly use condoms or know the HIV status of your partner(s).  You take drugs by injection.  You are sexually active with a partner who has HIV. Talk with your health care provider about whether you are at high risk of being infected with HIV. If you choose to begin PrEP, you should first be tested for HIV. You should then be tested every 3 months for as long as you are taking PrEP.  PREGNANCY   If you are premenopausal and you may become pregnant, ask your health care provider about preconception counseling.  If you may become pregnant, take 400 to 800 micrograms (mcg) of folic acid every day.  If you want to prevent pregnancy, talk to your health care provider about birth control (contraception). OSTEOPOROSIS AND MENOPAUSE   Osteoporosis is a disease in which the bones lose minerals and strength with aging. This can result in serious bone fractures. Your risk for osteoporosis can be identified using a bone density scan.  If you are 51 years of age or older, or if you are at risk for osteoporosis and fractures, ask your health care provider if you should be screened.  Ask your health care provider whether you should take a calcium or vitamin D supplement to lower your risk for osteoporosis.  Menopause may have certain physical symptoms and risks.  Hormone replacement therapy may reduce some of these symptoms and risks. Talk to your health care provider about whether hormone replacement therapy is right for you.  HOME CARE INSTRUCTIONS   Schedule regular health, dental, and eye exams.  Stay current with your immunizations.   Do not use any tobacco products including cigarettes, chewing tobacco, or electronic  cigarettes.  If you are pregnant, do not drink alcohol.  If you are breastfeeding, limit how much and how often you drink alcohol.  Limit alcohol intake to no more than 1 drink per day for nonpregnant women. One drink equals 12 ounces of beer, 5 ounces of wine, or 1 ounces of hard liquor.  Do not use street drugs.  Do not share needles.  Ask your health care provider for help if you need support or information about quitting drugs.  Tell your health care provider if you often feel depressed.  Tell your health care provider if you have ever been abused or do not feel safe at home. Document Released: 01/08/2011 Document Revised: 11/09/2013 Document Reviewed: 05/27/2013 [facility] Patient Information 2015 [place], [place]. This information is not intended to replace advice given to you by your health care provider. Make sure you discuss any questions you have with your health care provider.

## 2014-06-07 NOTE — Progress Notes (Signed)
Subjective:    Patient ID: Shannon Yu, female    DOB: 11-29-1955, 58 y.o.   MRN: [id number]  HPI  patient is here today for annual physical. Patient feels well and has no complaints.  Also reviewed chronic medical issues and interval medical events (last OV 01/2013)  Past Medical History  Diagnosis Date  . Menopause   . Environmental allergies   . Migraine   . Sleep apnea 06/2011 dx    CPAP 13/[facility]  . Osteoarthritis     left knee  . Brain aneurysm 05/09/12    coil/stent, angio with stent 02/2014  . Vertigo   . History of shingles    Family History  Problem Relation Age of Onset  . Emphysema Father     smoker  . Lung cancer Father 16  . Prostate cancer Father   . Kidney disease Father     CANCER  . Scleroderma Mother 54   History  Substance Use Topics  . Smoking status: Former Smoker -- 0.25 packs/day for 10 years    Types: Cigarettes  . Smokeless tobacco: Never Used  . Alcohol Use: 0.0 oz/week    0 Not specified per week     Comment: 1-2 drinks 3-5 times a week    Review of Systems  Constitutional: Negative for fatigue and unexpected weight change.  Eyes: Positive for visual disturbance (episodic spells related to "stress" events - has been eval by [facility]).  Respiratory: Negative for cough, shortness of breath and wheezing.   Cardiovascular: Negative for chest pain, palpitations and leg swelling.  Gastrointestinal: Negative for nausea, abdominal pain and diarrhea.  Neurological: Negative for dizziness, weakness, light-headedness and headaches.  Psychiatric/Behavioral: Negative for dysphoric mood. The patient is not nervous/anxious.   All other systems reviewed and are negative.      Objective:   Physical Exam  BP 130/82 mmHg  Pulse 80  Temp(Src) 98 F (36.7 C) (Oral)  Ht 5\' 8"  (1.727 m)  Wt 177 lb (80.287 kg)  BMI 26.92 kg/m2  SpO2 97% Wt Readings from Last 3 Encounters:  06/07/14 177 lb (80.287 kg)  02/10/14 175 lb 0.7 oz (79.4 kg)   02/10/14 173 lb (78.472 kg)   Constitutional: She appears well-developed and well-nourished. No distress.  HENT: Head: Normocephalic and atraumatic. Ears: B TMs ok, no erythema or effusion; Nose: Nose normal. Mouth/Throat: Oropharynx is clear and moist. No oropharyngeal exudate.  Eyes: Conjunctivae and EOM are normal. Pupils are equal, round, and reactive to light. No scleral icterus.  Neck: Normal range of motion. Neck supple. No JVD present. No thyromegaly present.  Cardiovascular: Normal rate, regular rhythm and normal heart sounds.  No murmur heard. No BLE edema. Pulmonary/Chest: Effort normal and breath sounds normal. No respiratory distress. She has no wheezes.  Abdominal: Soft. Bowel sounds are normal. She exhibits no distension. There is no tenderness. no masses GU/breast: defer to gyn Musculoskeletal: Normal range of motion, no joint effusions. No gross deformities Neurological: She is alert and oriented to person, place, and time. No cranial nerve deficit. Coordination, balance, strength, speech and gait are normal.  Skin: Skin is warm and dry. No rash noted. No erythema.  Psychiatric: She has a normal mood and affect. Her behavior is normal. Judgment and thought content normal.    Lab Results  Component Value Date   WBC 7.7 02/11/2014   HGB 11.9* 02/11/2014   HCT 35.4* 02/11/2014   PLT 189 02/11/2014   GLUCOSE 111* 02/11/2014  CHOL 229* 07/23/2012   TRIG 98 07/23/2012   HDL 56 07/23/2012   LDLCALC 153 07/23/2012   ALT 13 08/28/2012   AST 19 08/28/2012   NA 140 02/11/2014   K 3.8 02/11/2014   CL 109 02/11/2014   CREATININE 0.66 02/11/2014   BUN 7 02/11/2014   CO2 21 02/11/2014   TSH 1.16 07/23/2012   INR 0.99 02/05/2014   HGBA1C 5.7 07/23/2012    Mm Screening Breast Tomo Bilateral  05/13/2014   CLINICAL DATA:  Screening.  EXAM: DIGITAL SCREENING BILATERAL MAMMOGRAM WITH 3D TOMO WITH CAD  COMPARISON:  Previous exam(s).  ACR Breast Density Category c: The breast  tissue is heterogeneously dense, which may obscure small masses.  FINDINGS: There are no findings suspicious for malignancy. Images were processed with CAD.  IMPRESSION: No mammographic evidence of malignancy. A result letter of this screening mammogram will be mailed directly to the patient.  RECOMMENDATION: Screening mammogram in one year. (Code:SM-B-01Y)  BI-RADS CATEGORY  1: Negative.   Electronically Signed   By: Shon Hale M.D.   On: 05/13/2014 12:45       Assessment & Plan:   CPX/z00.00 - Patient has been counseled on age-appropriate routine health concerns for screening and prevention. These are reviewed and up-to-date. Immunizations are up-to-date or declined. Labs ordered and reviewed.

## 2014-06-09 ENCOUNTER — Other Ambulatory Visit (INDEPENDENT_AMBULATORY_CARE_PROVIDER_SITE_OTHER): Payer: Commercial Managed Care - PPO

## 2014-06-09 DIAGNOSIS — Z0000 Encounter for general adult medical examination without abnormal findings: Secondary | ICD-10-CM

## 2014-06-09 LAB — CBC WITH DIFFERENTIAL/PLATELET
BASOS ABS: 0 10*3/uL (ref 0.0–0.1)
Basophils Relative: 0.3 % (ref 0.0–3.0)
EOS PCT: 0.7 % (ref 0.0–5.0)
Eosinophils Absolute: 0 10*3/uL (ref 0.0–0.7)
HCT: 40.4 % (ref 36.0–46.0)
Hemoglobin: 13.4 g/dL (ref 12.0–15.0)
Lymphocytes Relative: 38.4 % (ref 12.0–46.0)
Lymphs Abs: 1.8 10*3/uL (ref 0.7–4.0)
MCHC: 33.3 g/dL (ref 30.0–36.0)
MCV: 98.2 fl (ref 78.0–100.0)
MONO ABS: 0.5 10*3/uL (ref 0.1–1.0)
Monocytes Relative: 9.7 % (ref 3.0–12.0)
NEUTROS PCT: 50.9 % (ref 43.0–77.0)
Neutro Abs: 2.4 10*3/uL (ref 1.4–7.7)
PLATELETS: 217 10*3/uL (ref 150.0–400.0)
RBC: 4.11 Mil/uL (ref 3.87–5.11)
RDW: 12.7 % (ref 11.5–15.5)
WBC: 4.8 10*3/uL (ref 4.0–10.5)

## 2014-06-09 LAB — TSH: TSH: 1.79 u[IU]/mL (ref 0.35–4.50)

## 2014-06-09 LAB — URINALYSIS, ROUTINE W REFLEX MICROSCOPIC
BILIRUBIN URINE: NEGATIVE
Ketones, ur: NEGATIVE
Nitrite: NEGATIVE
Specific Gravity, Urine: 1.02 (ref 1.000–1.030)
TOTAL PROTEIN, URINE-UPE24: NEGATIVE
URINE GLUCOSE: NEGATIVE
Urobilinogen, UA: 0.2 (ref 0.0–1.0)
pH: 6 (ref 5.0–8.0)

## 2014-06-09 LAB — BASIC METABOLIC PANEL
BUN: 10 mg/dL (ref 6–23)
CHLORIDE: 107 meq/L (ref 96–112)
CO2: 21 mEq/L (ref 19–32)
Calcium: 9.5 mg/dL (ref 8.4–10.5)
Creatinine, Ser: 1 mg/dL (ref 0.4–1.2)
GFR: 61.39 mL/min (ref >60.00)
Glucose, Bld: 88 mg/dL (ref 70–99)
Potassium: 4.1 mEq/L (ref 3.5–5.1)
Sodium: 138 mEq/L (ref 135–145)

## 2014-06-09 LAB — LIPID PANEL
Cholesterol: 193 mg/dL (ref 0–200)
HDL: 49.3 mg/dL (ref >39.00)
LDL CALC: 133 mg/dL — AB (ref 0–99)
NonHDL: 143.7
TRIGLYCERIDES: 55 mg/dL (ref 0.0–149.0)
Total CHOL/HDL Ratio: 4
VLDL: 11 mg/dL (ref 0.0–40.0)

## 2014-06-09 LAB — HEPATIC FUNCTION PANEL
ALT: 17 U/L (ref 0–35)
AST: 20 U/L (ref 0–37)
Albumin: 3.9 g/dL (ref 3.5–5.2)
Alkaline Phosphatase: 66 U/L (ref 39–117)
BILIRUBIN DIRECT: 0.1 mg/dL (ref 0.0–0.3)
Total Bilirubin: 0.7 mg/dL (ref 0.2–1.2)
Total Protein: 7.2 g/dL (ref 6.0–8.3)

## 2014-06-16 ENCOUNTER — Other Ambulatory Visit (HOSPITAL_COMMUNITY): Payer: Self-pay | Admitting: Interventional Radiology

## 2014-06-16 DIAGNOSIS — I729 Aneurysm of unspecified site: Secondary | ICD-10-CM

## 2014-07-07 ENCOUNTER — Ambulatory Visit (HOSPITAL_COMMUNITY)
Admission: RE | Admit: 2014-07-07 | Discharge: 2014-07-07 | Disposition: A | Discharge status: Home / Self Care | Payer: Commercial Managed Care - PPO | Source: Ambulatory Visit | Attending: Interventional Radiology | Admitting: Interventional Radiology

## 2014-07-07 ENCOUNTER — Ambulatory Visit (HOSPITAL_COMMUNITY): Payer: Commercial Managed Care - PPO

## 2014-07-07 DIAGNOSIS — I671 Cerebral aneurysm, nonruptured: Secondary | ICD-10-CM | POA: Diagnosis not present

## 2014-07-07 DIAGNOSIS — R930 Abnormal findings on diagnostic imaging of skull and head, not elsewhere classified: Secondary | ICD-10-CM | POA: Insufficient documentation

## 2014-07-07 DIAGNOSIS — I729 Aneurysm of unspecified site: Secondary | ICD-10-CM

## 2014-07-07 DIAGNOSIS — Z9889 Other specified postprocedural states: Secondary | ICD-10-CM | POA: Insufficient documentation

## 2014-07-07 MED ORDER — GADOBENATE DIMEGLUMINE 529 MG/ML IV SOLN
17.0000 mL | Freq: Once | INTRAVENOUS | Status: AC | PRN
  Administered 2014-07-07: 17 mL via INTRAVENOUS

## 2014-07-30 ENCOUNTER — Ambulatory Visit: Payer: Commercial Managed Care - PPO | Admitting: Internal Medicine

## 2014-08-12 ENCOUNTER — Ambulatory Visit: Payer: Commercial Managed Care - PPO | Admitting: Internal Medicine

## 2014-09-21 ENCOUNTER — Telehealth (HOSPITAL_COMMUNITY): Payer: Self-pay | Admitting: Interventional Radiology

## 2014-09-21 NOTE — Telephone Encounter (Signed)
Called pt, left VM that I will call her around June 2016 to schedule 6 month f/u - aneurysm JM

## 2014-09-23 ENCOUNTER — Ambulatory Visit (INDEPENDENT_AMBULATORY_CARE_PROVIDER_SITE_OTHER): Payer: Commercial Managed Care - PPO | Admitting: Internal Medicine

## 2014-09-23 ENCOUNTER — Encounter: Payer: Self-pay | Admitting: Internal Medicine

## 2014-09-23 VITALS — BP 118/72 | HR 82 | Ht 68.0 in | Wt 179.0 lb

## 2014-09-23 DIAGNOSIS — J302 Other seasonal allergic rhinitis: Secondary | ICD-10-CM

## 2014-09-23 DIAGNOSIS — J309 Allergic rhinitis, unspecified: Secondary | ICD-10-CM

## 2014-09-23 DIAGNOSIS — G4733 Obstructive sleep apnea (adult) (pediatric): Secondary | ICD-10-CM

## 2014-09-23 DIAGNOSIS — J3089 Other allergic rhinitis: Secondary | ICD-10-CM

## 2014-09-23 NOTE — Patient Instructions (Signed)
Order- DME [facility] Romans reduce CPAP to 12, order download for pressure compliance dx OSA

## 2014-09-23 NOTE — Progress Notes (Signed)
06/26/11- 59 yoFnever smoker seeking evaluation for sleep apnea. Boyfriend describes loud snore and witnessed apnea. She feels sleep quality is poor and unrestful since menopause at age 58. Tylenol PM helps her fall asleep. She had been evaluated here in the past for allergic rhinitis. Nasal congestion contributes to her snoring. She avoids naps. One cup of coffee in the morning only. Bedtime between 8 and 10 PM based on when she plays tennis. Short sleep latency, waking once before up between 5:30 and 5:45 AM. Weight is up about 8 pounds in the last 2 years. No ENT surgery. No history of cardiopulmonary disease. Brother uses CPAP for sleep apnea.  07/25/11-  59 yoFnever smoker followed for OSA, allergic rhinitis NPSG 07/06/11- Mild OSA AHI 7.8/ hr, nonpositional, loud snoring, desat to 83%.   we discussed obstructive sleep apnea in this range , medical significance and spectrum of available treatments. She is not much concerned and that is appropriate. Medical impact is unlikely to be much as long as she doesn't gain weight. We agreed not to treat at this time.  11/21/12- 59 yoFnever smoker followed for OSA, allergic rhinitis, hx CVA/ aneurysm FOLLOWS FOR: not currently using CPAP machine. Had a ruptured brain anyurseum in 05/2012. Feels like she is extremely tired since that happened. Blames upset about sister in law died of lung cancer for stress leading to ruptured brain aneurysm-coiled. Since then stays tired, had to stop exercise program. Wants to try CPAP to regain energy.  7/101/4- 59 yoFnever smoker followed for OSA, allergic rhinitis, hx CVA/ aneurysm follows for:  currently using the CPAP13/ [facility]  nightly.  no problems.  She likes her CPAP. Comfort discussed. Less tired.  07/24/14- 59 yoFnever smoker followed for OSA, allergic rhinitis, hx CVA/ aneurysm FOLLOWS FOR: wears CPAP 13 nightly; DME is [facility]  Feels well controlled. Nasal pillows mask. No problems with nasal irritation  now  09/23/14- 57 yoFnever smoker followed for OSA, allergic rhinitis,  hx aneurysm/CVA/ aneurysm, allergic rhinitis FOLLOWS FOR: wears CPAP 13 nightly; DME [facility]  She is doing very well with CPAP except she feels she is swallowing too much air. We discussed reducing pressure to 12 for trial.  ROS-see HPI Constitutional:   No-   weight loss, night sweats, fevers, chills, fatigue, lassitude. HEENT:   No-  headaches, difficulty swallowing, tooth/dental problems, sore throat,       Some  sneezing, itching, ear ache, nasal congestion, post nasal drip,  CV:  No-   chest pain, orthopnea, PND, swelling in lower extremities, anasarca, dizziness, palpitations Resp: No-   shortness of breath with exertion or at rest.              No-   productive cough,  No non-productive cough,  No- coughing up of blood,   change in color of mucus.  No- wheezing.   Skin: No-   rash or lesions. GI:  No-   heartburn, indigestion, abdominal pain, nausea, vomiting,  GU: MS:  Neuro-     nothing unusual Psych:  No- change in mood or affect. No depression or anxiety.  No memory loss.  OBJ General- Alert, Oriented, Affect-appropriate, Distress- none acute, tall Skin- rash-none, lesions- none, excoriation- none Lymphadenopathy- none Head- atraumatic            Eyes- Gross vision intact, PERRLA, conjunctivae clear secretions            Ears- Hearing, canals-normal            Nose- Clear,  no-Septal dev, mucus, polyps, erosion, perforation             Throat- Mallampati II-III , mucosa clear , drainage- none, tonsils- atrophic Neck- flexible , trachea midline, no stridor , thyroid nl, carotid no bruit Chest - symmetrical excursion , unlabored           Heart/CV- RRR , no murmur , no gallop  , no rub, nl s1 s2                           - JVD- none , edema- none, stasis changes- none, varices- none           Lung- clear to P&A, wheeze- none, cough- none , dullness-none, rub- none           Chest wall-  Abd- Br/ Gen/  Rectal- Not done, not indicated Extrem- cyanosis- none, clubbing, none, atrophy- none, strength- nl Neuro- grossly intact to observation

## 2014-09-26 NOTE — Assessment & Plan Note (Signed)
Has not had significant early Spring symptoms yet. We discussed antihistamines if needed.

## 2014-09-26 NOTE — Assessment & Plan Note (Signed)
Good compliance and control but she is swallowing and uncomfortable amount of air. Plan-try reducing pressure to 12 and request download.

## 2015-01-06 ENCOUNTER — Telehealth (HOSPITAL_COMMUNITY): Payer: Self-pay

## 2015-01-06 NOTE — Telephone Encounter (Signed)
Called pt to schedule 6 month f/u MRI.the patient stated that she is doing great and is not having any symptoms and wants to wait and do the MRI next year. AW

## 2015-05-26 ENCOUNTER — Other Ambulatory Visit: Payer: Self-pay

## 2015-05-26 DIAGNOSIS — Z1231 Encounter for screening mammogram for malignant neoplasm of breast: Secondary | ICD-10-CM

## 2015-06-09 ENCOUNTER — Encounter: Payer: Commercial Managed Care - PPO | Admitting: Internal Medicine

## 2015-06-22 ENCOUNTER — Ambulatory Visit
Admission: RE | Admit: 2015-06-22 | Discharge: 2015-06-22 | Disposition: A | Discharge status: Home / Self Care | Payer: Commercial Managed Care - PPO | Source: Ambulatory Visit

## 2015-06-22 DIAGNOSIS — Z1231 Encounter for screening mammogram for malignant neoplasm of breast: Secondary | ICD-10-CM

## 2015-06-30 ENCOUNTER — Ambulatory Visit: Payer: Commercial Managed Care - PPO

## 2015-07-13 ENCOUNTER — Encounter: Payer: Commercial Managed Care - PPO | Admitting: Internal Medicine

## 2015-09-23 ENCOUNTER — Ambulatory Visit (INDEPENDENT_AMBULATORY_CARE_PROVIDER_SITE_OTHER): Payer: Commercial Managed Care - PPO | Admitting: Internal Medicine

## 2015-09-23 ENCOUNTER — Encounter: Payer: Self-pay | Admitting: Internal Medicine

## 2015-09-23 ENCOUNTER — Other Ambulatory Visit (INDEPENDENT_AMBULATORY_CARE_PROVIDER_SITE_OTHER): Payer: Commercial Managed Care - PPO

## 2015-09-23 VITALS — BP 110/68 | HR 84 | Temp 98.6°F | Resp 12 | Ht 68.0 in | Wt 183.0 lb

## 2015-09-23 DIAGNOSIS — Z0000 Encounter for general adult medical examination without abnormal findings: Secondary | ICD-10-CM | POA: Diagnosis not present

## 2015-09-23 DIAGNOSIS — I10 Essential (primary) hypertension: Secondary | ICD-10-CM

## 2015-09-23 LAB — COMPREHENSIVE METABOLIC PANEL
ALK PHOS: 77 U/L (ref 39–117)
ALT: 17 U/L (ref 0–35)
AST: 18 U/L (ref 0–37)
Albumin: 4.2 g/dL (ref 3.5–5.2)
BILIRUBIN TOTAL: 0.4 mg/dL (ref 0.2–1.2)
BUN: 14 mg/dL (ref 6–23)
CO2: 28 mEq/L (ref 19–32)
CREATININE: 0.88 mg/dL (ref 0.40–1.20)
Calcium: 9.3 mg/dL (ref 8.4–10.5)
Chloride: 103 mEq/L (ref 96–112)
GFR: 70.01 mL/min (ref >60.00)
GLUCOSE: 98 mg/dL (ref 70–99)
Potassium: 3.6 mEq/L (ref 3.5–5.1)
SODIUM: 139 meq/L (ref 135–145)
TOTAL PROTEIN: 7.5 g/dL (ref 6.0–8.3)

## 2015-09-23 NOTE — Assessment & Plan Note (Signed)
Checking BMP, other labs recently and will scan to records. Needs to exercise more and talked about sun safety and mole surveillance. Colonoscopy and mammogram and pap smear are up to date.

## 2015-09-23 NOTE — Patient Instructions (Signed)
We will check the kidneys today with a blood test.   We can see you back in about 6-12 months for a check up. If you have any problems or questions sooner please call the office.   Think about adding tumeric for the joints.   Health Maintenance, Female Adopting a healthy lifestyle and getting preventive care can go a long way to promote health and wellness. Talk with your health care provider about what schedule of regular examinations is right for you. This is a good chance for you to check in with your provider about disease prevention and staying healthy. In between checkups, there are plenty of things you can do on your own. Experts have done a lot of research about which lifestyle changes and preventive measures are most likely to keep you healthy. Ask your health care provider for more information. WEIGHT AND DIET  Eat a healthy diet  Be sure to include plenty of vegetables, fruits, low-fat dairy products, and lean protein.  Do not eat a lot of foods high in solid fats, added sugars, or salt.  Get regular exercise. This is one of the most important things you can do for your health.  Most adults should exercise for at least 150 minutes each week. The exercise should increase your heart rate and make you sweat (moderate-intensity exercise).  Most adults should also do strengthening exercises at least twice a week. This is in addition to the moderate-intensity exercise.  Maintain a healthy weight  Body mass index (BMI) is a measurement that can be used to identify possible weight problems. It estimates body fat based on height and weight. Your health care provider can help determine your BMI and help you achieve or maintain a healthy weight.  For females 79 years of age and older:   A BMI below 18.5 is considered underweight.  A BMI of 18.5 to 24.9 is normal.  A BMI of 25 to 29.9 is considered overweight.  A BMI of 30 and above is considered obese.  Watch levels of  cholesterol and blood lipids  You should start having your blood tested for lipids and cholesterol at 59 years of age, then have this test every 5 years.  You may need to have your cholesterol levels checked more often if:  Your lipid or cholesterol levels are high.  You are older than 59 years of age.  You are at high risk for heart disease.  CANCER SCREENING   Lung Cancer  Lung cancer screening is recommended for adults 21-72 years old who are at high risk for lung cancer because of a history of smoking.  A yearly low-dose CT scan of the lungs is recommended for people who:  Currently smoke.  Have quit within the past 15 years.  Have at least a 30-pack-year history of smoking. A pack year is smoking an average of one pack of cigarettes a day for 1 year.  Yearly screening should continue until it has been 15 years since you quit.  Yearly screening should stop if you develop a health problem that would prevent you from having lung cancer treatment.  Breast Cancer  Practice breast self-awareness. This means understanding how your breasts normally appear and feel.  It also means doing regular breast self-exams. Let your health care provider know about any changes, no matter how small.  If you are in your 20s or 30s, you should have a clinical breast exam (CBE) by a health care provider every 1-3 years as part  of a regular health exam.  If you are 77 or older, have a CBE every year. Also consider having a breast X-ray (mammogram) every year.  If you have a family history of breast cancer, talk to your health care provider about genetic screening.  If you are at high risk for breast cancer, talk to your health care provider about having an MRI and a mammogram every year.  Breast cancer gene (BRCA) assessment is recommended for women who have family members with BRCA-related cancers. BRCA-related cancers include:  Breast.  Ovarian.  Tubal.  Peritoneal  cancers.  Results of the assessment will determine the need for genetic counseling and BRCA1 and BRCA2 testing. Cervical Cancer Your health care provider may recommend that you be screened regularly for cancer of the pelvic organs (ovaries, uterus, and vagina). This screening involves a pelvic examination, including checking for microscopic changes to the surface of your cervix (Pap test). You may be encouraged to have this screening done every 3 years, beginning at age 87.  For women ages 85-65, health care providers may recommend pelvic exams and Pap testing every 3 years, or they may recommend the Pap and pelvic exam, combined with testing for human papilloma virus (HPV), every 5 years. Some types of HPV increase your risk of cervical cancer. Testing for HPV may also be done on women of any age with unclear Pap test results.  Other health care providers may not recommend any screening for nonpregnant women who are considered low risk for pelvic cancer and who do not have symptoms. Ask your health care provider if a screening pelvic exam is right for you.  If you have had past treatment for cervical cancer or a condition that could lead to cancer, you need Pap tests and screening for cancer for at least 20 years after your treatment. If Pap tests have been discontinued, your risk factors (such as having a new sexual partner) need to be reassessed to determine if screening should resume. Some women have medical problems that increase the chance of getting cervical cancer. In these cases, your health care provider may recommend more frequent screening and Pap tests. Colorectal Cancer  This type of cancer can be detected and often prevented.  Routine colorectal cancer screening usually begins at 59 years of age and continues through 59 years of age.  Your health care provider may recommend screening at an earlier age if you have risk factors for colon cancer.  Your health care provider may also  recommend using home test kits to check for hidden blood in the stool.  A small camera at the end of a tube can be used to examine your colon directly (sigmoidoscopy or colonoscopy). This is done to check for the earliest forms of colorectal cancer.  Routine screening usually begins at age 20.  Direct examination of the colon should be repeated every 5-10 years through 59 years of age. However, you may need to be screened more often if early forms of precancerous polyps or small growths are found. Skin Cancer  Check your skin from head to toe regularly.  Tell your health care provider about any new moles or changes in moles, especially if there is a change in a mole's shape or color.  Also tell your health care provider if you have a mole that is larger than the size of a pencil eraser.  Always use sunscreen. Apply sunscreen liberally and repeatedly throughout the day.  Protect yourself by wearing long sleeves, pants, a  wide-brimmed hat, and sunglasses whenever you are outside. HEART DISEASE, DIABETES, AND HIGH BLOOD PRESSURE   High blood pressure causes heart disease and increases the risk of stroke. High blood pressure is more likely to develop in:  People who have blood pressure in the high end of the normal range (130-139/85-89 Wen Hg).  People who are overweight or obese.  People who are African American.  If you are 40-69 years of age, have your blood pressure checked every 3-5 years. If you are 39 years of age or older, have your blood pressure checked every year. You should have your blood pressure measured twice--once when you are at a hospital or clinic, and once when you are not at a hospital or clinic. Record the average of the two measurements. To check your blood pressure when you are not at a hospital or clinic, you can use:  An automated blood pressure machine at a pharmacy.  A home blood pressure monitor.  If you are between 2 years and 36 years old, ask your health  care provider if you should take aspirin to prevent strokes.  Have regular diabetes screenings. This involves taking a blood sample to check your fasting blood sugar level.  If you are at a normal weight and have a low risk for diabetes, have this test once every three years after 59 years of age.  If you are overweight and have a high risk for diabetes, consider being tested at a younger age or more often. PREVENTING INFECTION  Hepatitis B  If you have a higher risk for hepatitis B, you should be screened for this virus. You are considered at high risk for hepatitis B if:  You were born in a country where hepatitis B is common. Ask your health care provider which countries are considered high risk.  Your parents were born in a high-risk country, and you have not been immunized against hepatitis B (hepatitis B vaccine).  You have HIV or AIDS.  You use needles to inject street drugs.  You live with someone who has hepatitis B.  You have had sex with someone who has hepatitis B.  You get hemodialysis treatment.  You take certain medicines for conditions, including cancer, organ transplantation, and autoimmune conditions. Hepatitis C  Blood testing is recommended for:  Everyone born from [birth year] through 1964.  Anyone with known risk factors for hepatitis C. Sexually transmitted infections (STIs)  You should be screened for sexually transmitted infections (STIs) including gonorrhea and chlamydia if:  You are sexually active and are younger than 59 years of age.  You are older than 59 years of age and your health care provider tells you that you are at risk for this type of infection.  Your sexual activity has changed since you were last screened and you are at an increased risk for chlamydia or gonorrhea. Ask your health care provider if you are at risk.  If you do not have HIV, but are at risk, it may be recommended that you take a prescription medicine daily to prevent HIV  infection. This is called pre-exposure prophylaxis (PrEP). You are considered at risk if:  You are sexually active and do not regularly use condoms or know the HIV status of your partner(s).  You take drugs by injection.  You are sexually active with a partner who has HIV. Talk with your health care provider about whether you are at high risk of being infected with HIV. If you choose to begin PrEP,  you should first be tested for HIV. You should then be tested every 3 months for as long as you are taking PrEP.  PREGNANCY   If you are premenopausal and you may become pregnant, ask your health care provider about preconception counseling.  If you may become pregnant, take 400 to 800 micrograms (mcg) of folic acid every day.  If you want to prevent pregnancy, talk to your health care provider about birth control (contraception). OSTEOPOROSIS AND MENOPAUSE   Osteoporosis is a disease in which the bones lose minerals and strength with aging. This can result in serious bone fractures. Your risk for osteoporosis can be identified using a bone density scan.  If you are 38 years of age or older, or if you are at risk for osteoporosis and fractures, ask your health care provider if you should be screened.  Ask your health care provider whether you should take a calcium or vitamin D supplement to lower your risk for osteoporosis.  Menopause may have certain physical symptoms and risks.  Hormone replacement therapy may reduce some of these symptoms and risks. Talk to your health care provider about whether hormone replacement therapy is right for you.  HOME CARE INSTRUCTIONS   Schedule regular health, dental, and eye exams.  Stay current with your immunizations.   Do not use any tobacco products including cigarettes, chewing tobacco, or electronic cigarettes.  If you are pregnant, do not drink alcohol.  If you are breastfeeding, limit how much and how often you drink alcohol.  Limit  alcohol intake to no more than 1 drink per day for nonpregnant women. One drink equals 12 ounces of beer, 5 ounces of wine, or 1 ounces of hard liquor.  Do not use street drugs.  Do not share needles.  Ask your health care provider for help if you need support or information about quitting drugs.  Tell your health care provider if you often feel depressed.  Tell your health care provider if you have ever been abused or do not feel safe at home.   This information is not intended to replace advice given to you by your health care provider. Make sure you discuss any questions you have with your health care provider.   Document Released: 01/08/2011 Document Revised: 07/16/2014 Document Reviewed: 05/27/2013 Elsevier Interactive Patient Education [employer].

## 2015-09-23 NOTE — Progress Notes (Signed)
Pre visit review using our clinic review tool, if applicable. No additional management support is needed unless otherwise documented below in the visit note. 

## 2015-09-23 NOTE — Assessment & Plan Note (Signed)
BP at goal on hctz 12.5 mg daily, checking CMP today.

## 2015-09-23 NOTE — Progress Notes (Signed)
   Subjective:    Patient ID: Shannon Yu, female    DOB: 1956/01/14, 60 y.o.   MRN: [id number]  HPI The patient is a 60 YO female coming in for wellness. No new concerns, her ob/gyn just started her on a blood pressure medicine hctz. No side effects.   PMH, [facility], social history reviewed and updated.   Review of Systems  Constitutional: Negative for fever, activity change, appetite change, fatigue and unexpected weight change.  HENT: Negative.   Eyes: Negative.   Respiratory: Negative for cough, chest tightness, shortness of breath and wheezing.   Cardiovascular: Negative for chest pain, palpitations and leg swelling.  Gastrointestinal: Negative for nausea, abdominal pain, diarrhea, constipation and abdominal distention.  Musculoskeletal: Negative.   Skin: Negative.   Neurological: Negative.   Psychiatric/Behavioral: Negative.       Objective:   Physical Exam  Constitutional: She is oriented to person, place, and time. She appears well-developed and well-nourished.  HENT:  Head: Normocephalic and atraumatic.  Eyes: EOM are normal.  Neck: Normal range of motion.  Cardiovascular: Normal rate and regular rhythm.   Pulmonary/Chest: Effort normal and breath sounds normal. No respiratory distress. She has no wheezes.  Abdominal: Soft. Bowel sounds are normal. She exhibits no distension. There is no tenderness. There is no rebound.  Musculoskeletal: She exhibits no edema.  Neurological: She is alert and oriented to person, place, and time. Coordination normal.  Skin: Skin is warm and dry.  Psychiatric: She has a normal mood and affect.   Filed Vitals:   09/23/15 1459  BP: 110/68  Pulse: 84  Temp: 98.6 F (37 C)  TempSrc: Oral  Resp: 12  Height: 5\' 8"  (1.727 m)  Weight: 183 lb (83.008 kg)  SpO2: 98%      Assessment & Plan:

## 2015-09-27 ENCOUNTER — Ambulatory Visit: Payer: Commercial Managed Care - PPO | Admitting: Internal Medicine

## 2015-09-29 ENCOUNTER — Encounter: Payer: Self-pay | Admitting: Internal Medicine

## 2015-09-29 ENCOUNTER — Ambulatory Visit (INDEPENDENT_AMBULATORY_CARE_PROVIDER_SITE_OTHER): Payer: Commercial Managed Care - PPO | Admitting: Internal Medicine

## 2015-09-29 VITALS — BP 112/68 | HR 84 | Ht 68.0 in | Wt 182.4 lb

## 2015-09-29 DIAGNOSIS — J309 Allergic rhinitis, unspecified: Secondary | ICD-10-CM | POA: Diagnosis not present

## 2015-09-29 DIAGNOSIS — J3089 Other allergic rhinitis: Secondary | ICD-10-CM

## 2015-09-29 DIAGNOSIS — G4733 Obstructive sleep apnea (adult) (pediatric): Secondary | ICD-10-CM | POA: Diagnosis not present

## 2015-09-29 DIAGNOSIS — J302 Other seasonal allergic rhinitis: Secondary | ICD-10-CM

## 2015-09-29 MED ORDER — AZELASTINE-FLUTICASONE 137-50 MCG/ACT NA SUSP: 1.0000 | Freq: Every day | NASAL | Status: DC

## 2015-09-29 MED ORDER — ZOLPIDEM TARTRATE 10 MG PO TABS: 10.0000 mg | ORAL_TABLET | Freq: Every evening | ORAL | Status: DC | PRN

## 2015-09-29 NOTE — Progress Notes (Signed)
06/26/11- 56 yoFnever smoker seeking evaluation for sleep apnea. Boyfriend describes loud snore and witnessed apnea. She feels sleep quality is poor and unrestful since menopause at age 60. Tylenol PM helps her fall asleep. She had been evaluated here in the past for allergic rhinitis. Nasal congestion contributes to her snoring. She avoids naps. One cup of coffee in the morning only. Bedtime between 8 and 10 PM based on when she plays tennis. Short sleep latency, waking once before up between 5:30 and 5:45 AM. Weight is up about 8 pounds in the last 2 years. No ENT surgery. No history of cardiopulmonary disease. Brother uses CPAP for sleep apnea.  07/25/11-  28 yoFnever smoker followed for OSA, allergic rhinitis NPSG 07/06/11- Mild OSA AHI 7.8/ hr, nonpositional, loud snoring, desat to 83%.   we discussed obstructive sleep apnea in this range , medical significance and spectrum of available treatments. She is not much concerned and that is appropriate. Medical impact is unlikely to be much as long as she doesn't gain weight. We agreed not to treat at this time.  11/21/12- 2 yoFnever smoker followed for OSA, allergic rhinitis, hx CVA/ aneurysm FOLLOWS FOR: not currently using CPAP machine. Had a ruptured brain anyurseum in 05/2012. Feels like she is extremely tired since that happened. Blames upset about sister in law died of lung cancer for stress leading to ruptured brain aneurysm-coiled. Since then stays tired, had to stop exercise program. Wants to try CPAP to regain energy.  7/101/4- 56 yoFnever smoker followed for OSA, allergic rhinitis, hx CVA/ aneurysm follows for:  currently using the CPAP13/ [facility]  nightly.  no problems.  She likes her CPAP. Comfort discussed. Less tired.  07/24/14- 46 yoFnever smoker followed for OSA, allergic rhinitis, hx CVA/ aneurysm FOLLOWS FOR: wears CPAP 13 nightly; DME is [facility]  Feels well controlled. Nasal pillows mask. No problems with nasal irritation  now  09/23/14- 57 yoFnever smoker followed for OSA, allergic rhinitis,  hx aneurysm/CVA/ aneurysm, allergic rhinitis FOLLOWS FOR: wears CPAP 13 nightly; DME [facility]  She is doing very well with CPAP except she feels she is swallowing too much air. We discussed reducing pressure to 12 for trial.  09/29/2015-60 year old female never smoker followed for OSA, allergic rhinitis, complicated by brain aneurysms/SAH CPAP 12/[facility] FOLLOWS FOR: DME [facility]; Pt states she wears CPAP every night for about 8 hours. No new supplies needed at this time.  She has no concerns or comfort issues. We discussed general management and goals. Sleep quality remains better with CPAP and she is motivated by desire to avoid cardiovascular problems. Mild seasonal pollen rhinitis. We discussed management.  ROS-see HPI Constitutional:   No-   weight loss, night sweats, fevers, chills, fatigue, lassitude. HEENT:   No-  headaches, difficulty swallowing, tooth/dental problems, sore throat,       Some  sneezing, itching, ear ache, nasal congestion, post nasal drip,  CV:  No-   chest pain, orthopnea, PND, swelling in lower extremities, anasarca, dizziness, palpitations Resp: No-   shortness of breath with exertion or at rest.              No-   productive cough,  No non-productive cough,  No- coughing up of blood,   change in color of mucus.  No- wheezing.   Skin: No-   rash or lesions. GI:  No-   heartburn, indigestion, abdominal pain, nausea, vomiting,  GU: MS:  Neuro-     nothing unusual Psych:  No- change in mood or  affect. No depression or anxiety.  No memory loss.  OBJ General- Alert, Oriented, Affect-appropriate, Distress- none acute, tall Skin- rash-none, lesions- none, excoriation- none Lymphadenopathy- none Head- atraumatic            Eyes- Gross vision intact, PERRLA, conjunctivae clear secretions            Ears- Hearing, canals-normal            Nose- Clear, no-Septal dev, mucus, polyps, erosion, perforation              Throat- Mallampati II-III , mucosa clear , drainage- none, tonsils- atrophic Neck- flexible , trachea midline, no stridor , thyroid nl, carotid no bruit Chest - symmetrical excursion , unlabored           Heart/CV- RRR , no murmur , no gallop  , no rub, nl s1 s2                           - JVD- none , edema- none, stasis changes- none, varices- none           Lung- clear to P&A, wheeze- none, cough- none , dullness-none, rub- none           Chest wall-  Abd- Br/ Gen/ Rectal- Not done, not indicated Extrem- cyanosis- none, clubbing, none, atrophy- none, strength- nl Neuro- grossly intact to observation

## 2015-09-29 NOTE — Patient Instructions (Addendum)
Script refilling ambien 10 mg---     Try 1/2 or 1 at bedtime occasionally if needed  Sample and script for Dymista nasal spray   Try 1-2 puffs each nostril at bedtime while needed  We can continue CPAP 12/ [facility].  You can ask [facility] when you might be eligible for a replacement machine  Order DME [facility]- download CPAP for pressure  compliance

## 2015-09-30 NOTE — Assessment & Plan Note (Signed)
OTC antihistamines and nasal sprays should be sufficient

## 2015-09-30 NOTE — Assessment & Plan Note (Signed)
Good compliance and control now at 12 CWP/[facility]. We discussed comfort issues and eventual replacement of machine.

## 2015-10-07 ENCOUNTER — Telehealth: Payer: Self-pay | Admitting: Internal Medicine

## 2015-10-07 NOTE — Telephone Encounter (Signed)
PA initiated through [facility] for Dymista Alternatives requested by insurance:  Flunisolide, Fluticasone, Azelastine, Qnasl  PA will be determined within 24-72 hours through [facility] Key: [id number]

## 2015-10-10 NOTE — Telephone Encounter (Signed)
PA still pending.  

## 2015-10-12 NOTE — Telephone Encounter (Signed)
PA has been denied for Dymista:   [id number] Name:FCR (PST): Nasal Steroids (Beconase AQ,Dymista, Flonase, Flonase OTC, Child Flonase OTC, Nasacort OTC, Child Nasacort OTC, Nasacort AQ, Nasonex,Omnaris, Rhinocort Aqua, Rhinocort OTC, Veramyst, Zetonna) - ESI;Status:Denied;Appeal Information: Attention:ATTN: [place] [id number]. [id number]

## 2015-10-13 MED ORDER — AZELASTINE HCL 0.1 % NA SOLN: NASAL | Status: DC

## 2015-10-13 NOTE — Telephone Encounter (Signed)
LMTC x `1 for pt 

## 2015-10-13 NOTE — Telephone Encounter (Signed)
D/C Dymista. Instead offer combination of Flonase otc 1-2 puffs each nostril onec daily at bedtime                                                                 And Rx azelastine nasal spray,  # 1,  1-2 puffs each nostril once daily at bedtime   Ref x 12

## 2015-10-13 NOTE — Telephone Encounter (Signed)
Spoke with pt and advised of Dr Young's recommendations.  Pt verbalized understanding.  Rx sent to pharmacy 

## 2016-04-27 ENCOUNTER — Other Ambulatory Visit: Payer: Self-pay | Admitting: Internal Medicine

## 2016-04-27 MED ORDER — ZOLPIDEM TARTRATE 10 MG PO TABS: 10.0000 mg | ORAL_TABLET | Freq: Every evening | ORAL | 5 refills | Status: DC | PRN

## 2016-04-27 NOTE — Telephone Encounter (Signed)
Received faxed refill request for Ambien 10mg  Per CY: okay for Ambien 10mg , 1 tab at bedtime as needed for sleep #30 with 5 refills Called [facility], spoke with pharmacist Colletta [place] and verbal order given for the above Rx Med list updated Will sign off

## 2016-08-27 ENCOUNTER — Other Ambulatory Visit: Payer: Self-pay | Admitting: Specialist

## 2016-08-27 DIAGNOSIS — Z1231 Encounter for screening mammogram for malignant neoplasm of breast: Secondary | ICD-10-CM

## 2016-09-13 ENCOUNTER — Ambulatory Visit
Admission: RE | Admit: 2016-09-13 | Discharge: 2016-09-13 | Disposition: A | Discharge status: Home / Self Care | Payer: Commercial Managed Care - PPO | Source: Ambulatory Visit | Attending: Specialist | Admitting: Specialist

## 2016-09-13 DIAGNOSIS — Z1231 Encounter for screening mammogram for malignant neoplasm of breast: Secondary | ICD-10-CM

## 2016-11-04 ENCOUNTER — Other Ambulatory Visit: Payer: Self-pay | Admitting: Internal Medicine

## 2016-11-04 ENCOUNTER — Encounter: Payer: Self-pay | Admitting: Internal Medicine

## 2016-11-05 ENCOUNTER — Encounter: Payer: Self-pay | Admitting: Internal Medicine

## 2016-11-05 ENCOUNTER — Ambulatory Visit (INDEPENDENT_AMBULATORY_CARE_PROVIDER_SITE_OTHER): Payer: Commercial Managed Care - PPO | Admitting: Internal Medicine

## 2016-11-05 DIAGNOSIS — J302 Other seasonal allergic rhinitis: Secondary | ICD-10-CM | POA: Diagnosis not present

## 2016-11-05 DIAGNOSIS — J3089 Other allergic rhinitis: Secondary | ICD-10-CM

## 2016-11-05 DIAGNOSIS — G4733 Obstructive sleep apnea (adult) (pediatric): Secondary | ICD-10-CM | POA: Diagnosis not present

## 2016-11-05 MED ORDER — ZOLPIDEM TARTRATE 10 MG PO TABS
10.0000 mg | ORAL_TABLET | Freq: Every evening | ORAL | 5 refills | Status: DC | PRN
Start: 2016-11-05 — End: 2017-05-12

## 2016-11-05 NOTE — Patient Instructions (Signed)
Script printed for [facility] refill  We can continue CPAP 12, mask of choice, humidifier, supplies, AirView   Dx OSA  You can ask [facility] when you might be eligible for a replacement for your old CPAP machine

## 2016-11-05 NOTE — Progress Notes (Signed)
HPI female never smoker followed for OSA, allergic rhinitis, complicated by brain aneurysms/SAH NPSG 07/06/11- Mild OSA AHI 7.8/ hr, nonpositional, loud snoring, desat to 83%.   ----------------------------------------------------------------------------------  09/23/14- 57 yoFnever smoker followed for OSA, allergic rhinitis,  hx aneurysm/CVA/ aneurysm, allergic rhinitis FOLLOWS FOR: wears CPAP 13 nightly; DME [facility]  She is doing very well with CPAP except she feels she is swallowing too much air. We discussed reducing pressure to 12 for trial.  09/29/2015-61 year old female never smoker followed for OSA, allergic rhinitis, complicated by brain aneurysms/SAH CPAP 12/[facility] FOLLOWS FOR: DME [facility]; Pt states she wears CPAP every night for about 8 hours. No new supplies needed at this time.  She has no concerns or comfort issues. We discussed general management and goals. Sleep quality remains better with CPAP and she is motivated by desire to avoid cardiovascular problems. Mild seasonal pollen rhinitis. We discussed management.  11/05/16- 61 year old female never smoker followed for OSA, allergic rhinitis, complicated by brain aneurysms/SAH CPAP 12/[facility] follow up for her OSA, patient is doing very well on her cpap machine She feels she is benefiting without question and describes excellent compliance and control. Download confirms. Typical increased rhinitis this time of year for her.  ROS-see HPI Constitutional:   No-   weight loss, night sweats, fevers, chills, fatigue, lassitude. HEENT:   No-  headaches, difficulty swallowing, tooth/dental problems, sore throat,       Some  sneezing, itching, ear ache, nasal congestion, post nasal drip,  CV:  No-   chest pain, orthopnea, PND, swelling in lower extremities, anasarca, dizziness, palpitations Resp: No-   shortness of breath with exertion or at rest.              No-   productive cough,  No non-productive cough,  No- coughing up of blood,    change in color of mucus.  No- wheezing.   Skin: No-   rash or lesions. GI:  No-   heartburn, indigestion, abdominal pain, nausea, vomiting,  GU: MS:  Neuro-     nothing unusual Psych:  No- change in mood or affect. No depression or anxiety.  No memory loss.  OBJ              stable baseline exam General- Alert, Oriented, Affect-appropriate, Distress- none acute, tall Skin- rash-none, lesions- none, excoriation- none Lymphadenopathy- none Head- atraumatic            Eyes- Gross vision intact, PERRLA, conjunctivae clear secretions            Ears- Hearing, canals-normal            Nose- Clear, no-Septal dev, mucus, polyps, erosion, perforation             Throat- Mallampati II-III , mucosa clear , drainage- none, tonsils- atrophic Neck- flexible , trachea midline, no stridor , thyroid nl, carotid no bruit Chest - symmetrical excursion , unlabored           Heart/CV- RRR , no murmur , no gallop  , no rub, nl s1 s2                           - JVD- none , edema- none, stasis changes- none, varices- none           Lung- clear to P&A, wheeze- none, cough- none , dullness-none, rub- none           Chest wall-  Abd- Br/ Gen/ Rectal-  Not done, not indicated Extrem- cyanosis- none, clubbing, none, atrophy- none, strength- nl Neuro- grossly intact to observation

## 2016-11-07 NOTE — Assessment & Plan Note (Signed)
She is doing very well with CPAP and certainly feels she benefits from using it. No concerns. Plan-continue CPAP 12

## 2016-11-07 NOTE — Assessment & Plan Note (Signed)
Increasing seasonal rhinitis due to tree pollens at this time of year. Plan-recommended nonsedating antihistamine like Claritin plus nasal steroid like Flonase

## 2017-03-14 ENCOUNTER — Other Ambulatory Visit (HOSPITAL_COMMUNITY): Payer: Self-pay | Admitting: Interventional Radiology

## 2017-04-22 ENCOUNTER — Encounter: Payer: Self-pay | Admitting: Gastroenterology

## 2017-05-12 ENCOUNTER — Other Ambulatory Visit: Payer: Self-pay | Admitting: Internal Medicine

## 2017-05-13 ENCOUNTER — Telehealth (HOSPITAL_COMMUNITY): Payer: Self-pay

## 2017-05-13 NOTE — Telephone Encounter (Signed)
CY Please advise on refill. Thanks.  

## 2017-05-13 NOTE — Telephone Encounter (Signed)
Called to schedule 2 yr f/u mri, left message for pt to return call. AW

## 2017-05-13 NOTE — Telephone Encounter (Signed)
Ok to refill 6 months 

## 2017-05-17 ENCOUNTER — Telehealth (HOSPITAL_COMMUNITY): Payer: Self-pay

## 2017-05-17 NOTE — Telephone Encounter (Signed)
Called to schedule mri, left message for pt to return call. AW 

## 2017-05-21 ENCOUNTER — Other Ambulatory Visit (HOSPITAL_COMMUNITY): Payer: Self-pay | Admitting: Interventional Radiology

## 2017-05-21 DIAGNOSIS — R42 Dizziness and giddiness: Secondary | ICD-10-CM

## 2017-05-21 DIAGNOSIS — I729 Aneurysm of unspecified site: Secondary | ICD-10-CM

## 2017-05-21 DIAGNOSIS — H538 Other visual disturbances: Secondary | ICD-10-CM

## 2017-06-05 ENCOUNTER — Ambulatory Visit (HOSPITAL_COMMUNITY)
Admission: RE | Admit: 2017-06-05 | Discharge: 2017-06-05 | Disposition: A | Discharge status: Home / Self Care | Payer: Commercial Managed Care - PPO | Source: Ambulatory Visit | Attending: Interventional Radiology | Admitting: Interventional Radiology

## 2017-06-05 ENCOUNTER — Ambulatory Visit (HOSPITAL_COMMUNITY): Payer: Commercial Managed Care - PPO

## 2017-06-05 DIAGNOSIS — Z9889 Other specified postprocedural states: Secondary | ICD-10-CM | POA: Insufficient documentation

## 2017-06-05 DIAGNOSIS — H538 Other visual disturbances: Secondary | ICD-10-CM | POA: Insufficient documentation

## 2017-06-05 DIAGNOSIS — I729 Aneurysm of unspecified site: Secondary | ICD-10-CM | POA: Diagnosis present

## 2017-06-05 DIAGNOSIS — I668 Occlusion and stenosis of other cerebral arteries: Secondary | ICD-10-CM | POA: Diagnosis not present

## 2017-06-05 DIAGNOSIS — R42 Dizziness and giddiness: Secondary | ICD-10-CM

## 2017-06-05 DIAGNOSIS — I671 Cerebral aneurysm, nonruptured: Secondary | ICD-10-CM | POA: Diagnosis not present

## 2017-06-05 LAB — CREATININE, SERUM
CREATININE: 0.96 mg/dL (ref 0.44–1.00)
GFR calc non Af Amer: >60 mL/min (ref >60)

## 2017-06-05 MED ORDER — GADOBENATE DIMEGLUMINE 529 MG/ML IV SOLN
20.0000 mL | Freq: Once | INTRAVENOUS | Status: AC
  Administered 2017-06-05: 17 mL via INTRAVENOUS

## 2017-06-07 ENCOUNTER — Telehealth (HOSPITAL_COMMUNITY): Payer: Self-pay

## 2017-06-07 NOTE — Telephone Encounter (Signed)
Called regarding pt's recent mri, left message for pt to return call. AW

## 2017-06-11 ENCOUNTER — Telehealth (HOSPITAL_COMMUNITY): Payer: Self-pay

## 2017-06-11 NOTE — Telephone Encounter (Signed)
Called, left vm. AW  

## 2017-06-11 NOTE — Telephone Encounter (Signed)
Pt agreed to f/u in 2 years with an mri/mra per Dr. Estanislado Pandy. AW

## 2017-06-25 ENCOUNTER — Encounter: Payer: Self-pay | Admitting: Gastroenterology

## 2017-06-25 ENCOUNTER — Ambulatory Visit: Payer: Commercial Managed Care - PPO | Admitting: Gastroenterology

## 2017-06-25 VITALS — BP 118/72 | HR 80 | Ht 67.25 in | Wt 185.2 lb

## 2017-06-25 DIAGNOSIS — R1310 Dysphagia, unspecified: Secondary | ICD-10-CM

## 2017-06-25 DIAGNOSIS — K219 Gastro-esophageal reflux disease without esophagitis: Secondary | ICD-10-CM | POA: Diagnosis not present

## 2017-06-25 MED ORDER — OMEPRAZOLE 40 MG PO CPDR: 40.0000 mg | DELAYED_RELEASE_CAPSULE | Freq: Every day | ORAL | 1 refills | Status: DC

## 2017-06-25 NOTE — Progress Notes (Signed)
HPI :  61 y/o female with a remote history of cervical cancer, history of CVA, OSA, here for a new patient visit referred by Dr. Jac Canavan for dysphagia. Previously seen by Dr. Olevia Perches in 2012.  She reports symptoms of dysphagia ongoing for years. She endorses dysphagia with solids only, usually occurs in the mid chest. She denies any liquid dysphagia and drinks liquids to help push the food down. She denies any history of food impactions. She has not had to vomit up any food has not passed. She doesn't have routine heartburn but does endorse feeling "acid reflux" in her chest at times. She denies any upper abdominal pains. She denies any history of esophageal cancer in the family. She is a prior tobacco user for about 15 years. She's never had a prior endoscopy. She takes a regular aspirin daily however no other blood thinners. Prior colonoscopy in 2012 showed 1 diminutive leiomyoma, told to recall in 10 years.  Labs 04/04/17 - normal LFTs, Hgb 14/0, MCV 96, plt 234  Colonoscopy 07/2010 - diverticulosis right colon, 1 small polyp c/w leiomyoma  Past Medical History:  Diagnosis Date  . Brain aneurysm 05/09/12   coil/stent, angio with stent 02/2014  . Cervical cancer ([place])   . Diverticulosis   . Environmental allergies   . History of shingles   . HTN (hypertension)   . Menopause   . Migraine   . Osteoarthritis    left knee  . Personal history of colonic polyps    noted on 2012 colonoscopy  . Sleep apnea 06/2011 dx   CPAP 13/[facility]  . Stroke (cerebrum) ([place])   . Vertigo      Past Surgical History:  Procedure Laterality Date  . ANEURYSM COILING  05/09/2012   middle  . Brain stent  2013, 2015  . BREAST BIOPSY Right 1988   BENIGN  . BREAST BIOPSY Left 1993   BENIGN  . KNEE ARTHROSCOPY Left   . RADIOLOGY WITH ANESTHESIA N/A 09/03/2012   Procedure: RADIOLOGY WITH ANESTHESIA;  Surgeon: Rob Hickman, MD;  Location: [facility];  Service: Radiology;  Laterality: N/A;  aneurysm  embolization   . RADIOLOGY WITH ANESTHESIA N/A 02/10/2014   Procedure: RADIOLOGY WITH ANESTHESIA, emobolization;  Surgeon: Rob Hickman, MD;  Location: [facility];  Service: Radiology;  Laterality: N/A;   Family History  Problem Relation Age of Onset  . Emphysema Father        smoker  . Lung cancer Father 3  . Prostate cancer Father   . Kidney cancer Father   . Scleroderma Mother 49  . Heart attack Maternal Grandmother    Social History   Tobacco Use  . Smoking status: Former Smoker    Packs/day: 0.25    Years: 10.00    Pack years: 2.50    Types: Cigarettes  . Smokeless tobacco: Never Used  Substance Use Topics  . Alcohol use: Yes    Alcohol/week: 0.0 oz    Comment: 1-2 drinks 3-5 times a week  . Drug use: No   Current Outpatient Medications  Medication Sig Dispense Refill  . aspirin 325 MG tablet Take 325 mg by mouth daily.    . cetirizine (ZYRTEC) 10 MG tablet Take 10 mg by mouth daily.    . Glucosamine HCl (GLUCOSAMINE PO) Take 1 tablet by mouth daily. *Triple Strength*    . hydrochlorothiazide (HYDRODIURIL) 12.5 MG tablet TK 1 T PO QD  3  . JINTELI 1-5 MG-MCG TABS Take 1 tablet  by mouth daily.    Marland Kitchen loratadine (CLARITIN) 10 MG tablet Take 10 mg by mouth every morning.    . Omega-3 Fatty Acids (FISH OIL PO) Take 1 capsule by mouth daily.    Marland Kitchen VITAMIN D, CHOLECALCIFEROL, PO Take 25 mcg by mouth daily.    Marland Kitchen zolpidem (AMBIEN) 10 MG tablet TAKE 1 TABLET BY MOUTH AT BEDTIME AS NEEDED FOR SLEEP 30 tablet 5   No current facility-administered medications for this visit.    No Known Allergies   Review of Systems: All systems reviewed and negative except where noted in HPI.    Labs per HPI  Physical Exam: BP 118/72 (BP Location: Left Arm, Patient Position: Sitting, Cuff Size: Normal)   Pulse 80   Ht 5' 7.25" (1.708 m) Comment: height measured without shoes  Wt 84 kg (185 lb 4 oz)   BMI 28.80 kg/m  Constitutional: Pleasant,well-developed, female in no acute  distress. HEENT: Normocephalic and atraumatic. Conjunctivae are normal. No scleral icterus. Neck supple.  Cardiovascular: Normal rate, regular rhythm.  Pulmonary/chest: Effort normal and breath sounds normal. No wheezing, rales or rhonchi. Abdominal: Soft, nondistended, nontender.  There are no masses palpable. No hepatomegaly. Extremities: no edema Lymphadenopathy: No cervical adenopathy noted. Neurological: Alert and oriented to person place and time. Skin: Skin is warm and dry. No rashes noted. Psychiatric: Normal mood and affect. Behavior is normal.   ASSESSMENT AND PLAN: 61 year old female referred here for symptoms of dysphagia and intermittent reflux. I discussed the differential diagnosis for dysphagia with her, it's quite possible she has a peptic stricture or Schatzki's ring in the setting of reflux. I'm recommending an upper endoscopy to further evaluate, ensure no malignancy, and dilate any stricture if present. I discussed the risks and benefits of EGD with dilation and anesthesia with her, following this discussion she wanted to proceed with it. In the interim we'll give her a trial of omeprazole 40 mg once daily to see if this will help her reflux symptoms or dysphagia. Further recommendations pending the results of her endoscopy and her course.   Unicoi Cellar, MD [facility] Pager [phone]  CC: Glory Buff, MD

## 2017-06-25 NOTE — Patient Instructions (Addendum)
If you are age 60 or older, your body mass index should be between 23-30. Your Body mass index is 28.8 kg/m. If this is out of the aforementioned range listed, please consider follow up with your Primary Care Provider.  If you are age 93 or younger, your body mass index should be between 19-25. Your Body mass index is 28.8 kg/m. If this is out of the aformentioned range listed, please consider follow up with your Primary Care Provider.   You have been scheduled for an endoscopy. Please follow written instructions given to you at your visit today. If you use inhalers (even only as needed), please bring them with you on the day of your procedure. Your physician has requested that you go to www.startemmi.com and enter the access code given to you at your visit today. This web site gives a general overview about your procedure. However, you should still follow specific instructions given to you by our office regarding your preparation for the procedure.  We have sent the following medications to your pharmacy for you to pick up at your convenience: Omeprazole   Thank you for entrusting me with your care, Dr. Golva Cellar

## 2017-07-17 ENCOUNTER — Encounter: Payer: Commercial Managed Care - PPO | Admitting: Gastroenterology

## 2017-10-19 ENCOUNTER — Other Ambulatory Visit: Payer: Self-pay | Admitting: Gastroenterology

## 2017-10-24 ENCOUNTER — Other Ambulatory Visit: Payer: Self-pay | Admitting: Specialist

## 2017-10-24 DIAGNOSIS — Z1231 Encounter for screening mammogram for malignant neoplasm of breast: Secondary | ICD-10-CM

## 2017-11-05 ENCOUNTER — Encounter: Payer: Self-pay | Admitting: Internal Medicine

## 2017-11-05 ENCOUNTER — Ambulatory Visit: Payer: Commercial Managed Care - PPO | Admitting: Internal Medicine

## 2017-11-05 VITALS — BP 118/82 | Ht 67.25 in | Wt 186.2 lb

## 2017-11-05 DIAGNOSIS — F5101 Primary insomnia: Secondary | ICD-10-CM | POA: Diagnosis not present

## 2017-11-05 DIAGNOSIS — G4733 Obstructive sleep apnea (adult) (pediatric): Secondary | ICD-10-CM | POA: Diagnosis not present

## 2017-11-05 DIAGNOSIS — J302 Other seasonal allergic rhinitis: Secondary | ICD-10-CM

## 2017-11-05 DIAGNOSIS — G4700 Insomnia, unspecified: Secondary | ICD-10-CM | POA: Insufficient documentation

## 2017-11-05 DIAGNOSIS — J3089 Other allergic rhinitis: Secondary | ICD-10-CM | POA: Diagnosis not present

## 2017-11-05 MED ORDER — ZOLPIDEM TARTRATE 5 MG PO TABS: ORAL_TABLET | ORAL | 5 refills | Status: DC

## 2017-11-05 NOTE — Assessment & Plan Note (Signed)
She continues to feel benefit from CPAP-definitely sleeps better. Plan-replace old machine, changing to AutoPap 10-15

## 2017-11-05 NOTE — Progress Notes (Signed)
HPI female former smoker followed for OSA, allergic rhinitis, complicated by brain aneurysms/SAH NPSG 07/06/11- Mild OSA AHI 7.8/ hr, nonpositional, loud snoring, desat to 83%.   ----------------------------------------------------------------------------------.  11/05/16- 62 year old female never smoker followed for OSA, allergic rhinitis, complicated by brain aneurysms/SAH CPAP 12/[facility] follow up for her OSA, patient is doing very well on her cpap machine She feels she is benefiting without question and describes excellent compliance and control. Download confirms. Typical increased rhinitis this time of year for her.  11/05/2017- 62 year old female former smoker followed for OSA, allergic rhinitis, complicated by brain aneurysms/SAH CPAP 12/[facility] ----72yr f/u for OSA. Uses [facility] as her DME. Denies any issues with sleeping or machine.  No download for this visit-left card but uses CPAP all night every night "love that thing".  Definitely sleeps better with it.  No problems with her CPAP.  We think it is now at least 61 years old and I discussed change to AutoPap. Allergic rhinitis well controlled this spring using Flonase, Claritin/Zyrtec. Has been splitting her 10 mg ambiens and half to use less-discussed.  ROS-see HPI + = positive Constitutional:   No-   weight loss, night sweats, fevers, chills, fatigue, lassitude. HEENT:   No-  headaches, difficulty swallowing, tooth/dental problems, sore throat,       Some  sneezing, itching, ear ache, nasal congestion, post nasal drip,  CV:  No-   chest pain, orthopnea, PND, swelling in lower extremities, anasarca, dizziness, palpitations Resp: No-   shortness of breath with exertion or at rest.              No-   productive cough,  No non-productive cough,  No- coughing up of blood,   change in color of mucus.  No- wheezing.   Skin: No-   rash or lesions. GI:  No-   heartburn, indigestion, abdominal pain, nausea, vomiting,  GU: MS: Normal apparent  strength and muscle bulk Neuro-     nothing unusual Psych:  No- change in mood or affect. No depression or anxiety.  No memory loss.  OBJ           General- Alert, Oriented, Affect-appropriate/ friendly, Distress- none acute, tall Skin- rash-none, lesions- none, excoriation- none Lymphadenopathy- none Head- atraumatic            Eyes- Gross vision intact, PERRLA, conjunctivae clear secretions            Ears- Hearing, canals-normal            Nose- Clear, no-Septal dev, mucus, polyps, erosion, perforation             Throat- Mallampati II-III , mucosa clear , drainage- none, tonsils- atrophic Neck- flexible , trachea midline, no stridor , thyroid nl, carotid no bruit Chest - symmetrical excursion , unlabored           Heart/CV- RRR , no murmur , no gallop  , no rub, nl s1 s2                           - JVD- none , edema- none, stasis changes- none, varices- none           Lung- clear to P&A, wheeze- none, cough- none , dullness-none, rub- none           Chest wall-  Abd- Br/ Gen/ Rectal- Not done, not indicated Extrem- cyanosis- none, clubbing, none, atrophy- none, strength- nl Neuro- grossly intact to observation

## 2017-11-05 NOTE — Patient Instructions (Addendum)
Order- DME [facility]- please replace old CPAP machine - change to auto 10-15, mask of choice, humidifier, supplies, AirView  Script printed changing Ambien to 5 mg  Please call if we can help

## 2017-11-05 NOTE — Assessment & Plan Note (Signed)
Good control so far this year with Flonase and OTC antihistamine

## 2017-11-05 NOTE — Assessment & Plan Note (Signed)
Persistent issue.  Not sure if primary or related to her history of CVA.  She manages sleep habits very well.  I am pleased with her interested in reducing her Ambien. Plan-change Ambien prescription to 5 mg tabs.

## 2017-11-11 ENCOUNTER — Telehealth: Payer: Self-pay | Admitting: Internal Medicine

## 2017-11-11 NOTE — Telephone Encounter (Signed)
Called patient, unable to reach left message to give us a call back. 

## 2017-11-11 NOTE — Telephone Encounter (Signed)
[facility] can we see what we can do about this, thanks.

## 2017-11-11 NOTE — Telephone Encounter (Signed)
I ordered replacement of old CPAP machine at her last ov in April. Please see if that order didn't go through.

## 2017-11-11 NOTE — Telephone Encounter (Signed)
Spoke with patient and she states that CY recommended a new CPAP machine.   CY please advise do we need to place the order for this, thanks.

## 2017-11-12 NOTE — Telephone Encounter (Signed)
Spoke to [place] at [place].  She spoke to pt after the pt called [place].  Pt's 5 years will not be up until 6/4 & Tonya told the pt she would process the order on 6/5.  She verified pt is not needing supplies at this time & pt was fine with waiting until June for new cpap.  Nothing further needed.at this time.

## 2017-11-15 ENCOUNTER — Ambulatory Visit: Payer: Commercial Managed Care - PPO

## 2017-12-05 ENCOUNTER — Ambulatory Visit
Admission: RE | Admit: 2017-12-05 | Discharge: 2017-12-05 | Disposition: A | Discharge status: Home / Self Care | Payer: Commercial Managed Care - PPO | Source: Ambulatory Visit | Attending: Specialist | Admitting: Specialist

## 2017-12-05 DIAGNOSIS — Z1231 Encounter for screening mammogram for malignant neoplasm of breast: Secondary | ICD-10-CM

## 2018-01-17 ENCOUNTER — Encounter: Payer: Self-pay | Admitting: Internal Medicine

## 2018-01-17 DIAGNOSIS — G4733 Obstructive sleep apnea (adult) (pediatric): Secondary | ICD-10-CM

## 2018-01-17 NOTE — Telephone Encounter (Signed)
Order- DME [facility]- please replace old CPAP machine. Change to auto 10-15, mask of choice, humidifier, supplies, AirView   Dx OSA     Patient is compliant and continues to benefit with no break in therapy.

## 2018-01-17 NOTE — Telephone Encounter (Signed)
Pt emailed CY today regarding below message Routed message to [place] for review.   CY please advise.  Pt is having a hard time with [facility] in getting a new C-PAP machine. Her insurance company has told her she can get one but for whatever reason [facility] says no. Her insurance company has refused to replace a machine that is not broken.  No Known Allergies Current Outpatient Medications on File Prior to Visit  Medication Sig Dispense Refill  . aspirin 325 MG tablet Take 325 mg by mouth daily.    . cetirizine (ZYRTEC) 10 MG tablet Take 10 mg by mouth daily.    . Glucosamine HCl (GLUCOSAMINE PO) Take 1 tablet by mouth daily. *Triple Strength*    . hydrochlorothiazide (HYDRODIURIL) 12.5 MG tablet TK 1 T PO QD  3  . JINTELI 1-5 MG-MCG TABS Take 1 tablet by mouth daily.    Marland Kitchen loratadine (CLARITIN) 10 MG tablet Take 10 mg by mouth every morning.    . Omega-3 Fatty Acids (FISH OIL PO) Take 1 capsule by mouth daily.    Marland Kitchen omeprazole (PRILOSEC) 40 MG capsule TAKE 1 CAPSULE(40 MG) BY MOUTH DAILY 90 capsule 1  . VITAMIN D, CHOLECALCIFEROL, PO Take 25 mcg by mouth daily.    Marland Kitchen zolpidem (AMBIEN) 5 MG tablet 1 at bedtime if needed for sleep 30 tablet 5   No current facility-administered medications on file prior to visit.

## 2018-01-17 NOTE — Addendum Note (Signed)
Addended by: [place] Dana C on: 01/17/2018 04:09 PM   Modules accepted: Orders

## 2018-01-20 ENCOUNTER — Telehealth: Payer: Self-pay | Admitting: Internal Medicine

## 2018-01-20 NOTE — Telephone Encounter (Signed)
Called and spoke with [place] from [place], she states that the patient is wanting to get a new CPAP machine. Insurance will not pay for this due to her older machine being fine. Kenney Houseman states that they have the order from [place] but there is nothing else they can do due to her insurance not covering this.   Called and spoke with patient, patient states that there is nothing wrong with her machine she is just wanting a new one. Called her insurance company and spoke with [place]. She states that because her machine is fine they will not pay for another machine. Patient has been made aware of this. Nothing further needed.

## 2018-04-28 ENCOUNTER — Other Ambulatory Visit: Payer: Self-pay | Admitting: Gastroenterology

## 2018-05-24 ENCOUNTER — Other Ambulatory Visit: Payer: Self-pay | Admitting: Internal Medicine

## 2018-05-26 NOTE — Telephone Encounter (Signed)
Ok to refill total 6 months 

## 2018-05-26 NOTE — Telephone Encounter (Signed)
CY Please advise on refill. Thanks.  

## 2018-08-07 LAB — HM PAP SMEAR: HM Pap smear: HIGH

## 2018-08-08 LAB — LIPID PANEL
Cholesterol: 226 — AB (ref 0–200)
HDL: 53 (ref 35–70)
LDL Cholesterol: 159
Triglycerides: 68 (ref 40–160)

## 2018-08-08 LAB — TSH: TSH: 1.42 (ref <=5.90)

## 2018-08-08 LAB — BASIC METABOLIC PANEL
Glucose: 120
Potassium: 4 (ref 3.4–5.3)
Sodium: 141 (ref 137–147)

## 2018-08-08 LAB — HEPATIC FUNCTION PANEL
ALT: 30 (ref 7–35)
AST: 26 (ref 13–35)
Alkaline Phosphatase: 81 (ref 25–125)
Bilirubin, Total: 0.4

## 2018-08-08 LAB — CBC AND DIFFERENTIAL
HCT: 41 (ref 36–46)
Hemoglobin: 14.2 (ref 12.0–16.0)
Platelets: 227 (ref 150–399)
WBC: 5.8

## 2018-08-08 LAB — HEMOGLOBIN A1C: Hemoglobin A1C: 5.8

## 2018-08-11 LAB — HM PAP SMEAR: HM Pap smear: NORMAL

## 2018-11-06 ENCOUNTER — Ambulatory Visit: Payer: Commercial Managed Care - PPO | Admitting: Internal Medicine

## 2018-11-22 ENCOUNTER — Other Ambulatory Visit: Payer: Self-pay | Admitting: Internal Medicine

## 2018-11-24 NOTE — Telephone Encounter (Signed)
Is this refill appropriate?  

## 2018-11-24 NOTE — Telephone Encounter (Signed)
Zolpidem refill e-sent 

## 2019-01-16 ENCOUNTER — Other Ambulatory Visit: Payer: Self-pay | Admitting: Specialist

## 2019-01-16 DIAGNOSIS — Z1231 Encounter for screening mammogram for malignant neoplasm of breast: Secondary | ICD-10-CM

## 2019-02-16 ENCOUNTER — Ambulatory Visit: Payer: Self-pay

## 2019-02-24 ENCOUNTER — Ambulatory Visit: Payer: Commercial Managed Care - PPO | Admitting: Internal Medicine

## 2019-02-24 ENCOUNTER — Other Ambulatory Visit: Payer: Self-pay

## 2019-02-24 ENCOUNTER — Encounter: Payer: Self-pay | Admitting: Internal Medicine

## 2019-02-24 VITALS — BP 112/66 | HR 74 | Temp 97.9°F | Ht 67.0 in | Wt 177.8 lb

## 2019-02-24 DIAGNOSIS — F5101 Primary insomnia: Secondary | ICD-10-CM

## 2019-02-24 DIAGNOSIS — G4733 Obstructive sleep apnea (adult) (pediatric): Secondary | ICD-10-CM | POA: Diagnosis not present

## 2019-02-24 DIAGNOSIS — J302 Other seasonal allergic rhinitis: Secondary | ICD-10-CM | POA: Diagnosis not present

## 2019-02-24 DIAGNOSIS — J3089 Other allergic rhinitis: Secondary | ICD-10-CM

## 2019-02-24 NOTE — Progress Notes (Signed)
HPI female former smoker followed for OSA, allergic rhinitis, complicated by brain aneurysms/SAH NPSG 07/06/11- Mild OSA AHI 7.8/ hr, nonpositional, loud snoring, desat to 83%.   ----------------------------------------------------------------------------------. . 11/05/2017- 63 year old female former smoker followed for OSA, allergic rhinitis, complicated by brain aneurysms/SAH CPAP 12/[facility] ----18yr f/u for OSA. Uses [facility] as her DME. Denies any issues with sleeping or machine.  No download for this visit-left card but uses CPAP all night every night "love that thing".  Definitely sleeps better with it.  No problems with her CPAP.  We think it is now at least 62 years old and I discussed change to AutoPap. Allergic rhinitis well controlled this spring using Flonase, Claritin/Zyrtec. Has been splitting her 10 mg ambiens in half to use less-discussed.  02/24/2019- 63 year old female former smoker followed for OSA, allergic rhinitis, complicated by brain aneurysms/SAH CPAP 12/[facility]    OSA on CPAP  DME: [facility], no complaints, using every night "Funny noise" Body weight today 177 lbs Covid careful. Denies significant health changes.  ROS-see HPI + = positive Constitutional:   No-   weight loss, night sweats, fevers, chills, fatigue, lassitude. HEENT:   No-  headaches, difficulty swallowing, tooth/dental problems, sore throat,       Some  sneezing, itching, ear ache, nasal congestion, post nasal drip,  CV:  No-   chest pain, orthopnea, PND, swelling in lower extremities, anasarca, dizziness, palpitations Resp: No-   shortness of breath with exertion or at rest.              No-   productive cough,  No non-productive cough,  No- coughing up of blood,   change in color of mucus.  No- wheezing.   Skin: No-   rash or lesions. GI:  No-   heartburn, indigestion, abdominal pain, nausea, vomiting,  GU: MS: Normal apparent strength and muscle bulk Neuro-     nothing unusual Psych:  No- change in mood  or affect. No depression or anxiety.  No memory loss.  OBJ           General- Alert, Oriented, Affect-appropriate/ friendly, Distress- none acute, tall Skin- rash-none, lesions- none, excoriation- none Lymphadenopathy- none Head- atraumatic            Eyes- Gross vision intact, PERRLA, conjunctivae clear secretions            Ears- Hearing, canals-normal            Nose- Clear, no-Septal dev, mucus, polyps, erosion, perforation             Throat- Mallampati II-III , mucosa clear , drainage- none, tonsils- atrophic Neck- flexible , trachea midline, no stridor , thyroid nl, carotid no bruit Chest - symmetrical excursion , unlabored           Heart/CV- RRR , no murmur , no gallop  , no rub, nl s1 s2                           - JVD- none , edema- none, stasis changes- none, varices- none           Lung- clear to P&A, wheeze- none, cough- none , dullness-none, rub- none           Chest wall-  Abd- Br/ Gen/ Rectal- Not done, not indicated Extrem- cyanosis- none, clubbing, none, atrophy- none, strength- nl Neuro- grossly intact to observation

## 2019-02-24 NOTE — Assessment & Plan Note (Signed)
Continues to benefit from CPAP used every night. Discussed sound of machine Plan- replace old CPAP, changing to auto 5-15

## 2019-02-24 NOTE — Assessment & Plan Note (Addendum)
Primary insomnia controlled with 5 mg ambien when needed.Satisfied to continue. Suspect this may have been a complication of her remote SAH.

## 2019-02-24 NOTE — Patient Instructions (Addendum)
Order- DME [facility] please replace old, worn CPAP machine, change to auto 5-15, mask of choice, humidifier, supplies, Airview/ card  Please call if we can help

## 2019-03-03 ENCOUNTER — Ambulatory Visit
Admission: RE | Admit: 2019-03-03 | Discharge: 2019-03-03 | Disposition: A | Discharge status: Home / Self Care | Payer: Commercial Managed Care - PPO | Source: Ambulatory Visit | Attending: Specialist | Admitting: Specialist

## 2019-03-03 ENCOUNTER — Other Ambulatory Visit: Payer: Self-pay

## 2019-03-03 DIAGNOSIS — Z1231 Encounter for screening mammogram for malignant neoplasm of breast: Secondary | ICD-10-CM

## 2019-03-18 ENCOUNTER — Ambulatory Visit (INDEPENDENT_AMBULATORY_CARE_PROVIDER_SITE_OTHER): Payer: Commercial Managed Care - PPO | Admitting: Family Medicine

## 2019-03-18 ENCOUNTER — Encounter: Payer: Self-pay | Admitting: Family Medicine

## 2019-03-18 ENCOUNTER — Other Ambulatory Visit: Payer: Self-pay

## 2019-03-18 VITALS — BP 151/98 | HR 77 | Temp 98.6°F | Resp 18 | Ht 67.0 in | Wt 181.6 lb

## 2019-03-18 DIAGNOSIS — Z23 Encounter for immunization: Secondary | ICD-10-CM

## 2019-03-18 DIAGNOSIS — I10 Essential (primary) hypertension: Secondary | ICD-10-CM

## 2019-03-18 DIAGNOSIS — Z9989 Dependence on other enabling machines and devices: Secondary | ICD-10-CM

## 2019-03-18 DIAGNOSIS — R002 Palpitations: Secondary | ICD-10-CM

## 2019-03-18 DIAGNOSIS — G4733 Obstructive sleep apnea (adult) (pediatric): Secondary | ICD-10-CM | POA: Diagnosis not present

## 2019-03-18 DIAGNOSIS — Z0000 Encounter for general adult medical examination without abnormal findings: Secondary | ICD-10-CM

## 2019-03-18 DIAGNOSIS — I671 Cerebral aneurysm, nonruptured: Secondary | ICD-10-CM | POA: Diagnosis not present

## 2019-03-18 NOTE — Progress Notes (Signed)
New patient office visit note:  Impression and Recommendations:    1. HTN, goal below 130/80   2. OSA on CPAP   3. Brain aneurysm- 05/2012    4. Heart palpitations   5. Healthcare maintenance   6. Need for immunization against influenza    - Need for flu shot.  Encounter to Establish Care with New Doctor - Discussion held with patient regarding establishing as a new patient.  Discussed policies and practices here at the clinic, and answered all questions about care team and health management during appointment.  - Discussed need for patient to continue to obtain management and screenings with all established specialists.  Educated patient at length about the critical importance of keeping health maintenance up to date.  - Participated in lengthy conversation and all questions were answered.   Hypertension (h/o Brain Aneurysm) - Discussed the difference between hypertension and white coat syndrome. - Reviewed goal BP below 130/80 and need for further assessment.  - Ambulatory BP monitoring STRONGLY encouraged.  Keep log and bring in next OV.  - Lifestyle changes such as dash diet and engaging in a regular exercise program discussed with patient.  Educational handouts provided.  - Will continue to monitor.   History of Brain Aneurysm in 2013 - Advised patient to continue to obtain f/up and imaging as recommended by Dr. Estanislado Pandy. - Will continue to monitor.   Heart Palpitations - Patient will report back to clinic on any palpitations she experiences.  - Discussed potential causes of heart palpitations, including but not limited to caffeine use, excessive alcohol use, poor sleep, acute stress, use of antihistamines, dehydration, and the concurrence of all of these things at once  - Advised patient to cut back on caffeine use and alcohol.  Avoid additional antihistamines if possible.  Proper hydration discussed one half your weight in ounces water per day  -  Discussed need to assess labs to rule out abnormalities in electrolytes, thyroid, anemia etc  - Discussed red flag cardiac symptoms with patient today.  Patient knows to obtain emergency care if red flag symptoms emerge-of which she has never had any.  - Will continue to monitor.   OSA on CPAP - Discussed need for patient to continue obtaining appropriate management through specialist. - Continue on CPAP as prescribed. - Will continue to monitor.   Education and routine counseling performed. Handouts provided.  Recommendations - Need for lab work and CPE in near future.  - Per patient, last blood work obtained in June of 2019 - Patient knows to have all applicable records and labs sent to clinic from her GYN doc.   Orders Placed This Encounter  Procedures  . Flu Vaccine QUAD 36+ mos IM  . Comp Met (CMET)  . CBC w/Diff  . HgB A1c  . Lipid Panel With LDL/HDL Ratio  . TSH  . T4, free  . T3  . Vitamin D (25 hydroxy)  . Hepatitis C Antibody    Medications Discontinued During This Encounter  Medication Reason  . loratadine (CLARITIN) 10 MG tablet Error  . zolpidem (AMBIEN) 5 MG tablet Error     Gross side effects, risk and benefits, and alternatives of medications discussed with patient.  Patient is aware that all medications have potential side effects and we are unable to predict every side effect or drug-drug interaction that may occur.  Expresses verbal understanding and consents to current therapy plan and treatment regimen.  Return BP, heart palp, cut  back on ETOH/caffeine, for Office visit with me next available and 3 days prior fasting blood work.  Please see AVS handed out to patient at the end of our visit for further patient instructions/ counseling done pertaining to today's office visit.    Note:  This document was prepared using Dragon voice recognition software and may include unintentional dictation errors.  This document serves as a record of services  personally performed by Mellody Dance, DO. It was created on her behalf by Toni Amend, a trained medical scribe. The creation of this record is based on the scribe's personal observations and the provider's statements to them.   I have reviewed the above medical documentation for accuracy and completeness and I concur.  Mellody Dance, DO 03/18/2019 12:43 PM        ---------------------------------------------------------------------------------------------------------------------------------------------------------------------------------------------    Subjective:    Chief complaint:   Chief Complaint  Patient presents with  . Establish Care    has palpatations     HPI: Shannon Yu is a pleasant 63 y.o. female who presents to [place] at [facility] today to review their medical history with me and establish care.   I asked the patient to review their chronic problem list with me to ensure everything was updated and accurate.    All recent office visits with other providers, any medical records that patient brought in etc  - I reviewed today.     We asked pt to get [place] their medical records from [facility] providers/ specialists that they had seen within the past 3-5 years- if they are in private practice and/or do not work for [employer], [facility], [place], [place] or [employer] owned practice.  Told them to call their specialists to clarify this if they are not sure.    States formerly had a PCP but states "she got a new job."  Notes need a new PCP because her gynecologist noted her high blood pressure.  Social History Has been with boyfriend Tommy for 34 years. Konrad Dolores is 57 and continues working. Works as an Scientist, research (physical sciences).  Smoking History Quit smoking "years and years and years ago." Then picked it up again for a little while. Has has "absolutely nothing" since 2013.  Smoked from age 48 to early 35's, then quit for 10 years. Then began smoking  occasionally on weekends until 2013. "Now I have control."  Alcohol Use Says she's trying to control this. Says "I drink a lot." Prefers red wine. Says trying to "back off to one glass with dinner." Usually drinking about half a bottle.  Says her whole life has changed during the pandemic. Notes "working my ass off" and "had to re-learn her job."  Caffeine Use Thinks she drinks three cups in the morning, and doesn't drink anything else. Drinks "no tea at all."  Has one diet Dr. Malachi Bonds on the weekends.  Lifestyle Says "I used to do all the right things, and now I can't seem to control myself." Notes she has gained weight and is unable to resist making certain choices.  Past Medical History  Takes Zyrtec in the morning.  Indicates history of headaches/migraines, which she doesn't think were hormonal.  Notes used to exercise and still tries, but not as much anymore.  - Brain Aneurysm, Subarachnoid Hemorrhage  Notes history of brain aneurysm. This occurred back in 2013; notes never had a headache, "felt like menopause." Notes it affected her vision and that's how she found it. Says she was one  of "the 5% that walks away."  Dr. Estanislado Pandy is her neuroradiologist. Says she goes for brain MRI every 2-3 years. States that doctor wants her to follow up yearly, "but I don't."  Feels she's returned to her baseline. Notes now has two stents placed and "has a lot of people involved."  - Diagnosed with HTN about 4 years ago. She was placed on HCTZ 2 years ago by gynecology due to her high blood pressure. Says her gynecologist has continued to encouraged her to establish with a PCP. Was told by her gynecologist that he was "not doing this anymore" and she needed PCP.  She does not check her BP at home.  - Heart Palpitations Notes "I've been having heart palpitations that scare me."  Thinks this has started "a month ago maybe."  Says it feels like her heart is "fluttering" during  these events.  When she feels her heart is fluttering, denies other sx; says "no arms involved."  Says one day it happened after she was at work all day.  - Obstructive Sleep Apnea, Pulmonology f/up, CPAP Use Has had a sleep study done with Dr. Annamaria Boots in the past. Sleeps using a CPAP.  Says her blood pressure was also bad at her last visit to pulmonology.    Wt Readings from Last 3 Encounters:  03/18/19 181 lb 9.6 oz (82.4 kg)  02/24/19 177 lb 12.8 oz (80.6 kg)  11/05/17 186 lb 3.2 oz (84.5 kg)   BP Readings from Last 3 Encounters:  03/18/19 (!) 151/98  02/24/19 112/66  11/05/17 118/82   Pulse Readings from Last 3 Encounters:  03/18/19 77  02/24/19 74  06/25/17 80   BMI Readings from Last 3 Encounters:  03/18/19 28.44 kg/m  02/24/19 27.85 kg/m  11/05/17 28.95 kg/m    Patient Care Team    Relationship Specialty Notifications Start End  Mellody Dance, DO PCP - General Family Medicine  03/18/19   Glory Buff, MD  Gynecology  01/16/13   Luanne Bras, MD  Radiology  01/16/13   Leeroy Cha, MD  Neurosurgery  01/16/13   Lafayette Dragon, MD (Inactive)  Gastroenterology  01/16/13   Deneise Lever, MD  Sleep Medicine  01/16/13   Clent Jacks, MD  Ophthalmology  06/07/14     Patient Active Problem List   Diagnosis Date Noted  . HTN, goal below 130/80 03/18/2019  . OSA on CPAP 03/18/2019  . Heart palpitations 03/18/2019  . Insomnia 11/05/2017  . Routine general medical examination at a health care facility 09/23/2015  . Essential hypertension 09/23/2015  . Brain aneurysm 02/10/2014  . Osteoarthritis   . 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  . SAH (subarachnoid hemorrhage) ([place]) 05/10/2012  . Obstructive sleep apnea 06/30/2011  . Seasonal and perennial allergic rhinitis 11/22/2008       As reported by pt:  Past Medical History:  Diagnosis Date  . Brain aneurysm 05/09/12   coil/stent, angio with stent 02/2014  . Cervical cancer ([place])   .  Diverticulosis   . Environmental allergies   . History of shingles   . HTN (hypertension)   . Menopause   . Migraine   . Osteoarthritis    left knee  . Personal history of colonic polyps    noted on 2012 colonoscopy  . Sleep apnea 06/2011 dx   CPAP 13/apria  . Stroke (cerebrum) ([place])   . Vertigo      Past Surgical History:  Procedure Laterality Date  . ANEURYSM COILING  05/09/2012   middle  . Brain stent  2013, 2015  . BREAST BIOPSY Right 1988   BENIGN  . BREAST BIOPSY Left 1993   BENIGN  . KNEE ARTHROSCOPY Left   . RADIOLOGY WITH ANESTHESIA N/A 09/03/2012   Procedure: RADIOLOGY WITH ANESTHESIA;  Surgeon: Rob Hickman, MD;  Location: [facility];  Service: Radiology;  Laterality: N/A;  aneurysm embolization   . RADIOLOGY WITH ANESTHESIA N/A 02/10/2014   Procedure: RADIOLOGY WITH ANESTHESIA, emobolization;  Surgeon: Rob Hickman, MD;  Location: [facility];  Service: Radiology;  Laterality: N/A;     Family History  Problem Relation Age of Onset  . Emphysema Father        smoker  . Lung cancer Father 38  . Prostate cancer Father   . Kidney cancer Father   . Scleroderma Mother 24  . Heart attack Maternal Grandmother      Social History   Substance and Sexual Activity  Drug Use No     Social History   Substance and Sexual Activity  Alcohol Use Yes  . Alcohol/week: 0.0 standard drinks   Comment: 1-2 drinks 3-5 times a week     Social History   Tobacco Use  Smoking Status Former Smoker  . Packs/day: 0.25  . Years: 10.00  . Pack years: 2.50  . Types: Cigarettes  . Quit date: 07/10/2011  . Years since quitting: 7.6  Smokeless Tobacco Never Used     Current Meds  Medication Sig  . aspirin 325 MG tablet Take 325 mg by mouth daily.  . cetirizine (ZYRTEC) 10 MG tablet Take 10 mg by mouth daily.  . Glucosamine HCl (GLUCOSAMINE PO) Take 1 tablet by mouth daily. *Triple Strength*  . hydrochlorothiazide (HYDRODIURIL) 12.5 MG tablet TK 1 T PO QD  . Omega-3  Fatty Acids (FISH OIL PO) Take 1 capsule by mouth daily.  Marland Kitchen omeprazole (PRILOSEC) 40 MG capsule TAKE 1 CAPSULE(40 MG) BY MOUTH DAILY  . VITAMIN D, CHOLECALCIFEROL, PO Take 25 mcg by mouth daily.    Allergies: Patient has no known allergies.   Review of Systems  Constitutional: Negative for chills, diaphoresis, fever, malaise/fatigue and weight loss.  HENT: Negative for congestion, sore throat and tinnitus.   Eyes: Negative for blurred vision, double vision and photophobia.  Respiratory: Negative for cough and wheezing.   Cardiovascular: Positive for palpitations. Negative for chest pain.  Gastrointestinal: Negative for blood in stool, diarrhea, nausea and vomiting.  Genitourinary: Negative for dysuria, frequency and urgency.  Musculoskeletal: Negative for joint pain and myalgias.  Skin: Negative for itching and rash.  Neurological: Negative for dizziness, focal weakness, weakness and headaches.  Endo/Heme/Allergies: Negative for environmental allergies and polydipsia. Does not bruise/bleed easily.  Psychiatric/Behavioral: Negative for depression and memory loss. The patient is not nervous/anxious and does not have insomnia.         Objective:   Blood pressure (!) 151/98, pulse 77, temperature 98.6 F (37 C), resp. rate 18, height _0  (1.702 m), weight 181 lb 9.6 oz (82.4 kg), SpO2 98 %. Body mass index is 28.44 kg/m. General: Well Developed, well nourished, and in no acute distress.  Neuro: Alert and oriented x3, extra-ocular muscles intact, sensation grossly intact.  HEENT:[place]/AT, PERRLA, neck supple, No carotid bruits Skin: no gross rashes  Cardiac: Regular rate and rhythm Respiratory: Essentially clear to auscultation bilaterally. Not using accessory muscles, speaking in full sentences.  Abdominal: not grossly distended Musculoskeletal: Ambulates w/o diff, FROM * 4 ext.  Vasc: less  2 sec cap RF, warm and pink  Psych:  No HI/SI, judgement and insight good, Euthymic mood.  Full Affect.    No results found for this or any previous visit (from the past 2160 hour(s)).

## 2019-03-18 NOTE — Patient Instructions (Addendum)
Palpitations Palpitations are feelings that your heartbeat is irregular or is faster than normal. It may feel like your heart is fluttering or skipping a beat. Palpitations are usually not a serious problem. They may be caused by many things, including smoking, caffeine, alcohol, stress, and certain medicines or drugs. Most causes of palpitations are not serious. However, some palpitations can be a sign of a serious problem. You may need further tests to rule out serious medical problems. Follow these instructions at home:     Pay attention to any changes in your condition. Take these actions to help manage your symptoms: Eating and drinking  Avoid foods and drinks that may cause palpitations. These may include: ? Caffeinated coffee, tea, soft drinks, diet pills, and energy drinks. ? Chocolate. ? Alcohol. Lifestyle  Take steps to reduce your stress and anxiety. Things that can help you relax include: ? Yoga. ? Mind-body activities, such as deep breathing, meditation, or using words and images to create positive thoughts (guided imagery). ? Physical activity, such as swimming, jogging, or walking. Tell your health care provider if your palpitations increase with activity. If you have chest pain or shortness of breath with activity, do not continue the activity until you are seen by your health care provider. ? Biofeedback. This is a method that helps you learn to use your mind to control things in your body, such as your heartbeat.  Do not use drugs, including cocaine or ecstasy. Do not use marijuana.  Get plenty of rest and sleep. Keep a regular bed time. General instructions  Take over-the-counter and prescription medicines only as told by your health care provider.  Do not use any products that contain nicotine or tobacco, such as cigarettes and e-cigarettes. If you need help quitting, ask your health care provider.  Keep all follow-up visits as told by your health care provider.  This is important. These may include visits for further testing if palpitations do not go away or get worse. Contact a health care provider if you:  Continue to have a fast or irregular heartbeat after 24 hours.  Notice that your palpitations occur more often. Get help right away if you:  Have chest pain or shortness of breath.  Have a severe headache.  Feel dizzy or you faint. Summary  Palpitations are feelings that your heartbeat is irregular or is faster than normal. It may feel like your heart is fluttering or skipping a beat.  Palpitations may be caused by many things, including smoking, caffeine, alcohol, stress, certain medicines, and drugs.  Although most causes of palpitations are not serious, some causes can be a sign of a serious medical problem.  Get help right away if you faint or have chest pain, shortness of breath, a severe headache, or dizziness. This information is not intended to replace advice given to you by your health care provider. Make sure you discuss any questions you have with your health care provider. Document Released: 06/22/2000 Document Revised: 08/07/2017 Document Reviewed: 08/07/2017 Elsevier Patient Education  2020 [employer].   Your goal blood pressure should be 135/85 or less on a regular basis, her medications should be started.  Normal blood pressure is 120/80 or less.    Hypertension Hypertension, commonly called high blood pressure, is when the force of blood pumping through the arteries is too strong. The arteries are the blood vessels that carry blood from the heart throughout the body. Hypertension forces the heart to work harder to pump blood and may  cause arteries to become narrow or stiff. Having untreated or uncontrolled hypertension can cause heart attacks, strokes, kidney disease, and other problems. A blood pressure reading consists of a higher number over a lower number. Ideally, your blood pressure should be below 120/80. The  first ("top") number is called the systolic pressure. It is a measure of the pressure in your arteries as your heart beats. The second ("bottom") number is called the diastolic pressure. It is a measure of the pressure in your arteries as the heart relaxes. What are the causes? The cause of this condition is not known. What increases the risk? Some risk factors for high blood pressure are under your control. Others are not. Factors you can change  Smoking.  Having type 2 diabetes mellitus, high cholesterol, or both.  Not getting enough exercise or physical activity.  Being overweight.  Having too much fat, sugar, calories, or salt (sodium) in your diet.  Drinking too much alcohol. Factors that are difficult or impossible to change  Having chronic kidney disease.  Having a family history of high blood pressure.  Age. Risk increases with age.  Race. You may be at higher risk if you are African-American.  Gender. Men are at higher risk than women before age 48. After age 52, women are at higher risk than men.  Having obstructive sleep apnea.  Stress. What are the signs or symptoms? Extremely high blood pressure (hypertensive crisis) may cause:  Headache.  Anxiety.  Shortness of breath.  Nosebleed.  Nausea and vomiting.  Severe chest pain.  Jerky movements you cannot control (seizures).  How is this diagnosed? This condition is diagnosed by measuring your blood pressure while you are seated, with your arm resting on a surface. The cuff of the blood pressure monitor will be placed directly against the skin of your upper arm at the level of your heart. It should be measured at least twice using the same arm. Certain conditions can cause a difference in blood pressure between your right and left arms. Certain factors can cause blood pressure readings to be lower or higher than normal (elevated) for a short period of time:  When your blood pressure is higher when you are  in a health care provider's office than when you are at home, this is called white coat hypertension. Most people with this condition do not need medicines.  When your blood pressure is higher at home than when you are in a health care provider's office, this is called masked hypertension. Most people with this condition may need medicines to control blood pressure.  If you have a high blood pressure reading during one visit or you have normal blood pressure with other risk factors:  You may be asked to return on a different day to have your blood pressure checked again.  You may be asked to monitor your blood pressure at home for 1 week or longer.  If you are diagnosed with hypertension, you may have other blood or imaging tests to help your health care provider understand your overall risk for other conditions. How is this treated? This condition is treated by making healthy lifestyle changes, such as eating healthy foods, exercising more, and reducing your alcohol intake. Your health care provider may prescribe medicine if lifestyle changes are not enough to get your blood pressure under control, and if:  Your systolic blood pressure is above 130.  Your diastolic blood pressure is above 80.  Your personal target blood pressure may vary depending  on your medical conditions, your age, and other factors. Follow these instructions at home: Eating and drinking  Eat a diet that is high in fiber and potassium, and low in sodium, added sugar, and fat. An example eating plan is called the DASH (Dietary Approaches to Stop Hypertension) diet. To eat this way: ? Eat plenty of fresh fruits and vegetables. Try to fill half of your plate at each meal with fruits and vegetables. ? Eat whole grains, such as whole wheat pasta, brown rice, or whole grain bread. Fill about one quarter of your plate with whole grains. ? Eat or drink low-fat dairy products, such as skim milk or low-fat yogurt. ? Avoid fatty  cuts of meat, processed or cured meats, and poultry with skin. Fill about one quarter of your plate with lean proteins, such as fish, chicken without skin, beans, eggs, and tofu. ? Avoid premade and processed foods. These tend to be higher in sodium, added sugar, and fat.  Reduce your daily sodium intake. Most people with hypertension should eat less than 1,500 mg of sodium a day.  Limit alcohol intake to no more than 1 drink a day for nonpregnant women and 2 drinks a day for men. One drink equals 12 oz of beer, 5 oz of wine, or 1 oz of hard liquor. Lifestyle  Work with your health care provider to maintain a healthy body weight or to lose weight. Ask what an ideal weight is for you.  Get at least 30 minutes of exercise that causes your heart to beat faster (aerobic exercise) most days of the week. Activities may include walking, swimming, or biking.  Include exercise to strengthen your muscles (resistance exercise), such as pilates or lifting weights, as part of your weekly exercise routine. Try to do these types of exercises for 30 minutes at least 3 days a week.  Do not use any products that contain nicotine or tobacco, such as cigarettes and e-cigarettes. If you need help quitting, ask your health care provider.  Monitor your blood pressure at home as told by your health care provider.  Keep all follow-up visits as told by your health care provider. This is important. Medicines  Take over-the-counter and prescription medicines only as told by your health care provider. Follow directions carefully. Blood pressure medicines must be taken as prescribed.  Do not skip doses of blood pressure medicine. Doing this puts you at risk for problems and can make the medicine less effective.  Ask your health care provider about side effects or reactions to medicines that you should watch for. Contact a health care provider if:  You think you are having a reaction to a medicine you are  taking.  You have headaches that keep coming back (recurring).  You feel dizzy.  You have swelling in your ankles.  You have trouble with your vision. Get help right away if:  You develop a severe headache or confusion.  You have unusual weakness or numbness.  You feel faint.  You have severe pain in your chest or abdomen.  You vomit repeatedly.  You have trouble breathing. Summary  Hypertension is when the force of blood pumping through your arteries is too strong. If this condition is not controlled, it may put you at risk for serious complications.  Your personal target blood pressure may vary depending on your medical conditions, your age, and other factors. For most people, a normal blood pressure is less than 120/80.  Hypertension is treated with lifestyle changes,  medicines, or a combination of both. Lifestyle changes include weight loss, eating a healthy, low-sodium diet, exercising more, and limiting alcohol. This information is not intended to replace advice given to you by your health care provider. Make sure you discuss any questions you have with your health care provider. Document Released: 06/25/2005 Document Revised: 05/23/2016 Document Reviewed: 05/23/2016 Elsevier Interactive Patient Education  2018 [employer].    How to Take Your Blood Pressure   Blood pressure is a measurement of how strongly your blood is pressing against the walls of your arteries. Arteries are blood vessels that carry blood from your heart throughout your body. Your health care provider takes your blood pressure at each office visit. You can also take your own blood pressure at home with a blood pressure machine. You may need to take your own blood pressure:  To confirm a diagnosis of high blood pressure (hypertension).  To monitor your blood pressure over time.  To make sure your blood pressure medicine is working.  Supplies needed: To take your blood pressure, you will need a  blood pressure machine. You can buy a blood pressure machine, or blood pressure monitor, at most drugstores or online. There are several types of home blood pressure monitors. When choosing one, consider the following:  Choose a monitor that has an arm cuff.  Choose a monitor that wraps snugly around your upper arm. You should be able to fit only one finger between your arm and the cuff.  Do not choose a monitor that measures your blood pressure from your wrist or finger.  Your health care provider can suggest a reliable monitor that will meet your needs. How to prepare To get the most accurate reading, avoid the following for 30 minutes before you check your blood pressure:  Drinking caffeine.  Drinking alcohol.  Eating.  Smoking.  Exercising.  Five minutes before you check your blood pressure:  Empty your bladder.  Sit quietly without talking in a dining chair, rather than in a soft couch or armchair.  How to take your blood pressure To check your blood pressure, follow the instructions in the manual that came with your blood pressure monitor. If you have a digital blood pressure monitor, the instructions may be as follows: 1. Sit up straight. 2. Place your feet on the floor. Do not cross your ankles or legs. 3. Rest your left arm at the level of your heart on a table or desk or on the arm of a chair. 4. Pull up your shirt sleeve. 5. Wrap the blood pressure cuff around the upper part of your left arm, 1 inch (2.5 cm) above your elbow. It is best to wrap the cuff around bare skin. 6. Fit the cuff snugly around your arm. You should be able to place only one finger between the cuff and your arm. 7. Position the cord inside the groove of your elbow. 8. Press the power button. 9. Sit quietly while the cuff inflates and deflates. 10. Read the digital reading on the monitor screen and write it down (record it). 11. Wait 2-3 minutes, then repeat the steps, starting at step  1.  What does my blood pressure reading mean? A blood pressure reading consists of a higher number over a lower number. Ideally, your blood pressure should be below 120/80. The first ("top") number is called the systolic pressure. It is a measure of the pressure in your arteries as your heart beats. The second ("bottom") number is called the  diastolic pressure. It is a measure of the pressure in your arteries as the heart relaxes. Blood pressure is classified into four stages. The following are the stages for adults who do not have a short-term serious illness or a chronic condition. Systolic pressure and diastolic pressure are measured in a unit called mm Hg. Normal  Systolic pressure: below 123456.  Diastolic pressure: below 80. Elevated  Systolic pressure: [id number].  Diastolic pressure: below 80. Hypertension stage 1  Systolic pressure: 0000000.  Diastolic pressure: [id number]. Hypertension stage 2  Systolic pressure: [id number] or above.  Diastolic pressure: 90 or above. You can have prehypertension or hypertension even if only the systolic or only the diastolic number in your reading is higher than normal. Follow these instructions at home:  Check your blood pressure as often as recommended by your health care provider.  Take your monitor to the next appointment with your health care provider to make sure: ? That you are using it correctly. ? That it provides accurate readings.  Be sure you understand what your goal blood pressure numbers are.  Tell your health care provider if you are having any side effects from blood pressure medicine. Contact a health care provider if:  Your blood pressure is consistently high. Get help right away if:  Your systolic blood pressure is higher than 180.  Your diastolic blood pressure is higher than 110. This information is not intended to replace advice given to you by your health care provider. Make sure you discuss any questions you have with  your health care provider. Document Released: 12/02/2015 Document Revised: 02/14/2016 Document Reviewed: 12/02/2015 Elsevier Interactive Patient Education  [employer].

## 2019-03-31 ENCOUNTER — Other Ambulatory Visit: Payer: Self-pay

## 2019-03-31 ENCOUNTER — Other Ambulatory Visit: Payer: Commercial Managed Care - PPO

## 2019-03-31 DIAGNOSIS — I10 Essential (primary) hypertension: Secondary | ICD-10-CM

## 2019-03-31 DIAGNOSIS — R002 Palpitations: Secondary | ICD-10-CM

## 2019-03-31 DIAGNOSIS — Z0000 Encounter for general adult medical examination without abnormal findings: Secondary | ICD-10-CM

## 2019-03-31 DIAGNOSIS — R7303 Prediabetes: Secondary | ICD-10-CM | POA: Insufficient documentation

## 2019-03-31 DIAGNOSIS — I671 Cerebral aneurysm, nonruptured: Secondary | ICD-10-CM

## 2019-04-01 LAB — CBC WITH DIFFERENTIAL/PLATELET
Basophils Absolute: 0 10*3/uL (ref 0.0–0.2)
Basos: 1 %
EOS (ABSOLUTE): 0.1 10*3/uL (ref 0.0–0.4)
Eos: 1 %
Hematocrit: 38.4 % (ref 34.0–46.6)
Hemoglobin: 13.5 g/dL (ref 11.1–15.9)
Immature Grans (Abs): 0 10*3/uL (ref 0.0–0.1)
Immature Granulocytes: 0 %
Lymphocytes Absolute: 1.8 10*3/uL (ref 0.7–3.1)
Lymphs: 37 %
MCH: 34.1 pg — ABNORMAL HIGH (ref 26.6–33.0)
MCHC: 35.2 g/dL (ref 31.5–35.7)
MCV: 97 fL (ref 79–97)
Monocytes Absolute: 0.4 10*3/uL (ref 0.1–0.9)
Monocytes: 7 %
Neutrophils Absolute: 2.7 10*3/uL (ref 1.4–7.0)
Neutrophils: 54 %
Platelets: 231 10*3/uL (ref 150–450)
RBC: 3.96 x10E6/uL (ref 3.77–5.28)
RDW: 11.9 % (ref 11.7–15.4)
WBC: 4.9 10*3/uL (ref 3.4–10.8)

## 2019-04-01 LAB — COMPREHENSIVE METABOLIC PANEL
ALT: 79 IU/L — ABNORMAL HIGH (ref 0–32)
AST: 55 IU/L — ABNORMAL HIGH (ref 0–40)
Albumin/Globulin Ratio: 1.7 (ref 1.2–2.2)
Albumin: 4.3 g/dL (ref 3.8–4.8)
Alkaline Phosphatase: 90 IU/L (ref 39–117)
BUN/Creatinine Ratio: 13 (ref 12–28)
BUN: 12 mg/dL (ref 8–27)
Bilirubin Total: 0.4 mg/dL (ref 0.0–1.2)
CO2: 26 mmol/L (ref 20–29)
Calcium: 9.8 mg/dL (ref 8.7–10.3)
Chloride: 102 mmol/L (ref 96–106)
Creatinine, Ser: 0.94 mg/dL (ref 0.57–1.00)
GFR calc Af Amer: 75 mL/min/{1.73_m2} (ref >59)
GFR calc non Af Amer: 65 mL/min/{1.73_m2} (ref >59)
Globulin, Total: 2.6 g/dL (ref 1.5–4.5)
Glucose: 102 mg/dL — ABNORMAL HIGH (ref 65–99)
Potassium: 4.2 mmol/L (ref 3.5–5.2)
Sodium: 140 mmol/L (ref 134–144)
Total Protein: 6.9 g/dL (ref 6.0–8.5)

## 2019-04-01 LAB — LIPID PANEL WITH LDL/HDL RATIO
Cholesterol, Total: 225 mg/dL — ABNORMAL HIGH (ref 100–199)
HDL: 63 mg/dL (ref >39)
LDL Chol Calc (NIH): 149 mg/dL — ABNORMAL HIGH (ref 0–99)
LDL/HDL Ratio: 2.4 ratio (ref 0.0–3.2)
Triglycerides: 75 mg/dL (ref 0–149)
VLDL Cholesterol Cal: 13 mg/dL (ref 5–40)

## 2019-04-01 LAB — HEMOGLOBIN A1C
Est. average glucose Bld gHb Est-mCnc: 120 mg/dL
Hgb A1c MFr Bld: 5.8 % — ABNORMAL HIGH (ref 4.8–5.6)

## 2019-04-01 LAB — VITAMIN D 25 HYDROXY (VIT D DEFICIENCY, FRACTURES): Vit D, 25-Hydroxy: 62.9 ng/mL (ref 30.0–100.0)

## 2019-04-01 LAB — T4, FREE: Free T4: 1.09 ng/dL (ref 0.82–1.77)

## 2019-04-01 LAB — T3: T3, Total: 125 ng/dL (ref 71–180)

## 2019-04-01 LAB — HEPATITIS C ANTIBODY: Hep C Virus Ab: <0.1 s/co ratio (ref 0.0–0.9)

## 2019-04-01 LAB — TSH: TSH: 2.32 u[IU]/mL (ref 0.450–4.500)

## 2019-04-03 ENCOUNTER — Ambulatory Visit (INDEPENDENT_AMBULATORY_CARE_PROVIDER_SITE_OTHER): Payer: Commercial Managed Care - PPO | Admitting: Family Medicine

## 2019-04-03 ENCOUNTER — Encounter: Payer: Self-pay | Admitting: Family Medicine

## 2019-04-03 ENCOUNTER — Other Ambulatory Visit: Payer: Self-pay

## 2019-04-03 VITALS — BP 128/76 | HR 69 | Temp 99.0°F | Ht 67.0 in | Wt 186.0 lb

## 2019-04-03 DIAGNOSIS — I639 Cerebral infarction, unspecified: Secondary | ICD-10-CM

## 2019-04-03 DIAGNOSIS — I10 Essential (primary) hypertension: Secondary | ICD-10-CM | POA: Diagnosis not present

## 2019-04-03 DIAGNOSIS — I671 Cerebral aneurysm, nonruptured: Secondary | ICD-10-CM | POA: Diagnosis not present

## 2019-04-03 DIAGNOSIS — I67848 Other cerebrovascular vasospasm and vasoconstriction: Secondary | ICD-10-CM

## 2019-04-03 DIAGNOSIS — E559 Vitamin D deficiency, unspecified: Secondary | ICD-10-CM

## 2019-04-03 DIAGNOSIS — E785 Hyperlipidemia, unspecified: Secondary | ICD-10-CM | POA: Diagnosis not present

## 2019-04-03 DIAGNOSIS — R748 Abnormal levels of other serum enzymes: Secondary | ICD-10-CM

## 2019-04-03 DIAGNOSIS — F1010 Alcohol abuse, uncomplicated: Secondary | ICD-10-CM

## 2019-04-03 MED ORDER — ATORVASTATIN CALCIUM 10 MG PO TABS: 10.0000 mg | ORAL_TABLET | Freq: Every day | ORAL | 1 refills | Status: DC

## 2019-04-03 NOTE — Patient Instructions (Addendum)
So in 6 weeks because restarting the Lipitor we will bring you in just to recheck your liver enzymes ALT and AST then, in 4 months we will recheck your CMP and please do not drink alcohol for 3 to 4 weeks prior to that blood work we will also recheck your cholesterol since she will be on medicines for 4 months as well   Guidelines for a Low Cholesterol, Low Saturated Fat Diet   Fats - Limit total intake of fats and oils. - Avoid butter, stick margarine, shortening, lard, palm and coconut oils. - Limit mayonnaise, salad dressings, gravies and sauces, unless they are homemade with low-fat ingredients. - Limit chocolate. - Choose low-fat and nonfat products, such as low-fat mayonnaise, low-fat or non-hydrogenated peanut butter, low-fat or fat-free salad dressings and nonfat gravy. - Use vegetable oil, such as canola or olive oil. - Look for margarine that does not contain trans fatty acids. - Use nuts in moderate amounts. - Read ingredient labels carefully to determine both amount and type of fat present in foods. Limit saturated and trans fats! - Avoid high-fat processed and convenience foods.  Meats and Meat Alternatives - Choose fish, chicken, [place] and lean meats. - Use dried beans, peas, lentils and tofu. - Limit egg yolks to three to four per week. - If you eat red meat, limit to no more than three servings per week and choose loin or round cuts. - Avoid fatty meats, such as bacon, sausage, franks, luncheon meats and ribs. - Avoid all organ meats, including liver.  Dairy - Choose nonfat or low-fat milk, yogurt and cottage cheese. - Most cheeses are high in fat. Choose cheeses made from non-fat milk, such as mozzarella and ricotta cheese. - Choose light or fat-free cream cheese and sour cream. - Avoid cream and sauces made with cream.  Fruits and Vegetables - Eat a wide variety of fruits and vegetables. - Use lemon juice, vinegar or "mist" olive oil on vegetables. - Avoid adding  sauces, fat or oil to vegetables.  Breads, Cereals and Grains - Choose whole-grain breads, cereals, pastas and rice. - Avoid high-fat snack foods, such as granola, cookies, pies, pastries, doughnuts and croissants.  Cooking Tips - Avoid deep fried foods. - Trim visible fat off meats and remove skin from poultry before cooking. - Bake, broil, boil, poach or roast poultry, fish and lean meats. - Drain and discard fat that drains out of meat as you cook it. - Add little or no fat to foods. - Use vegetable oil sprays to grease pans for cooking or baking. - Steam vegetables. - Use herbs or no-oil marinades to flavor foods.  Nine ways to increase your "good" HDL cholesterol  High-density lipoprotein (HDL) is often referred to as the "good" cholesterol. Having high HDL levels helps carry cholesterol from your arteries to your liver, where it can be used or excreted.  Having high levels of HDL also has antioxidant and anti-inflammatory effects, and is linked to a reduced risk of heart disease (1, 2).  Most health experts recommend minimum blood levels of 40 mg/dl in men and 50 mg/dl in women.  While genetics definitely play a role, there are several other factors that affect HDL levels.  Here are nine healthy ways to raise your "good" HDL cholesterol.  1. Consume olive oil  two pieces of salmon on a plate olive oil being poured into a small dish Extra virgin olive oil may be more healthful than processed olive oils. Olive oil  is one of the healthiest fats around.  A large analysis of 42 studies with more than 800,000 participants found that olive oil was the only source of monounsaturated fat that seemed to reduce heart disease risk (3).  Research has shown that one of olive oil's heart-healthy effects is an increase in HDL cholesterol. This effect is thought to be caused by antioxidants it contains called polyphenols (4, 5, 6, 7).  Extra virgin olive oil has more polyphenols than  more processed olive oils, although the amount can still vary among different types and brands.  One study gave 200 healthy young men about 2 tablespoons (25 ml) of different olive oils per day for three weeks.  The researchers found that participants' HDL levels increased significantly more after they consumed the olive oil with the highest polyphenol content (6).  In another study, when 42 older adults consumed about 4 tablespoons (50 ml) of high-polyphenol extra virgin olive oil every day for six weeks, their HDL cholesterol increased by 6.5 mg/dl, on average (7).  In addition to raising HDL levels, olive oil has been found to boost HDL's anti-inflammatory and antioxidant function in studies of older people and individuals with high cholesterol levels ( 7, 8, 9).  Whenever possible, select high-quality, certified extra virgin olive oils, which tend to be highest in polyphenols.  Bottom line: Extra virgin olive oil with a high polyphenol content has been shown to increase HDL levels in healthy people, the elderly and individuals with high cholesterol.  2. Follow a low-carb or ketogenic diet  Low-carb and ketogenic diets provide a number of health benefits, including weight loss and reduced blood sugar levels.  They have also been shown to increase HDL cholesterol in people who tend to have lower levels.  This includes those who are obese, insulin resistant or diabetic (10, 11, 12, 13, 14, 15, 16, 17).  In one study, people with type 2 diabetes were split into two groups.  One followed a diet consuming less than 50 grams of carbs per day. The other followed a high-carb diet.  Although both groups lost weight, the low-carb group's HDL cholesterol increased almost twice as much as the high-carb group's did (14).  In another study, obese people who followed a low-carb diet experienced an increase in HDL cholesterol of 5 mg/dl overall.  Meanwhile, in the same study, the participants who  ate a low-fat, high-carb diet showed a decrease in HDL cholesterol (15).  This response may partially be due to the higher levels of fat people typically consume on low-carb diets.  One study in overweight women found that diets high in meat and cheese increased HDL levels by 5-8%, compared to a higher-carb diet (18).  What's more, in addition to raising HDL cholesterol, very-low-carb diets have been shown to decrease triglycerides and improve several other risk factors for heart disease (13, 14, 16, 17).  Bottom line: Low-carb and ketogenic diets typically increase HDL cholesterol levels in people with diabetes, metabolic syndrome and obesity.  3. Exercise regularly  Being physically active is important for heart health.  Studies have shown that many different types of exercise are effective at raising HDL cholesterol, including strength training, high-intensity exercise and aerobic exercise (19, 20, 21, 22, 23, 24).  However, the biggest increases in HDL are typically seen with high-intensity exercise.  One small study followed women who were living with polycystic ovary syndrome (PCOS), which is linked to a higher risk of insulin resistance. The study required them to perform  high-intensity exercise three times a week.  The exercise led to an increase in HDL cholesterol of 8 mg/dL after 10 weeks. The women also showed improvements in other health markers, including decreased insulin resistance and improved arterial function (23).  In a 12-week study, overweight men who performed high-intensity exercise experienced a 10% increase in HDL cholesterol.  In contrast, the low-intensity exercise group showed only a 2% increase and the endurance training group experienced no change (24).  However, even lower-intensity exercise seems to increase HDL's anti-inflammatory and antioxidant capacities, whether or not HDL levels change (20, 21, 25).  Overall, high-intensity exercise such as  high-intensity interval training (HIIT) and high-intensity circuit training (HICT) may boost HDL cholesterol levels the most.  Bottom line: Exercising several times per week can help raise HDL cholesterol and enhance its anti-inflammatory and antioxidant effects. High-intensity forms of exercise may be especially effective.  4. Add coconut oil to your diet  Studies have shown that coconut oil may reduce appetite, increase metabolic rate and help protect brain health, among other benefits.  Some people may be concerned about coconut oil's effects on heart health due to its high saturated fat content.  However, it appears that coconut oil is actually quite heart healthy.  Coconut oil tends to raise HDL cholesterol more than many other types of fat.  In addition, it may improve the ratio of low-density-lipoprotein (LDL) cholesterol, the "bad" cholesterol, to HDL cholesterol. Improving this ratio reduces heart disease risk (26, 27, 28, 29).  One study examined the health effects of coconut oil on 34 women with excess belly fat. The researchers found that participants who took coconut oil daily experienced increased HDL cholesterol and a lower LDL-to-HDL ratio.  In contrast, the group who took soybean oil daily had a decrease in HDL cholesterol and an increase in the LDL-to-HDL ratio (29).  Most studies have found these health benefits occur at a dosage of about 2 tablespoons (30 ml) of coconut oil per day. It's best to incorporate this into cooking rather than eating spoonfuls of coconut oil on their own.  Bottom line: Consuming 2 tablespoons (30 ml) of coconut oil per day may help increase HDL cholesterol levels.  5. Stop smoking  cigarette butt Quitting smoking can reduce the risk of heart disease and lung cancer. Smoking increases the risk of many health problems, including heart disease and lung cancer (30).  One of its negative effects is a suppression of HDL cholesterol.  Some  studies have found that quitting smoking can increase HDL levels. Indeed, one study found no significant differences in HDL levels between former smokers and people who had never smoked (31, 32, 33, 34, 35).  In a one-year study of more than 1,500 people, those who quit smoking had twice the increase in HDL as those who resumed smoking within the year. The number of large HDL particles also increased, which further reduced heart disease risk (32).  One study followed smokers who switched from traditional cigarettes to electronic cigarettes for one year. They found that the switch was associated with an increase in HDL cholesterol of 5 mg/dl, on average (33).  When it comes to the effect of nicotine replacement patches on HDL levels, research results have been mixed.  One study found that nicotine replacement therapy led to higher HDL cholesterol. However, other research suggests that people who use nicotine patches likely won't see increases in HDL levels until after replacement therapy is completed (34, 36).  Even in studies where HDL  cholesterol levels didn't increase after people quit smoking, HDL function improved, resulting in less inflammation and other beneficial effects on heart health (37).  Bottom line: Quitting smoking can increase HDL levels, improve HDL function and help protect heart health.  6. Lose weight  When overweight and obese people lose weight, their HDL cholesterol levels usually increase.  What's more, this benefit seems to occur whether weight loss is achieved by calorie counting, carb restriction, intermittent fasting, weight loss surgery or a combination of diet and exercise (16, 38, 39, 40, 41, 42).  One study examined HDL levels in more than 3,000 overweight and obese [place] adults who followed a lifestyle modification program for one year.  The researchers found that losing at least 6.6 lbs (3 kg) led to an increase in HDL cholesterol of 4 mg/dl, on average  (41).  In another study, when obese people with type 2 diabetes consumed calorie-restricted diets that provided 20-30% of calories from protein, they experienced significant increases in HDL cholesterol levels (42).  The key to achieving and maintaining healthy HDL cholesterol levels is choosing the type of diet that makes it easiest for you to lose weight and keep it off.  Bottom Line: Several methods of weight loss have been shown to increase HDL cholesterol levels in people who are overweight or obese.  7. Choose purple produce  Consuming purple-colored fruits and vegetables is a delicious way to potentially increase HDL cholesterol.  Purple produce contains antioxidants known as anthocyanins.  Studies using anthocyanin extracts have shown that they help fight inflammation, protect your cells from damaging free radicals and may also raise HDL cholesterol levels (43, 44, 45, 46).  In a 24-week study of 58 people with diabetes, those who took an anthocyanin supplement twice a day experienced a 19% increase in HDL cholesterol, on average, along with other improvements in heart health markers (45).  In another study, when people with cholesterol issues took anthocyanin extract for 12 weeks, their HDL cholesterol levels increased by 13.7% (46).  Although these studies used extracts instead of foods, there are several fruits and vegetables that are very high in anthocyanins. These include eggplant, purple corn, red cabbage, blueberries, blackberries and black raspberries.  Bottom line: Consuming fruits and vegetables rich in anthocyanins may help increase HDL cholesterol levels.  8. Eat fatty fish often  The omega-3 fats in fatty fish provide major benefits to heart health, including a reduction in inflammation and better functioning of the cells that line your arteries (47, 48).  There's some research showing that eating fatty fish or taking fish oil may also help raise low levels of HDL  cholesterol (49, 50, 51, 52, 53).  In a study of 33 heart disease patients, participants that consumed fatty fish four times per week experienced an increase in HDL cholesterol levels. The particle size of their HDL also increased (52).  In another study, overweight men who consumed herring five days a week for six weeks had a 5% increase in HDL cholesterol, compared with their levels after eating lean pork and chicken five days a week (53).  However, there are a few studies that found no increase in HDL cholesterol in response to increased fish or omega-3 supplement intake (54, 55).  In addition to herring, other types of fatty fish that may help raise HDL cholesterol include salmon, sardines, mackerel and anchovies.  Bottom line: Eating fatty fish several times per week may help increase HDL cholesterol levels and provide other benefits to heart health.  9. Avoid artificial trans fats  Artificial trans fats have many negative health effects due to their inflammatory properties (56, 57).  There are two types of trans fats. One kind occurs naturally in animal products, including full-fat dairy.  In contrast, the artificial trans fats found in margarines and processed foods are created by adding hydrogen to unsaturated vegetable and seed oils. These fats are also known as industrial trans fats or partially hydrogenated fats.  Research has shown that, in addition to increasing inflammation and contributing to several health problems, these artificial trans fats may lower HDL cholesterol levels.  In one study, researchers compared how people's HDL levels responded when they consumed different margarines.  The study found that participants' HDL cholesterol levels were 10% lower after consuming margarine containing partially hydrogenated soybean oil, compared to their levels after consuming palm oil (58).  Another controlled study followed 40 adults who had diets high in different types of trans  fats.  They found that HDL cholesterol levels in women were significantly lower after they consumed the diet high in industrial trans fats, compared to the diet containing naturally occurring trans fats (59).  To protect heart health and keep HDL cholesterol in the healthy range, it's best to avoid artificial trans fats altogether.  Bottom line: Artificial trans fats have been shown to lower HDL levels and increase inflammation, compared to other fats.  Take home message  Although your HDL cholesterol levels are partly determined by your genetics, there are many things you can do to naturally increase your own levels.  Fortunately, the practices that raise HDL cholesterol often provide other health benefits as well.   Risk factors for prediabetes and type 2 diabetes  Researchers don't fully understand why some people develop prediabetes and type 2 diabetes and others don't.  It's clear that certain factors increase the risk, however, including:  Weight. The more fatty tissue you have, the more resistant your cells become to insulin.  Inactivity. The less active you are, the greater your risk. Physical activity helps you control your weight, uses up glucose as energy and makes your cells more sensitive to insulin.  Family history. Your risk increases if a parent or sibling has type 2 diabetes.  Race. Although it's unclear why, people of certain races -- including blacks, Hispanics, American Indians and Asian-Americans -- are at higher risk.  Age. Your risk increases as you get older. This may be because you tend to exercise less, lose muscle mass and gain weight as you age. But type 2 diabetes is also increasing dramatically among children, adolescents and younger adults.  Gestational diabetes. If you developed gestational diabetes when you were pregnant, your risk of developing prediabetes and type 2 diabetes later increases. If you gave birth to a baby weighing more than 9 pounds (4  kilograms), you're also at risk of type 2 diabetes.  Polycystic ovary syndrome. For women, having polycystic ovary syndrome -- a common condition characterized by irregular menstrual periods, excess hair growth and obesity -- increases the risk of diabetes.  High blood pressure. Having blood pressure over 140/90 millimeters of mercury (mm Hg) is linked to an increased risk of type 2 diabetes.  Abnormal cholesterol and triglyceride levels. If you have low levels of high-density lipoprotein (HDL), or "good," cholesterol, your risk of type 2 diabetes is higher. Triglycerides are another type of fat carried in the blood. People with high levels of triglycerides have an increased risk of type 2 diabetes. Your doctor can let you  know what your cholesterol and triglyceride levels are.  A good guide to good carbs: The glycemic index ---If you have diabetes, or at risk for diabetes, you know all too well that when you eat carbohydrates, your blood sugar goes up. The total amount of carbs you consume at a meal or in a snack mostly determines what your blood sugar will do. But the food itself also plays a role. A serving of white rice has almost the same effect as eating pure table sugar -- a quick, high spike in blood sugar. A serving of lentils has a slower, smaller effect.  ---Picking good sources of carbs can help you control your blood sugar and your weight. Even if you don't have diabetes, eating healthier carbohydrate-rich foods can help ward off a host of chronic conditions, from heart disease to various cancers to, well, diabetes.  ---One way to choose foods is with the glycemic index (GI). This tool measures how much a food boosts blood sugar.  The glycemic index rates the effect of a specific amount of a food on blood sugar compared with the same amount of pure glucose. A food with a glycemic index of 28 boosts blood sugar only 28% as much as pure glucose. One with a GI of 95 acts like pure  glucose.    High glycemic foods result in a quick spike in insulin and blood sugar (also known as blood glucose).  Low glycemic foods have a slower, smaller effect- these are healthier for you.   Using the glycemic index Using the glycemic index is easy: choose foods in the low GI category instead of those in the high GI category (see below), and go easy on those in between. Low glycemic index (GI of 55 or less): Most fruits and vegetables, beans, minimally processed grains, pasta, low-fat dairy foods, and nuts.  Moderate glycemic index (GI 56 to 69): White and sweet potatoes, corn, white rice, couscous, breakfast cereals such as Cream of Wheat and Mini Wheats.  High glycemic index (GI of 70 or higher): White bread, rice cakes, most crackers, bagels, cakes, doughnuts, croissants, most packaged breakfast cereals. You can see the values for 100 commons foods and get links to more at [email or web address].si.  Swaps for lowering glycemic index  Instead of this high-glycemic index food Eat this lower-glycemic index food  White rice Brown rice or converted rice  Instant oatmeal Steel-cut oats  Cornflakes Bran flakes  Baked potato Pasta, bulgur  White bread Whole-grain bread  Corn Peas or leafy greens       Prediabetes Eating Plan  Prediabetes--also called impaired glucose tolerance or impaired fasting glucose--is a condition that causes blood sugar (blood glucose) levels to be higher than normal. Following a healthy diet can help to keep prediabetes under control. It can also help to lower the risk of type 2 diabetes and heart disease, which are increased in people who have prediabetes. Along with regular exercise, a healthy diet:  Promotes weight loss.  Helps to control blood sugar levels.  Helps to improve the way that the body uses insulin.   WHAT DO I NEED TO KNOW ABOUT THIS EATING PLAN?   Use the glycemic index (GI) to plan your meals. The index tells you how quickly a  food will raise your blood sugar. Choose low-GI foods. These foods take a longer time to raise blood sugar.  Pay close attention to the amount of carbohydrates in the food that you eat. Carbohydrates increase blood sugar levels.  Keep track of how many calories you take in. Eating the right amount of calories will help you to achieve a healthy weight. Losing about 7 percent of your starting weight can help to prevent type 2 diabetes.  You may want to follow a Mediterranean diet. This diet includes a lot of vegetables, lean meats or fish, whole grains, fruits, and healthy oils and fats.   WHAT FOODS CAN I EAT?  Grains Whole grains, such as whole-wheat or whole-grain breads, crackers, cereals, and pasta. Unsweetened oatmeal. Bulgur. Barley. Quinoa. Brown rice. Corn or whole-wheat flour tortillas or taco shells. Vegetables Lettuce. Spinach. Peas. Beets. Cauliflower. Cabbage. Broccoli. Carrots. Tomatoes. Squash. Eggplant. Herbs. Peppers. Onions. Cucumbers. Brussels sprouts. Fruits Berries. Bananas. Apples. Oranges. Grapes. Papaya. Mango. Pomegranate. Kiwi. Grapefruit. Cherries. Meats and Other Protein Sources Seafood. Lean meats, such as chicken and [place] or lean cuts of pork and beef. Tofu. Eggs. Nuts. Beans. Dairy Low-fat or fat-free dairy products, such as yogurt, cottage cheese, and cheese. Beverages Water. Tea. Coffee. Sugar-free or diet soda. Seltzer water. Milk. Milk alternatives, such as soy or almond milk. Condiments Mustard. Relish. Low-fat, low-sugar ketchup. Low-fat, low-sugar barbecue sauce. Low-fat or fat-free mayonnaise. Sweets and Desserts Sugar-free or low-fat pudding. Sugar-free or low-fat ice cream and other frozen treats. Fats and Oils Avocado. Walnuts. Olive oil. The items listed above may not be a complete list of recommended foods or beverages. Contact your dietitian for more options.    WHAT FOODS ARE NOT RECOMMENDED?  Grains Refined white flour and flour  products, such as bread, pasta, snack foods, and cereals. Beverages Sweetened drinks, such as sweet iced tea and soda. Sweets and Desserts Baked goods, such as cake, cupcakes, pastries, cookies, and cheesecake. The items listed above may not be a complete list of foods and beverages to avoid. Contact your dietitian for more information.   This information is not intended to replace advice given to you by your health care provider. Make sure you discuss any questions you have with your health care provider.   Document Released: 11/09/2014 Document Reviewed: 11/09/2014 [employer] Interactive Patient Education [employer].

## 2019-04-03 NOTE — Progress Notes (Signed)
Assessment and plan:  1. HTN, goal below 130/80   2. Hyperlipidemia, unspecified hyperlipidemia type   3. Brain aneurysm   4. Vasospasm of cerebral artery   5. Excessive drinking alcohol   6. Elevated liver enzymes   7. Vitamin D deficiency   8. Cerebrovascular event ([place])      - Reviewed recent lab work (03/31/2019) in depth with patient today.  All lab work within normal limits unless otherwise noted.  Extensive education provided and all questions answered.  Essential Hypertension, Goal Below 130/80 - BP was elevated at 151/98 last OV on 03/18/2019. - Stable at home with review of BP log.  - Ongoing prudent lifestyle changes such as dash diet and engaging in a regular exercise program discussed with patient.  Education provided.  - Ongoing ambulatory BP monitoring encouraged.  Keep log and bring to next OV. - Patient knows to check her BP regularly, or if she feels unusual.  - Will continue to monitor.  Elevated Liver Enzymes - Excessive Drinking Alcohol AST = 55, elevated last check. ALT = 79, elevated last check.  - To help lower the liver enzymes, recommended that the patient abstain from alcohol completely for a month, and then repeat labs to re-evaluate.  - Discussed that at present, we assume the liver enzymes are elevated from chronic alcohol consumption, and that discontinuing use of alcohol should cause the enzymes to resolve.  If it does not, further assessment and evaluation will be indicated.  Patient understands and agrees to this plan.  - Will continue to monitor.  Vitamin D Deficiency - WNL at 62.9 last check. - Discussed 40-60 as ideal range. - Advised pt to continue supplementation as established.  See med list. - Will continue to monitor and re-check as recommended.  Hyperlipidemia w/ History of Cerebrovascular Event Triglycerides = 75, up from 55 four years ago. HDL = 63, up from 49  four years ago. LDL = 149, elevated.  The 10-year ASCVD risk score Mikey Bussing DC Brooke Bonito., et al., 2013) is: 5.5%   Values used to calculate the score:     Age: 63 years     Sex: Female     Is Non-Hispanic African American: No     Diabetic: No     Tobacco smoker: No     Systolic Blood Pressure: 703 mmHg     Is BP treated: Yes     HDL Cholesterol: 63 mg/dL     Total Cholesterol: 225 mg/dL  - Prudent dietary changes such as low saturated & trans fat and low carb diets discussed with patient.  Encouraged regular exercise and weight loss when appropriate.  Advised patient to largely avoid consumption of foods such as red meats and cheeses.  - Educational handouts provided at patient's desire.  - Discussed that because of patient's history of cerebrovascular event, STRONGLY advised patient to begin cholesterol medications at this time.  - Patient agrees to begin medication today.  See med list. - Will monitor liver enzymes closely, checking AST & ALT in 6 weeks.  - To reduce chance of S-E on medication, encouraged patient to hydrate adequately and engage in regular physical activity.  - Will continue to monitor and re-check as recommended.  HbA1c Elevated - Prediabetes - HbA1c 5.8 last check.  - Counseled patient on prevention of disease and discussed dietary and lifestyle modifications as first line.  Importance of low carb diet discussed with patient in addition to regular exercise.   -  Reviewed that alcohol is carbs and that avoiding alcohol will help reduce carb consumption.  - Will continue to monitor and re-check as recommended.  BMI Counseling - Body mass index is 29.13 kg/m Explained to patient what BMI refers to, and what it means medically.    Told patient to think about it as a "medical risk stratification measurement" and how increasing BMI is associated with increasing risk/ or worsening state of various diseases such as hypertension, hyperlipidemia, diabetes, premature OA,  depression etc.  American Heart Association guidelines for healthy diet, basically Mediterranean diet, and exercise guidelines of 30 minutes 5 days per week or more discussed in detail.  Health counseling performed.  All questions answered.  Lifestyle & Preventative Health Maintenance - Advised patient to continue working toward exercising to improve overall mental, physical, and emotional health.    - Reviewed the "spokes of the wheel" of mood and health management.  Stressed the importance of ongoing prudent habits, including regular exercise, appropriate sleep hygiene, healthful dietary habits, and prayer/meditation to relax.  - Encouraged patient to engage in daily physical activity as tolerated, especially a formal exercise routine.  Recommended that the patient eventually strive for at least 150 minutes of moderate cardiovascular activity per week according to guidelines established by the [facility].   - Healthy dietary habits encouraged, including low-carb, and high amounts of lean protein in diet.   - Patient should also consume adequate amounts of water.  Recommendations - Repeat CMP in 4 months, with no alcohol or tylenol for 3-4 weeks prior. - Will repeat FLP in 4 months because started new cholesterol meds.   Education and routine counseling performed. Handouts provided.    Orders Placed This Encounter  Procedures   ALT   AST   Lipid panel   Comprehensive metabolic panel    Meds ordered this encounter  Medications   atorvastatin (LIPITOR) 10 MG tablet    Sig: Take 1 tablet (10 mg total) by mouth at bedtime.    Dispense:  90 tablet    Refill:  1    Medications Discontinued During This Encounter  Medication Reason   Glucosamine HCl (GLUCOSAMINE PO) Error      Return for 6wks- lab only; then in 105mo come in for fasting labs then ov 3-5 d later- started new chol med.   Anticipatory guidance and routine counseling done re: condition, txmnt options and need  for follow up. All questions of patient's were answered.   Expresses verbal understanding and consents to current therapy plan and treatment regiment.  Please see AVS handed out to patient at the end of our visit for additional patient instructions/ counseling done pertaining to today's office visit.  Note:  This document was prepared using Dragon voice recognition software and may include unintentional dictation errors.  This document serves as a record of services personally performed by DMellody Dance DO. It was created on her behalf by KToni Amend a trained medical scribe. The creation of this record is based on the scribe's personal observations and the provider's statements to them.   I have reviewed the above medical documentation for accuracy and completeness and I concur.  DMellody Dance DO 04/04/2019 9:31 AM      ----------------------------------------------------------------------------------------------------------------------  Subjective:   CC:   Shannon DRAKEFORDis a 63y.o. [facility] of [facility]work that was done in addition to f/up on chronic conditions we are managing  for pt.  1. All recent blood work that we ordered was reviewed with patient today.  Patient was counseled on all abnormalities and we discussed dietary and lifestyle changes that could help those values (also medications when appropriate).  Extensive health counseling performed and all patient's concerns/ questions were addressed.  See labs below and also plan for more details of these abnormalities  Notes her diet is good aside from after dinner, when she "becomes a Snickers freak."  States "I hate to walk.  Lots of people enjoy it, but I think it's the most boring thing on the planet."  1. HTN HPI:  -  Her blood pressure has been controlled at home.  Pt is checking it at home.  Home BP have run from  55-974 systolic over 16-38 diastolic.  Notes she checked her BP at all different times.  - Patient reports good compliance with blood pressure medications  - Denies medication S-E   - Smoking Status noted   - She denies new onset of: chest pain, exercise intolerance, shortness of breath, dizziness, visual changes, headache, lower extremity swelling or claudication.   Last 3 blood pressure readings in our office are as follows: BP Readings from Last 3 Encounters:  04/03/19 128/76  03/18/19 (!) 151/98  02/24/19 112/66    Filed Weights   04/03/19 0854  Weight: 186 lb (84.4 kg)         Wt Readings from Last 3 Encounters:  04/03/19 186 lb (84.4 kg)  03/18/19 181 lb 9.6 oz (82.4 kg)  02/24/19 177 lb 12.8 oz (80.6 kg)   BP Readings from Last 3 Encounters:  04/03/19 128/76  03/18/19 (!) 151/98  02/24/19 112/66   Pulse Readings from Last 3 Encounters:  04/03/19 69  03/18/19 77  02/24/19 74   BMI Readings from Last 3 Encounters:  04/03/19 29.13 kg/m  03/18/19 28.44 kg/m  02/24/19 27.85 kg/m     Patient Care Team    Relationship Specialty Notifications Start End  Mellody Dance, DO PCP - General Family Medicine  03/18/19   Glory Buff, MD  Gynecology  01/16/13   Luanne Bras, MD  Radiology  01/16/13   Leeroy Cha, MD  Neurosurgery  01/16/13   Lafayette Dragon, MD (Inactive)  Gastroenterology  01/16/13   Deneise Lever, MD  Sleep Medicine  01/16/13   Clent Jacks, MD  Ophthalmology  06/07/14     Full medical history updated and reviewed in the office today  Patient Active Problem List   Diagnosis Date Noted   Vitamin D deficiency 04/03/2019   Excessive drinking alcohol 04/03/2019   Elevated liver enzymes 04/03/2019   Hyperlipidemia 04/03/2019   HTN, goal below 130/80 03/18/2019   OSA on CPAP 03/18/2019   Heart palpitations 03/18/2019   Insomnia 11/05/2017   Routine general medical examination at a health care facility 09/23/2015    Essential hypertension 09/23/2015   Brain aneurysm 02/10/2014   Osteoarthritis    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   SAH (subarachnoid hemorrhage) ([place]) 05/10/2012   Obstructive sleep apnea 06/30/2011   Seasonal and perennial allergic rhinitis 11/22/2008    Past Medical History:  Diagnosis Date   Brain aneurysm 05/09/12   coil/stent, angio with stent 02/2014   Cervical cancer ([place])    Diverticulosis    Environmental allergies    History of shingles    HTN (hypertension)    Menopause    Migraine    Osteoarthritis    left knee  Personal history of colonic polyps    noted on 2012 colonoscopy   Sleep apnea 06/2011 dx   CPAP 13/apria   Stroke (cerebrum) [facility])    Vertigo     Past Surgical History:  Procedure Laterality Date   ANEURYSM COILING  05/09/2012   middle   Brain stent  2013, 2015   BREAST BIOPSY Right 1988   BENIGN   BREAST BIOPSY Left 1993   BENIGN   KNEE ARTHROSCOPY Left    RADIOLOGY WITH ANESTHESIA N/A 09/03/2012   Procedure: RADIOLOGY WITH ANESTHESIA;  Surgeon: Rob Hickman, MD;  Location: [place];  Service: Radiology;  Laterality: N/A;  aneurysm embolization    RADIOLOGY WITH ANESTHESIA N/A 02/10/2014   Procedure: RADIOLOGY WITH ANESTHESIA, emobolization;  Surgeon: Rob Hickman, MD;  Location: [place];  Service: Radiology;  Laterality: N/A;    Social History   Tobacco Use   Smoking status: Former Smoker    Packs/day: 0.25    Years: 10.00    Pack years: 2.50    Types: Cigarettes    Quit date: 07/10/2011    Years since quitting: 7.7   Smokeless tobacco: Never Used  Substance Use Topics   Alcohol use: Yes    Alcohol/week: 0.0 standard drinks    Comment: 1-2 drinks 3-5 times a week    Family Hx: Family History  Problem Relation Age of Onset   Emphysema Father        smoker   Lung cancer Father 11   Prostate cancer Father    Kidney cancer Father    Scleroderma Mother 72   Heart attack Maternal  Grandmother      Medications: Current Outpatient Medications  Medication Sig Dispense Refill   aspirin 325 MG tablet Take 325 mg by mouth daily.     cetirizine (ZYRTEC) 10 MG tablet Take 10 mg by mouth daily.     hydrochlorothiazide (HYDRODIURIL) 12.5 MG tablet TK 1 T PO QD  3   Omega-3 Fatty Acids (FISH OIL PO) Take 1 capsule by mouth daily.     omeprazole (PRILOSEC) 40 MG capsule TAKE 1 CAPSULE(40 MG) BY MOUTH DAILY 90 capsule 0   VITAMIN D, CHOLECALCIFEROL, PO Take 25 mcg by mouth daily.     zolpidem (AMBIEN) 5 MG tablet Take 5 mg by mouth at bedtime as needed for sleep.     atorvastatin (LIPITOR) 10 MG tablet Take 1 tablet (10 mg total) by mouth at bedtime. 90 tablet 1   No current facility-administered medications for this visit.     Allergies:  No Known Allergies   Review of Systems: General:   No F/C, wt loss Pulm:   No DIB, SOB, pleuritic chest pain Card:  No CP, palpitations Abd:  No n/v/d or pain Ext:  No inc edema from baseline  Objective:  Blood pressure 128/76, pulse 69, temperature 99 F (37.2 C), height _0  (1.702 m), weight 186 lb (84.4 kg), SpO2 98 %. Body mass index is 29.13 kg/m. Gen:   Well NAD, A and O *3 HEENT:    [place]/AT, EOMI,  MMM Lungs:   Normal work of breathing. CTA B/L, no Wh, rhonchi Heart:   RRR, S1, S2 WNL's, no MRG Abd:   No gross distention Exts:    warm, pink,  Brisk capillary refill, warm and well perfused.  Psych:    No HI/SI, judgement and insight good, Euthymic mood. Full Affect.   Recent Results (from the past 2160 hour(s))  Hepatitis C Antibody  Status: None   Collection Time: 03/31/19  8:29 AM  Result Value Ref Range   Hep C Virus Ab <0.1 0.0 - 0.9 s/co ratio    Comment:                                   Negative:     < 0.8                              Indeterminate: 0.8 - 0.9                                   Positive:     > 0.9  The CDC recommends that a positive HCV antibody result  be followed up with a HCV  Nucleic Acid Amplification  test (998338).   Vitamin D (25 hydroxy)     Status: None   Collection Time: 03/31/19  8:29 AM  Result Value Ref Range   Vit D, 25-Hydroxy 62.9 30.0 - 100.0 ng/mL    Comment: Vitamin D deficiency has been defined by the [place] practice guideline as a level of serum 25-OH vitamin D less than 20 ng/mL (1,2). The Endocrine Society went on to further define vitamin D insufficiency as a level between 21 and 29 ng/mL (2). 1. IOM (Institute of Medicine). 2010. Dietary reference    intakes for calcium and D. [place]: The    [employer]. 2. Holick MF, Binkley Borden, Bischoff-Ferrari HA, et al.    Evaluation, treatment, and prevention of vitamin D    deficiency: an Endocrine Society clinical practice    guideline. JCEM. 2011 Jul; 96(7):1911-30.   T3     Status: None   Collection Time: 03/31/19  8:29 AM  Result Value Ref Range   T3, Total 125 71 - 180 ng/dL  T4, free     Status: None   Collection Time: 03/31/19  8:29 AM  Result Value Ref Range   Free T4 1.09 0.82 - 1.77 ng/dL  TSH     Status: None   Collection Time: 03/31/19  8:29 AM  Result Value Ref Range   TSH 2.320 0.450 - 4.500 uIU/mL  Lipid Panel With LDL/HDL Ratio     Status: Abnormal   Collection Time: 03/31/19  8:29 AM  Result Value Ref Range   Cholesterol, Total 225 (H) 100 - 199 mg/dL   Triglycerides 75 0 - 149 mg/dL   HDL 63 >39 mg/dL   VLDL Cholesterol Cal 13 5 - 40 mg/dL   LDL Chol Calc (NIH) 149 (H) 0 - 99 mg/dL   LDL/HDL Ratio 2.4 0.0 - 3.2 ratio    Comment:                                     LDL/HDL Ratio                                             Men  Women  1/2 Avg.Risk  1.0    1.5                                   Avg.Risk  3.6    3.2                                2X Avg.Risk  6.2    5.0                                3X Avg.Risk  8.0    6.1   HgB A1c     Status: Abnormal   Collection Time:  03/31/19  8:29 AM  Result Value Ref Range   Hgb A1c MFr Bld 5.8 (H) 4.8 - 5.6 %    Comment:          Prediabetes: 5.7 - 6.4          Diabetes: >6.4          Glycemic control for adults with diabetes: <7.0    Est. average glucose Bld gHb Est-mCnc 120 mg/dL  CBC w/Diff     Status: Abnormal   Collection Time: 03/31/19  8:29 AM  Result Value Ref Range   WBC 4.9 3.4 - 10.8 x10E3/uL   RBC 3.96 3.77 - 5.28 x10E6/uL   Hemoglobin 13.5 11.1 - 15.9 g/dL   Hematocrit 38.4 34.0 - 46.6 %   MCV 97 79 - 97 fL   MCH 34.1 (H) 26.6 - 33.0 pg   MCHC 35.2 31.5 - 35.7 g/dL   RDW 11.9 11.7 - 15.4 %   Platelets 231 150 - 450 x10E3/uL   Neutrophils 54 Not Estab. %   Lymphs 37 Not Estab. %   Monocytes 7 Not Estab. %   Eos 1 Not Estab. %   Basos 1 Not Estab. %   Neutrophils Absolute 2.7 1.4 - 7.0 x10E3/uL   Lymphocytes Absolute 1.8 0.7 - 3.1 x10E3/uL   Monocytes Absolute 0.4 0.1 - 0.9 x10E3/uL   EOS (ABSOLUTE) 0.1 0.0 - 0.4 x10E3/uL   Basophils Absolute 0.0 0.0 - 0.2 x10E3/uL   Immature Granulocytes 0 Not Estab. %   Immature Grans (Abs) 0.0 0.0 - 0.1 x10E3/uL  Comp Met (CMET)     Status: Abnormal   Collection Time: 03/31/19  8:29 AM  Result Value Ref Range   Glucose 102 (H) 65 - 99 mg/dL   BUN 12 8 - 27 mg/dL   Creatinine, Ser 0.94 0.57 - 1.00 mg/dL   GFR calc non Af Amer 65 >59 mL/min/1.73   GFR calc Af Amer 75 >59 mL/min/1.73   BUN/Creatinine Ratio 13 12 - 28   Sodium 140 134 - 144 mmol/L   Potassium 4.2 3.5 - 5.2 mmol/L   Chloride 102 96 - 106 mmol/L   CO2 26 20 - 29 mmol/L   Calcium 9.8 8.7 - 10.3 mg/dL   Total Protein 6.9 6.0 - 8.5 g/dL   Albumin 4.3 3.8 - 4.8 g/dL   Globulin, Total 2.6 1.5 - 4.5 g/dL   Albumin/Globulin Ratio 1.7 1.2 - 2.2   Bilirubin Total 0.4 0.0 - 1.2 mg/dL   Alkaline Phosphatase 90 39 - 117 IU/L   AST 55 (H) 0 - 40 IU/L   ALT 79 (H) 0 - 32 IU/L

## 2019-04-09 ENCOUNTER — Encounter: Payer: Self-pay | Admitting: Family Medicine

## 2019-04-14 ENCOUNTER — Encounter: Payer: Self-pay | Admitting: Family Medicine

## 2019-04-30 ENCOUNTER — Encounter: Payer: Self-pay | Admitting: Family Medicine

## 2019-05-13 ENCOUNTER — Telehealth (HOSPITAL_COMMUNITY): Payer: Self-pay

## 2019-05-13 NOTE — Telephone Encounter (Signed)
Returned pt's call about f/u. Left vm that we are waiting on insurance. Will call once authorized. AW

## 2019-05-15 ENCOUNTER — Other Ambulatory Visit: Payer: Commercial Managed Care - PPO

## 2019-05-19 ENCOUNTER — Other Ambulatory Visit: Payer: Self-pay | Admitting: Internal Medicine

## 2019-05-19 MED ORDER — ZOLPIDEM TARTRATE 5 MG PO TABS: 5.0000 mg | ORAL_TABLET | Freq: Every evening | ORAL | 5 refills | Status: DC | PRN

## 2019-05-19 NOTE — Telephone Encounter (Signed)
Refill e-sent 

## 2019-05-19 NOTE — Telephone Encounter (Signed)
Received a faxed refill request from [place] on [facility] for pt's Zolpidem 5mg  tabs Last refill: 11/24/2018 #30 with 5 refills, take 1 po qhs prn sleep Last OV: 02/24/2019 Next OV: 02/24/2020 Medication has been pended in pt's chart as it was last refilled.  CY please advise on refill.  Thanks!

## 2019-06-02 ENCOUNTER — Other Ambulatory Visit (HOSPITAL_COMMUNITY): Payer: Self-pay | Admitting: Interventional Radiology

## 2019-06-02 DIAGNOSIS — I671 Cerebral aneurysm, nonruptured: Secondary | ICD-10-CM

## 2019-06-26 ENCOUNTER — Ambulatory Visit (HOSPITAL_COMMUNITY)
Admission: RE | Admit: 2019-06-26 | Discharge: 2019-06-26 | Disposition: A | Discharge status: Home / Self Care | Payer: Commercial Managed Care - PPO | Source: Ambulatory Visit | Attending: Interventional Radiology | Admitting: Interventional Radiology

## 2019-06-26 ENCOUNTER — Other Ambulatory Visit: Payer: Self-pay

## 2019-06-26 ENCOUNTER — Ambulatory Visit (HOSPITAL_COMMUNITY): Admission: RE | Admit: 2019-06-26 | Payer: Commercial Managed Care - PPO | Source: Ambulatory Visit

## 2019-06-26 DIAGNOSIS — I671 Cerebral aneurysm, nonruptured: Secondary | ICD-10-CM | POA: Insufficient documentation

## 2019-06-26 LAB — POCT I-STAT CREATININE: Creatinine, Ser: 0.8 mg/dL (ref 0.44–1.00)

## 2019-06-26 MED ORDER — GADOBUTROL 1 MMOL/ML IV SOLN
7.0000 mL | Freq: Once | INTRAVENOUS | Status: AC | PRN
  Administered 2019-06-26: 7 mL via INTRAVENOUS

## 2019-06-29 ENCOUNTER — Telehealth (HOSPITAL_COMMUNITY): Payer: Self-pay

## 2019-06-29 NOTE — Telephone Encounter (Signed)
Called, no answer, left vm. AW

## 2019-07-07 ENCOUNTER — Telehealth: Payer: Self-pay | Admitting: Family Medicine

## 2019-07-07 NOTE — Telephone Encounter (Signed)
Noted. AS, CMA 

## 2019-07-07 NOTE — Telephone Encounter (Signed)
I called patient to set up a Virtual visit with PCP to discuss the BP medication issue and patient denied wanting to have a virtual with the provider about this, as she states she will come in for a physical on 08/13/2019.- AM

## 2019-07-07 NOTE — Telephone Encounter (Signed)
Please call patient and schedule either DOXY or telehealth apt to discuss with Dr. Raliegh Scarlet. Thank you

## 2019-07-07 NOTE — Telephone Encounter (Signed)
Patient called, stating that her OBGYN has been prescribing her BP medication for her, but will no longer be prescribing it anymore and didn't know if Dr. Jenetta Downer could prescribe it for her or what she needed to do in regards to this and would like a call back about this, on her work phone. [phone]. Medication Listed Below. Please Advise. AM   hydrochlorothiazide (HYDRODIURIL) 12.5 MG tablet [id number]

## 2019-07-27 ENCOUNTER — Ambulatory Visit: Payer: Commercial Managed Care - PPO | Attending: Internal Medicine

## 2019-07-27 DIAGNOSIS — Z20822 Contact with and (suspected) exposure to covid-19: Secondary | ICD-10-CM

## 2019-07-28 LAB — NOVEL CORONAVIRUS, NAA: SARS-CoV-2, NAA: NOT DETECTED

## 2019-07-31 ENCOUNTER — Other Ambulatory Visit: Payer: Commercial Managed Care - PPO

## 2019-07-31 ENCOUNTER — Other Ambulatory Visit: Payer: Self-pay

## 2019-07-31 DIAGNOSIS — I10 Essential (primary) hypertension: Secondary | ICD-10-CM

## 2019-07-31 DIAGNOSIS — F1010 Alcohol abuse, uncomplicated: Secondary | ICD-10-CM

## 2019-07-31 DIAGNOSIS — E785 Hyperlipidemia, unspecified: Secondary | ICD-10-CM

## 2019-07-31 DIAGNOSIS — R748 Abnormal levels of other serum enzymes: Secondary | ICD-10-CM

## 2019-08-01 LAB — COMPREHENSIVE METABOLIC PANEL
ALT: 37 IU/L — ABNORMAL HIGH (ref 0–32)
AST: 34 IU/L (ref 0–40)
Albumin/Globulin Ratio: 1.7 (ref 1.2–2.2)
Albumin: 4.6 g/dL (ref 3.8–4.8)
Alkaline Phosphatase: 76 IU/L (ref 39–117)
BUN/Creatinine Ratio: 10 — ABNORMAL LOW (ref 12–28)
BUN: 9 mg/dL (ref 8–27)
Bilirubin Total: 0.5 mg/dL (ref 0.0–1.2)
CO2: 25 mmol/L (ref 20–29)
Calcium: 10 mg/dL (ref 8.7–10.3)
Chloride: 103 mmol/L (ref 96–106)
Creatinine, Ser: 0.89 mg/dL (ref 0.57–1.00)
GFR calc Af Amer: 80 mL/min/{1.73_m2} (ref >59)
GFR calc non Af Amer: 70 mL/min/{1.73_m2} (ref >59)
Globulin, Total: 2.7 g/dL (ref 1.5–4.5)
Glucose: 83 mg/dL (ref 65–99)
Potassium: 4 mmol/L (ref 3.5–5.2)
Sodium: 142 mmol/L (ref 134–144)
Total Protein: 7.3 g/dL (ref 6.0–8.5)

## 2019-08-01 LAB — LIPID PANEL
Chol/HDL Ratio: 3.7 ratio (ref 0.0–4.4)
Cholesterol, Total: 202 mg/dL — ABNORMAL HIGH (ref 100–199)
HDL: 55 mg/dL (ref >39)
LDL Chol Calc (NIH): 138 mg/dL — ABNORMAL HIGH (ref 0–99)
Triglycerides: 47 mg/dL (ref 0–149)
VLDL Cholesterol Cal: 9 mg/dL (ref 5–40)

## 2019-08-03 ENCOUNTER — Ambulatory Visit: Payer: Commercial Managed Care - PPO | Attending: Internal Medicine

## 2019-08-03 DIAGNOSIS — Z20822 Contact with and (suspected) exposure to covid-19: Secondary | ICD-10-CM

## 2019-08-04 ENCOUNTER — Ambulatory Visit: Payer: Commercial Managed Care - PPO | Admitting: Family Medicine

## 2019-08-04 LAB — NOVEL CORONAVIRUS, NAA: SARS-CoV-2, NAA: NOT DETECTED

## 2019-08-13 ENCOUNTER — Other Ambulatory Visit: Payer: Self-pay

## 2019-08-13 ENCOUNTER — Ambulatory Visit (INDEPENDENT_AMBULATORY_CARE_PROVIDER_SITE_OTHER): Payer: Commercial Managed Care - PPO | Admitting: Family Medicine

## 2019-08-13 ENCOUNTER — Encounter: Payer: Self-pay | Admitting: Family Medicine

## 2019-08-13 VITALS — BP 146/90 | HR 85 | Temp 97.8°F | Resp 12 | Ht 67.0 in | Wt 177.6 lb

## 2019-08-13 DIAGNOSIS — I639 Cerebral infarction, unspecified: Secondary | ICD-10-CM

## 2019-08-13 DIAGNOSIS — E785 Hyperlipidemia, unspecified: Secondary | ICD-10-CM

## 2019-08-13 DIAGNOSIS — K219 Gastro-esophageal reflux disease without esophagitis: Secondary | ICD-10-CM

## 2019-08-13 DIAGNOSIS — F1010 Alcohol abuse, uncomplicated: Secondary | ICD-10-CM | POA: Diagnosis not present

## 2019-08-13 DIAGNOSIS — G4730 Sleep apnea, unspecified: Secondary | ICD-10-CM | POA: Insufficient documentation

## 2019-08-13 DIAGNOSIS — I10 Essential (primary) hypertension: Secondary | ICD-10-CM | POA: Diagnosis not present

## 2019-08-13 DIAGNOSIS — R748 Abnormal levels of other serum enzymes: Secondary | ICD-10-CM

## 2019-08-13 DIAGNOSIS — G43909 Migraine, unspecified, not intractable, without status migrainosus: Secondary | ICD-10-CM | POA: Insufficient documentation

## 2019-08-13 MED ORDER — HYDROCHLOROTHIAZIDE 12.5 MG PO TABS: 12.5000 mg | ORAL_TABLET | Freq: Every day | ORAL | 1 refills | Status: DC

## 2019-08-13 MED ORDER — OMEPRAZOLE 40 MG PO CPDR: DELAYED_RELEASE_CAPSULE | ORAL | 1 refills | Status: DC

## 2019-08-13 NOTE — Patient Instructions (Addendum)
OMRON series 3 blood pressure machine for the upper arm.  Your goal blood pressure should be 135/85 or less on a regular basis, her medications should be started.  Normal blood pressure is 120/80 or less.    Hypertension Hypertension, commonly called high blood pressure, is when the force of blood pumping through the arteries is too strong. The arteries are the blood vessels that carry blood from the heart throughout the body. Hypertension forces the heart to work harder to pump blood and may cause arteries to become narrow or stiff. Having untreated or uncontrolled hypertension can cause heart attacks, strokes, kidney disease, and other problems. A blood pressure reading consists of a higher number over a lower number. Ideally, your blood pressure should be below 120/80. The first ("top") number is called the systolic pressure. It is a measure of the pressure in your arteries as your heart beats. The second ("bottom") number is called the diastolic pressure. It is a measure of the pressure in your arteries as the heart relaxes. What are the causes? The cause of this condition is not known. What increases the risk? Some risk factors for high blood pressure are under your control. Others are not. Factors you can change  Smoking.  Having type 2 diabetes mellitus, high cholesterol, or both.  Not getting enough exercise or physical activity.  Being overweight.  Having too much fat, sugar, calories, or salt (sodium) in your diet.  Drinking too much alcohol. Factors that are difficult or impossible to change  Having chronic kidney disease.  Having a family history of high blood pressure.  Age. Risk increases with age.  Race. You may be at higher risk if you are African-American.  Gender. Men are at higher risk than women before age 69. After age 27, women are at higher risk than men.  Having obstructive sleep apnea.  Stress. What are the signs or symptoms? Extremely high blood  pressure (hypertensive crisis) may cause:  Headache.  Anxiety.  Shortness of breath.  Nosebleed.  Nausea and vomiting.  Severe chest pain.  Jerky movements you cannot control (seizures).  How is this diagnosed? This condition is diagnosed by measuring your blood pressure while you are seated, with your arm resting on a surface. The cuff of the blood pressure monitor will be placed directly against the skin of your upper arm at the level of your heart. It should be measured at least twice using the same arm. Certain conditions can cause a difference in blood pressure between your right and left arms. Certain factors can cause blood pressure readings to be lower or higher than normal (elevated) for a short period of time:  When your blood pressure is higher when you are in a health care provider's office than when you are at home, this is called white coat hypertension. Most people with this condition do not need medicines.  When your blood pressure is higher at home than when you are in a health care provider's office, this is called masked hypertension. Most people with this condition may need medicines to control blood pressure.  If you have a high blood pressure reading during one visit or you have normal blood pressure with other risk factors:  You may be asked to return on a different day to have your blood pressure checked again.  You may be asked to monitor your blood pressure at home for 1 week or longer.  If you are diagnosed with hypertension, you may have other blood or imaging  tests to help your health care provider understand your overall risk for other conditions. How is this treated? This condition is treated by making healthy lifestyle changes, such as eating healthy foods, exercising more, and reducing your alcohol intake. Your health care provider may prescribe medicine if lifestyle changes are not enough to get your blood pressure under control, and if:  Your  systolic blood pressure is above 130.  Your diastolic blood pressure is above 80.  Your personal target blood pressure may vary depending on your medical conditions, your age, and other factors. Follow these instructions at home: Eating and drinking  Eat a diet that is high in fiber and potassium, and low in sodium, added sugar, and fat. An example eating plan is called the DASH (Dietary Approaches to Stop Hypertension) diet. To eat this way: ? Eat plenty of fresh fruits and vegetables. Try to fill half of your plate at each meal with fruits and vegetables. ? Eat whole grains, such as whole wheat pasta, brown rice, or whole grain bread. Fill about one quarter of your plate with whole grains. ? Eat or drink low-fat dairy products, such as skim milk or low-fat yogurt. ? Avoid fatty cuts of meat, processed or cured meats, and poultry with skin. Fill about one quarter of your plate with lean proteins, such as fish, chicken without skin, beans, eggs, and tofu. ? Avoid premade and processed foods. These tend to be higher in sodium, added sugar, and fat.  Reduce your daily sodium intake. Most people with hypertension should eat less than 1,500 mg of sodium a day.  Limit alcohol intake to no more than 1 drink a day for nonpregnant women and 2 drinks a day for men. One drink equals 12 oz of beer, 5 oz of wine, or 1 oz of hard liquor. Lifestyle  Work with your health care provider to maintain a healthy body weight or to lose weight. Ask what an ideal weight is for you.  Get at least 30 minutes of exercise that causes your heart to beat faster (aerobic exercise) most days of the week. Activities may include walking, swimming, or biking.  Include exercise to strengthen your muscles (resistance exercise), such as pilates or lifting weights, as part of your weekly exercise routine. Try to do these types of exercises for 30 minutes at least 3 days a week.  Do not use any products that contain nicotine or  tobacco, such as cigarettes and e-cigarettes. If you need help quitting, ask your health care provider.  Monitor your blood pressure at home as told by your health care provider.  Keep all follow-up visits as told by your health care provider. This is important. Medicines  Take over-the-counter and prescription medicines only as told by your health care provider. Follow directions carefully. Blood pressure medicines must be taken as prescribed.  Do not skip doses of blood pressure medicine. Doing this puts you at risk for problems and can make the medicine less effective.  Ask your health care provider about side effects or reactions to medicines that you should watch for. Contact a health care provider if:  You think you are having a reaction to a medicine you are taking.  You have headaches that keep coming back (recurring).  You feel dizzy.  You have swelling in your ankles.  You have trouble with your vision. Get help right away if:  You develop a severe headache or confusion.  You have unusual weakness or numbness.  You feel faint.  You have severe pain in your chest or abdomen.  You vomit repeatedly.  You have trouble breathing. Summary  Hypertension is when the force of blood pumping through your arteries is too strong. If this condition is not controlled, it may put you at risk for serious complications.  Your personal target blood pressure may vary depending on your medical conditions, your age, and other factors. For most people, a normal blood pressure is less than 120/80.  Hypertension is treated with lifestyle changes, medicines, or a combination of both. Lifestyle changes include weight loss, eating a healthy, low-sodium diet, exercising more, and limiting alcohol. This information is not intended to replace advice given to you by your health care provider. Make sure you discuss any questions you have with your health care provider. Document Released:  06/25/2005 Document Revised: 05/23/2016 Document Reviewed: 05/23/2016 Elsevier Interactive Patient Education  2018 [employer].    How to Take Your Blood Pressure   Blood pressure is a measurement of how strongly your blood is pressing against the walls of your arteries. Arteries are blood vessels that carry blood from your heart throughout your body. Your health care provider takes your blood pressure at each office visit. You can also take your own blood pressure at home with a blood pressure machine. You may need to take your own blood pressure:  To confirm a diagnosis of high blood pressure (hypertension).  To monitor your blood pressure over time.  To make sure your blood pressure medicine is working.  Supplies needed: To take your blood pressure, you will need a blood pressure machine. You can buy a blood pressure machine, or blood pressure monitor, at most drugstores or online. There are several types of home blood pressure monitors. When choosing one, consider the following:  Choose a monitor that has an arm cuff.  Choose a monitor that wraps snugly around your upper arm. You should be able to fit only one finger between your arm and the cuff.  Do not choose a monitor that measures your blood pressure from your wrist or finger.  Your health care provider can suggest a reliable monitor that will meet your needs. How to prepare To get the most accurate reading, avoid the following for 30 minutes before you check your blood pressure:  Drinking caffeine.  Drinking alcohol.  Eating.  Smoking.  Exercising.  Five minutes before you check your blood pressure:  Empty your bladder.  Sit quietly without talking in a dining chair, rather than in a soft couch or armchair.  How to take your blood pressure To check your blood pressure, follow the instructions in the manual that came with your blood pressure monitor. If you have a digital blood pressure monitor, the  instructions may be as follows: 1. Sit up straight. 2. Place your feet on the floor. Do not cross your ankles or legs. 3. Rest your left arm at the level of your heart on a table or desk or on the arm of a chair. 4. Pull up your shirt sleeve. 5. Wrap the blood pressure cuff around the upper part of your left arm, 1 inch (2.5 cm) above your elbow. It is best to wrap the cuff around bare skin. 6. Fit the cuff snugly around your arm. You should be able to place only one finger between the cuff and your arm. 7. Position the cord inside the groove of your elbow. 8. Press the power button. 9. Sit quietly while the cuff inflates and deflates. 10.  Read the digital reading on the monitor screen and write it down (record it). 11. Wait 2-3 minutes, then repeat the steps, starting at step 1.  What does my blood pressure reading mean? A blood pressure reading consists of a higher number over a lower number. Ideally, your blood pressure should be below 120/80. The first ("top") number is called the systolic pressure. It is a measure of the pressure in your arteries as your heart beats. The second ("bottom") number is called the diastolic pressure. It is a measure of the pressure in your arteries as the heart relaxes. Blood pressure is classified into four stages. The following are the stages for adults who do not have a short-term serious illness or a chronic condition. Systolic pressure and diastolic pressure are measured in a unit called mm Hg. Normal  Systolic pressure: below 123456.  Diastolic pressure: below 80. Elevated  Systolic pressure: [id number].  Diastolic pressure: below 80. Hypertension stage 1  Systolic pressure: 0000000.  Diastolic pressure: [id number]. Hypertension stage 2  Systolic pressure: [id number] or above.  Diastolic pressure: 90 or above. You can have prehypertension or hypertension even if only the systolic or only the diastolic number in your reading is higher than normal. Follow  these instructions at home:  Check your blood pressure as often as recommended by your health care provider.  Take your monitor to the next appointment with your health care provider to make sure: ? That you are using it correctly. ? That it provides accurate readings.  Be sure you understand what your goal blood pressure numbers are.  Tell your health care provider if you are having any side effects from blood pressure medicine. Contact a health care provider if:  Your blood pressure is consistently high. Get help right away if:  Your systolic blood pressure is higher than 180.  Your diastolic blood pressure is higher than 110. This information is not intended to replace advice given to you by your health care provider. Make sure you discuss any questions you have with your health care provider. Document Released: 12/02/2015 Document Revised: 02/14/2016 Document Reviewed: 12/02/2015 Elsevier Interactive Patient Education  2018 [place] for a Low Cholesterol, Low Saturated Fat Diet   Fats - Limit total intake of fats and oils. - Avoid butter, stick margarine, shortening, lard, palm and coconut oils. - Limit mayonnaise, salad dressings, gravies and sauces, unless they are homemade with low-fat ingredients. - Limit chocolate. - Choose low-fat and nonfat products, such as low-fat mayonnaise, low-fat or non-hydrogenated peanut butter, low-fat or fat-free salad dressings and nonfat gravy. - Use vegetable oil, such as canola or olive oil. - Look for margarine that does not contain trans fatty acids. - Use nuts in moderate amounts. - Read ingredient labels carefully to determine both amount and type of fat present in foods. Limit saturated and trans fats! - Avoid high-fat processed and convenience foods.  Meats and Meat Alternatives - Choose fish, chicken, [place] and lean meats. - Use dried beans, peas, lentils and tofu. - Limit egg yolks to three to four per  week. - If you eat red meat, limit to no more than three servings per week and choose loin or round cuts. - Avoid fatty meats, such as bacon, sausage, franks, luncheon meats and ribs. - Avoid all organ meats, including liver.  Dairy - Choose nonfat or low-fat milk, yogurt and cottage cheese. - Most cheeses are high in fat. Choose cheeses made from non-fat milk, such as  mozzarella and ricotta cheese. - Choose light or fat-free cream cheese and sour cream. - Avoid cream and sauces made with cream.  Fruits and Vegetables - Eat a wide variety of fruits and vegetables. - Use lemon juice, vinegar or "mist" olive oil on vegetables. - Avoid adding sauces, fat or oil to vegetables.  Breads, Cereals and Grains - Choose whole-grain breads, cereals, pastas and rice. - Avoid high-fat snack foods, such as granola, cookies, pies, pastries, doughnuts and croissants.  Cooking Tips - Avoid deep fried foods. - Trim visible fat off meats and remove skin from poultry before cooking. - Bake, broil, boil, poach or roast poultry, fish and lean meats. - Drain and discard fat that drains out of meat as you cook it. - Add little or no fat to foods. - Use vegetable oil sprays to grease pans for cooking or baking. - Steam vegetables. - Use herbs or no-oil marinades to flavor foods.  Nine ways to increase your "good" HDL cholesterol  High-density lipoprotein (HDL) is often referred to as the "good" cholesterol. Having high HDL levels helps carry cholesterol from your arteries to your liver, where it can be used or excreted.  Having high levels of HDL also has antioxidant and anti-inflammatory effects, and is linked to a reduced risk of heart disease (1, 2).  Most health experts recommend minimum blood levels of 40 mg/dl in men and 50 mg/dl in women.  While genetics definitely play a role, there are several other factors that affect HDL levels.  Here are nine healthy ways to raise your "good" HDL  cholesterol.  1. Consume olive oil  two pieces of salmon on a plate olive oil being poured into a small dish Extra virgin olive oil may be more healthful than processed olive oils. Olive oil is one of the healthiest fats around.  A large analysis of 42 studies with more than 800,000 participants found that olive oil was the only source of monounsaturated fat that seemed to reduce heart disease risk (3).  Research has shown that one of olive oil's heart-healthy effects is an increase in HDL cholesterol. This effect is thought to be caused by antioxidants it contains called polyphenols (4, 5, 6, 7).  Extra virgin olive oil has more polyphenols than more processed olive oils, although the amount can still vary among different types and brands.  One study gave 200 healthy young men about 2 tablespoons (25 ml) of different olive oils per day for three weeks.  The researchers found that participants' HDL levels increased significantly more after they consumed the olive oil with the highest polyphenol content (6).  In another study, when 62 older adults consumed about 4 tablespoons (50 ml) of high-polyphenol extra virgin olive oil every day for six weeks, their HDL cholesterol increased by 6.5 mg/dl, on average (7).  In addition to raising HDL levels, olive oil has been found to boost HDL's anti-inflammatory and antioxidant function in studies of older people and individuals with high cholesterol levels ( 7, 8, 9).  Whenever possible, select high-quality, certified extra virgin olive oils, which tend to be highest in polyphenols.  Bottom line: Extra virgin olive oil with a high polyphenol content has been shown to increase HDL levels in healthy people, the elderly and individuals with high cholesterol.  2. Follow a low-carb or ketogenic diet  Low-carb and ketogenic diets provide a number of health benefits, including weight loss and reduced blood sugar levels.  They have also been shown to  increase  HDL cholesterol in people who tend to have lower levels.  This includes those who are obese, insulin resistant or diabetic (10, 11, 12, 13, 14, 15, 16, 17).  In one study, people with type 2 diabetes were split into two groups.  One followed a diet consuming less than 50 grams of carbs per day. The other followed a high-carb diet.  Although both groups lost weight, the low-carb group's HDL cholesterol increased almost twice as much as the high-carb group's did (14).  In another study, obese people who followed a low-carb diet experienced an increase in HDL cholesterol of 5 mg/dl overall.  Meanwhile, in the same study, the participants who ate a low-fat, high-carb diet showed a decrease in HDL cholesterol (15).  This response may partially be due to the higher levels of fat people typically consume on low-carb diets.  One study in overweight women found that diets high in meat and cheese increased HDL levels by 5-8%, compared to a higher-carb diet (18).  What's more, in addition to raising HDL cholesterol, very-low-carb diets have been shown to decrease triglycerides and improve several other risk factors for heart disease (13, 14, 16, 17).  Bottom line: Low-carb and ketogenic diets typically increase HDL cholesterol levels in people with diabetes, metabolic syndrome and obesity.  3. Exercise regularly  Being physically active is important for heart health.  Studies have shown that many different types of exercise are effective at raising HDL cholesterol, including strength training, high-intensity exercise and aerobic exercise (19, 20, 21, 22, 23, 24).  However, the biggest increases in HDL are typically seen with high-intensity exercise.  One small study followed women who were living with polycystic ovary syndrome (PCOS), which is linked to a higher risk of insulin resistance. The study required them to perform high-intensity exercise three times a week.  The exercise led  to an increase in HDL cholesterol of 8 mg/dL after 10 weeks. The women also showed improvements in other health markers, including decreased insulin resistance and improved arterial function (23).  In a 12-week study, overweight men who performed high-intensity exercise experienced a 10% increase in HDL cholesterol.  In contrast, the low-intensity exercise group showed only a 2% increase and the endurance training group experienced no change (24).  However, even lower-intensity exercise seems to increase HDL's anti-inflammatory and antioxidant capacities, whether or not HDL levels change (20, 21, 25).  Overall, high-intensity exercise such as high-intensity interval training (HIIT) and high-intensity circuit training (HICT) may boost HDL cholesterol levels the most.  Bottom line: Exercising several times per week can help raise HDL cholesterol and enhance its anti-inflammatory and antioxidant effects. High-intensity forms of exercise may be especially effective.  4. Add coconut oil to your diet  Studies have shown that coconut oil may reduce appetite, increase metabolic rate and help protect brain health, among other benefits.  Some people may be concerned about coconut oil's effects on heart health due to its high saturated fat content.  However, it appears that coconut oil is actually quite heart healthy.  Coconut oil tends to raise HDL cholesterol more than many other types of fat.  In addition, it may improve the ratio of low-density-lipoprotein (LDL) cholesterol, the "bad" cholesterol, to HDL cholesterol. Improving this ratio reduces heart disease risk (26, 27, 28, 29).  One study examined the health effects of coconut oil on 46 women with excess belly fat. The researchers found that participants who took coconut oil daily experienced increased HDL cholesterol and a lower LDL-to-HDL ratio.  In contrast, the group who took soybean oil daily had a decrease in HDL cholesterol and an  increase in the LDL-to-HDL ratio (29).  Most studies have found these health benefits occur at a dosage of about 2 tablespoons (30 ml) of coconut oil per day. It's best to incorporate this into cooking rather than eating spoonfuls of coconut oil on their own.  Bottom line: Consuming 2 tablespoons (30 ml) of coconut oil per day may help increase HDL cholesterol levels.  5. Stop smoking  cigarette butt Quitting smoking can reduce the risk of heart disease and lung cancer. Smoking increases the risk of many health problems, including heart disease and lung cancer (30).  One of its negative effects is a suppression of HDL cholesterol.  Some studies have found that quitting smoking can increase HDL levels. Indeed, one study found no significant differences in HDL levels between former smokers and people who had never smoked (31, 32, 33, 34, 35).  In a one-year study of more than 1,500 people, those who quit smoking had twice the increase in HDL as those who resumed smoking within the year. The number of large HDL particles also increased, which further reduced heart disease risk (32).  One study followed smokers who switched from traditional cigarettes to electronic cigarettes for one year. They found that the switch was associated with an increase in HDL cholesterol of 5 mg/dl, on average (33).  When it comes to the effect of nicotine replacement patches on HDL levels, research results have been mixed.  One study found that nicotine replacement therapy led to higher HDL cholesterol. However, other research suggests that people who use nicotine patches likely won't see increases in HDL levels until after replacement therapy is completed (34, 36).  Even in studies where HDL cholesterol levels didn't increase after people quit smoking, HDL function improved, resulting in less inflammation and other beneficial effects on heart health (37).  Bottom line: Quitting smoking can increase HDL levels,  improve HDL function and help protect heart health.  6. Lose weight  When overweight and obese people lose weight, their HDL cholesterol levels usually increase.  What's more, this benefit seems to occur whether weight loss is achieved by calorie counting, carb restriction, intermittent fasting, weight loss surgery or a combination of diet and exercise (16, 38, 39, 40, 41, 42).  One study examined HDL levels in more than 3,000 overweight and obese [place] adults who followed a lifestyle modification program for one year.  The researchers found that losing at least 6.6 lbs (3 kg) led to an increase in HDL cholesterol of 4 mg/dl, on average (41).  In another study, when obese people with type 2 diabetes consumed calorie-restricted diets that provided 20-30% of calories from protein, they experienced significant increases in HDL cholesterol levels (42).  The key to achieving and maintaining healthy HDL cholesterol levels is choosing the type of diet that makes it easiest for you to lose weight and keep it off.  Bottom Line: Several methods of weight loss have been shown to increase HDL cholesterol levels in people who are overweight or obese.  7. Choose purple produce  Consuming purple-colored fruits and vegetables is a delicious way to potentially increase HDL cholesterol.  Purple produce contains antioxidants known as anthocyanins.  Studies using anthocyanin extracts have shown that they help fight inflammation, protect your cells from damaging free radicals and may also raise HDL cholesterol levels (43, 44, 45, 46).  In a 24-week study of 20 people with  diabetes, those who took an anthocyanin supplement twice a day experienced a 19% increase in HDL cholesterol, on average, along with other improvements in heart health markers (45).  In another study, when people with cholesterol issues took anthocyanin extract for 12 weeks, their HDL cholesterol levels increased by 13.7%  (46).  Although these studies used extracts instead of foods, there are several fruits and vegetables that are very high in anthocyanins. These include eggplant, purple corn, red cabbage, blueberries, blackberries and black raspberries.  Bottom line: Consuming fruits and vegetables rich in anthocyanins may help increase HDL cholesterol levels.  8. Eat fatty fish often  The omega-3 fats in fatty fish provide major benefits to heart health, including a reduction in inflammation and better functioning of the cells that line your arteries (47, 48).  There's some research showing that eating fatty fish or taking fish oil may also help raise low levels of HDL cholesterol (49, 50, 51, 52, 53).  In a study of 33 heart disease patients, participants that consumed fatty fish four times per week experienced an increase in HDL cholesterol levels. The particle size of their HDL also increased (52).  In another study, overweight men who consumed herring five days a week for six weeks had a 5% increase in HDL cholesterol, compared with their levels after eating lean pork and chicken five days a week (53).  However, there are a few studies that found no increase in HDL cholesterol in response to increased fish or omega-3 supplement intake (54, 55).  In addition to herring, other types of fatty fish that may help raise HDL cholesterol include salmon, sardines, mackerel and anchovies.  Bottom line: Eating fatty fish several times per week may help increase HDL cholesterol levels and provide other benefits to heart health.  9. Avoid artificial trans fats  Artificial trans fats have many negative health effects due to their inflammatory properties (56, 57).  There are two types of trans fats. One kind occurs naturally in animal products, including full-fat dairy.  In contrast, the artificial trans fats found in margarines and processed foods are created by adding hydrogen to unsaturated vegetable and seed  oils. These fats are also known as industrial trans fats or partially hydrogenated fats.  Research has shown that, in addition to increasing inflammation and contributing to several health problems, these artificial trans fats may lower HDL cholesterol levels.  In one study, researchers compared how people's HDL levels responded when they consumed different margarines.  The study found that participants' HDL cholesterol levels were 10% lower after consuming margarine containing partially hydrogenated soybean oil, compared to their levels after consuming palm oil (58).  Another controlled study followed 40 adults who had diets high in different types of trans fats.  They found that HDL cholesterol levels in women were significantly lower after they consumed the diet high in industrial trans fats, compared to the diet containing naturally occurring trans fats (59).  To protect heart health and keep HDL cholesterol in the healthy range, it's best to avoid artificial trans fats altogether.  Bottom line: Artificial trans fats have been shown to lower HDL levels and increase inflammation, compared to other fats.  Take home message  Although your HDL cholesterol levels are partly determined by your genetics, there are many things you can do to naturally increase your own levels.  Fortunately, the practices that raise HDL cholesterol often provide other health benefits as well.

## 2019-08-13 NOTE — Progress Notes (Signed)
Impression and Recommendations:    1. HTN, goal below 130/80   2. Hyperlipidemia, unspecified hyperlipidemia type   3. Excessive drinking alcohol   4. Elevated liver enzymes   5. Cerebrovascular event ([place])   6. Gastroesophageal reflux disease, unspecified whether esophagitis present      Elevated Liver Enzymes, Excessive Drinking Alcohol - Improved from Prior - AST = 34, down from 55 prior. - ALT = 37, down from 79 prior.  - Encouraged patient to continue reducing her alcohol use and working on prudent health habits.  - Will continue to monitor.    Hyperlipidemia, history of Cerebrovascular Event  - Advised patient to start Lipitor in September of 2020.  Prescription provided at that time.  Today is f/up OV.   -  Per patient she decided on her own to not take the medication and work on lifestyle instead.  Last FLP obtained 13 days ago. - Triglycerides = 47, down from 75 prior. - HDL = 55, down from 63 prior. - LDL = 138, elevated, up from 149 prior.  - The 10-year ASCVD risk score Mikey Bussing DC Jr., et al., 2013) is: 6.9%  - Given patient's ASCVD risk and history of stroke, STRONGLY advised patient to begin medication.  Patient continues to decline medication despite education and recommendations.  - In addition to the goal of prescription intervention, advised patient to continue dietary changes such as low saturated & trans fat diets for hyperlipidemia and low carb diets for hypertriglyceridemia.    - Encouraged patient to follow AHA guidelines for regular exercise and also engage in prudent weight loss if BMI above 25.   - Educational handouts provided at patient's desire and/ or told to look online at the [facility] website for further information.  - We will continue to monitor and re-check as discussed.    Hypertension, goal below 130/80 - Blood pressure currently is elevated in office today.  - Per patient, has been using a wrist monitor  at home. - Encouraged patient to obtain an upper arm blood pressure cuff to begin monitoring her BP more accurately.  - Patient will continue current treatment regimen.  See med list.  - Counseled patient on pathophysiology of disease and discussed various treatment options, which always includes dietary and lifestyle modification as first line.   - Lifestyle changes such as dash and heart healthy diets and engaging in a regular exercise program discussed extensively with patient.   - Ambulatory blood pressure monitoring encouraged at least 3 times weekly.  Keep log and bring in every office visit.  Reminded patient that if they ever feel poorly in any way, to check their blood pressure and pulse.  - Handouts provided at patient's desire and/or told to go online at the [facility] website for further information.  - We will continue to monitor    [place] patient to continue working toward exercising to improve overall mental, physical, and emotional health.    - Reviewed the "spokes of the wheel" of mood and health management.  Stressed the importance of ongoing prudent habits, including regular exercise, appropriate sleep hygiene, healthful dietary habits, and prayer/meditation to relax.  - Encouraged patient to engage in daily physical activity, especially a formal exercise routine.  Recommended that the patient eventually strive for at least 150 minutes of moderate cardiovascular activity per week according to guidelines established by the [facility].   - Healthy dietary habits encouraged, including  low-carb, and high amounts of lean protein in diet.   - Patient should also consume adequate amounts of water.  - Health counseling performed.  All questions answered.   Meds ordered this encounter  Medications  . hydrochlorothiazide (HYDRODIURIL) 12.5 MG tablet    Sig: Take 1 tablet (12.5 mg total) by mouth daily.    Dispense:   90 tablet    Refill:  1  . omeprazole (PRILOSEC) 40 MG capsule    Sig: TAKE 1 CAPSULE(40 MG) BY MOUTH DAILY    Dispense:  90 capsule    Refill:  1    Please ask pt to schedule an appt for future refills. Thank you. [phone]    Medications Discontinued During This Encounter  Medication Reason  . hydrochlorothiazide (HYDRODIURIL) 12.5 MG tablet Reorder  . omeprazole (PRILOSEC) 40 MG capsule Reorder     Gross side effects, risk and benefits, and alternatives of medications and treatment plan in general discussed with patient.  Patient is aware that all medications have potential side effects and we are unable to predict every side effect or drug-drug interaction that may occur.   Patient will call with any questions prior to using medication if they have concerns.    Expresses verbal understanding and consents to current therapy and treatment regimen.  No barriers to understanding were identified.  Red flag symptoms and signs discussed in detail.  Patient expressed understanding regarding what to do in case of emergency\urgent symptoms  Please see AVS handed out to patient at the end of our visit for further patient instructions/ counseling done pertaining to today's office visit.   Return for f/up 4 months with FLP, ALT, AST, with OV one week later.     Note:  This note was prepared with assistance of Dragon voice recognition software. Occasional wrong-word or sound-a-like substitutions may have occurred due to the inherent limitations of voice recognition software.   This document serves as a record of services personally performed by Mellody Dance, DO. It was created on her behalf by Toni Amend, a trained medical scribe. The creation of this record is based on the scribe's personal observations and the provider's statements to them.   This case required medical decision making of at least moderate complexity. The above documentation from Toni Amend, medical scribe,  has been reviewed by Marjory Sneddon, D.O.    --------------------------------------------------------------------------------------------------------------------------------------------------------------------------------------------------------------------------------------------  Subjective:    HPI: Shannon Yu is a 64 y.o. female who presents to [place] [facility] today for issues as discussed below.   - Physical Activity Per patient, exercises every day.  Pre-COVID, she played tennis.  Notes she exercises every morning and has added weights, but that her current exercise routine is "not like tennis or when I work out at [employer]."   - Liver Enzymes To help improve her liver enzymes, notes she quit drinking alcohol completely during the week.  Now she has scaled back to wine on the weekends.  Notes she drinks kombucha during the week.   - OSA on CPAP Currently managed on CPAP daily.  Has been assessed by a specialist.   - History of Stroke/Ruptured Brain Aneurysm In addition, notes she went for a brain scan and notes that everything was normal.  States she plans to repeat this every two years.  She has eliminated hormone use and notes she desires to continue to exercise instead of using statins.   HPI:  Hyperlipidemia: 64 y.o. female here for cholesterol  follow-up.  Notes that she wanted to try to improve her cholesterol by modifying her lifestyle without using medications.  She has worked on her lifestyle for the past four months and has not taken any statins.  Notes her concern is that the statin will cause pain with her osteoarthritis.  - She denies new onset of: myalgias, arthralgias, increased fatigue more than normal, chest pains, exercise intolerance, shortness of breath, dizziness, visual changes, headache, lower extremity swelling or claudication.   Most recent cholesterol panel was:  Lab Results  Component Value Date   CHOL 202 (H)  07/31/2019   HDL 55 07/31/2019   LDLCALC 138 (H) 07/31/2019   TRIG 47 07/31/2019   CHOLHDL 3.7 07/31/2019   Hepatic Function Latest Ref Rng & Units 07/31/2019 03/31/2019 08/08/2018  Total Protein 6.0 - 8.5 g/dL 7.3 6.9 -  Albumin 3.8 - 4.8 g/dL 4.6 4.3 -  AST 0 - 40 IU/L 34 55(H) 26  ALT 0 - 32 IU/L 37(H) 79(H) 30  Alk Phosphatase 39 - 117 IU/L 76 90 81  Total Bilirubin 0.0 - 1.2 mg/dL 0.5 0.4 -  Bilirubin, Direct 0.0 - 0.3 mg/dL - - -     HPI:  Hypertension - History of Stroke -  Her blood pressure at home has been running: "Not high at home."  On average, at home, states her BP is running 103/66 since September 25th.  She uses a wrist monitor.  Notes she needs her blood pressure medications managed by the clinic here.  - She denies new onset of: chest pain, exercise intolerance, shortness of breath, dizziness, visual changes, headache, lower extremity swelling or claudication.    Last 3 blood pressure readings in our office are as follows: BP Readings from Last 3 Encounters:  08/13/19 (!) 146/90  04/03/19 128/76  03/18/19 (!) 151/98   Filed Weights   08/13/19 0836  Weight: 177 lb 9.6 oz (80.6 kg)    Wt Readings from Last 3 Encounters:  08/13/19 177 lb 9.6 oz (80.6 kg)  04/03/19 186 lb (84.4 kg)  03/18/19 181 lb 9.6 oz (82.4 kg)   BP Readings from Last 3 Encounters:  08/13/19 (!) 146/90  04/03/19 128/76  03/18/19 (!) 151/98   Pulse Readings from Last 3 Encounters:  08/13/19 85  04/03/19 69  03/18/19 77   BMI Readings from Last 3 Encounters:  08/13/19 27.82 kg/m  04/03/19 29.13 kg/m  03/18/19 28.44 kg/m     Patient Care Team    Relationship Specialty Notifications Start End  Mellody Dance, DO PCP - General Family Medicine  03/18/19   Glory Buff, MD  Gynecology  01/16/13   Luanne Bras, MD  Radiology  01/16/13   Leeroy Cha, MD  Neurosurgery  01/16/13   Lafayette Dragon, MD (Inactive)  Gastroenterology  01/16/13   Deneise Lever, MD  Sleep  Medicine  01/16/13   Clent Jacks, MD  Ophthalmology  06/07/14      Patient Active Problem List   Diagnosis Date Noted  . Hyperlipidemia 04/03/2019  . Prediabetes- elevated A1c 03/31/2019  . HTN, goal below 130/80 03/18/2019  . Brain aneurysm 02/10/2014  . Excessive drinking alcohol 04/03/2019  . Elevated liver enzymes 04/03/2019  . SAH (subarachnoid hemorrhage) ([place]) 05/10/2012  . Vitamin D deficiency 04/03/2019  . OSA on CPAP 03/18/2019  . Insomnia 11/05/2017  . Obstructive sleep apnea 06/30/2011  . Migraine 08/13/2019  . Sleep apnea 08/13/2019  . Gastroesophageal reflux disease 08/13/2019  . Heart palpitations 03/18/2019  .  Routine general medical examination at a health care facility 09/23/2015  . Essential hypertension 09/23/2015  . Osteoarthritis   . 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  . Intracranial aneurysm 05/09/2012  . Seasonal and perennial allergic rhinitis 11/22/2008    Past Medical history, Surgical history, Family history, Social history, Allergies and Medications have been entered into the medical record, reviewed and changed as needed.    Current Meds  Medication Sig  . aspirin 325 MG tablet Take 325 mg by mouth daily.  . cetirizine (ZYRTEC) 10 MG tablet Take 10 mg by mouth daily.  . hydrochlorothiazide (HYDRODIURIL) 12.5 MG tablet Take 1 tablet (12.5 mg total) by mouth daily.  . Omega-3 Fatty Acids (FISH OIL PO) Take 1 capsule by mouth daily.  Marland Kitchen omeprazole (PRILOSEC) 40 MG capsule TAKE 1 CAPSULE(40 MG) BY MOUTH DAILY  . VITAMIN D, CHOLECALCIFEROL, PO Take 25 mcg by mouth daily.  Marland Kitchen zolpidem (AMBIEN) 5 MG tablet Take 1 tablet (5 mg total) by mouth at bedtime as needed for sleep.  . [DISCONTINUED] hydrochlorothiazide (HYDRODIURIL) 12.5 MG tablet TK 1 T PO QD  . [DISCONTINUED] omeprazole (PRILOSEC) 40 MG capsule TAKE 1 CAPSULE(40 MG) BY MOUTH DAILY    Allergies:  No Known Allergies   Review of Systems:  A fourteen system review of systems was  performed and found to be positive as per HPI.   Objective:   Blood pressure (!) 146/90, pulse 85, temperature 97.8 F (36.6 C), temperature source Oral, resp. rate 12, height _0  (1.702 m), weight 177 lb 9.6 oz (80.6 kg), SpO2 99 %. Body mass index is 27.82 kg/m. General:  Well Developed, well nourished, appropriate for stated age.  Neuro:  Alert and oriented,  extra-ocular muscles intact  HEENT:  Normocephalic, atraumatic, neck supple, no carotid bruits appreciated  Skin:  no gross rash, warm, pink. Cardiac:  RRR, S1 S2 Respiratory:  ECTA B/L and A/P, Not using accessory muscles, speaking in full sentences- unlabored. Vascular:  Ext warm, no cyanosis apprec.; cap RF less 2 sec. Psych:  No HI/SI, judgement and insight good, Euthymic mood. Full Affect.

## 2019-09-06 ENCOUNTER — Ambulatory Visit: Payer: Commercial Managed Care - PPO | Attending: Internal Medicine

## 2019-09-06 DIAGNOSIS — Z23 Encounter for immunization: Secondary | ICD-10-CM | POA: Insufficient documentation

## 2019-09-06 NOTE — Progress Notes (Signed)
   Covid-19 Vaccination Clinic  Name:  Shannon Yu    MRN: [id number] DOB: [birth year]  09/06/2019  [name] was observed post Covid-19 immunization for 15 minutes without incidence. She was provided with Vaccine Information Sheet and instruction to access the V-Safe system.   [name] was instructed to call 911 with any severe reactions post vaccine: Marland Kitchen Difficulty breathing  . Swelling of your face and throat  . A fast heartbeat  . A bad rash all over your body  . Dizziness and weakness    Immunizations Administered    Name Date Dose VIS Date Route   Pfizer COVID-19 Vaccine 09/06/2019 11:49 AM 0.3 mL 06/19/2019 Intramuscular   Manufacturer: [place]   Lot: [id number]   [place]: [id number]

## 2019-09-29 ENCOUNTER — Ambulatory Visit: Payer: Commercial Managed Care - PPO | Attending: Internal Medicine

## 2019-09-29 DIAGNOSIS — Z23 Encounter for immunization: Secondary | ICD-10-CM

## 2019-09-29 NOTE — Progress Notes (Signed)
   Covid-19 Vaccination Clinic  Name:  Shannon Yu    MRN: [id number] DOB: 1955/11/09  09/29/2019  [name] was observed post Covid-19 immunization for 15 minutes without incident. She was provided with Vaccine Information Sheet and instruction to access the V-Safe system.   [name] was instructed to call 911 with any severe reactions post vaccine: Marland Kitchen Difficulty breathing  . Swelling of face and throat  . A fast heartbeat  . A bad rash all over body  . Dizziness and weakness   Immunizations Administered    Name Date Dose VIS Date Route   Pfizer COVID-19 Vaccine 09/29/2019  3:14 PM 0.3 mL 06/19/2019 Intramuscular   Manufacturer: [place]   Lot: [id number]   [place]: [id number]

## 2019-11-24 ENCOUNTER — Other Ambulatory Visit: Payer: Self-pay | Admitting: Internal Medicine

## 2019-11-24 MED ORDER — ZOLPIDEM TARTRATE 5 MG PO TABS: 5.0000 mg | ORAL_TABLET | Freq: Every evening | ORAL | 5 refills | Status: DC | PRN

## 2019-12-08 ENCOUNTER — Other Ambulatory Visit: Payer: Self-pay

## 2019-12-08 ENCOUNTER — Other Ambulatory Visit: Payer: Commercial Managed Care - PPO

## 2019-12-08 DIAGNOSIS — E785 Hyperlipidemia, unspecified: Secondary | ICD-10-CM

## 2019-12-08 DIAGNOSIS — R748 Abnormal levels of other serum enzymes: Secondary | ICD-10-CM

## 2019-12-09 LAB — ALT: ALT: 50 IU/L — ABNORMAL HIGH (ref 0–32)

## 2019-12-09 LAB — LIPID PANEL
Chol/HDL Ratio: 3.3 ratio (ref 0.0–4.4)
Cholesterol, Total: 211 mg/dL — ABNORMAL HIGH (ref 100–199)
HDL: 64 mg/dL (ref >39)
LDL Chol Calc (NIH): 134 mg/dL — ABNORMAL HIGH (ref 0–99)
Triglycerides: 71 mg/dL (ref 0–149)
VLDL Cholesterol Cal: 13 mg/dL (ref 5–40)

## 2019-12-09 LAB — AST: AST: 41 IU/L — ABNORMAL HIGH (ref 0–40)

## 2019-12-10 NOTE — Progress Notes (Signed)
Established Patient Office Visit  Subjective:  Patient ID: Shannon Yu, female    DOB: [birth year]  Age: 64 y.o. MRN: [id number]  CC:  Chief Complaint  Patient presents with  . Hypertension  . Hyperlipidemia    HPI Shannon Yu presents for 4 month chronic follow-up on hypertension, hyperlipidemia, and GERD.  HTN: Pt denies chest pain, palpitations, dizziness or lower extremity swelling. Taking medication as directed without side effects. Pt follows a low salt diet. Pt plans to resume her moderate-rigorous intensity physical activity.    HLD: Pt hasn't started taking Atorvastatin yet due to possible side effects and has been trying to control elevated cholesterol with diet.   GERD: Stable.  Elevated liver enzymes: Pt states she had stopped drinking alcohol but resumed and states that's why her liver enzymes are elevated. She recently stopped drinking when she saw her lab results.  Past Medical History:  Diagnosis Date  . Brain aneurysm 05/09/12   coil/stent, angio with stent 02/2014  . Cervical cancer ([place])   . Diverticulosis   . Environmental allergies   . History of shingles   . HTN (hypertension)   . Menopause   . Migraine   . Osteoarthritis    left knee  . Personal history of colonic polyps    noted on 2012 colonoscopy  . Sleep apnea 06/2011 dx   CPAP 13/[facility]  . Stroke (cerebrum) ([place])   . Vertigo     Past Surgical History:  Procedure Laterality Date  . ANEURYSM COILING  05/09/2012   middle  . Brain stent  2013, 2015  . BREAST BIOPSY Right 1988   BENIGN  . BREAST BIOPSY Left 1993   BENIGN  . KNEE ARTHROSCOPY Left   . RADIOLOGY WITH ANESTHESIA N/A 09/03/2012   Procedure: RADIOLOGY WITH ANESTHESIA;  Surgeon: Rob Hickman, MD;  Location: [facility];  Service: Radiology;  Laterality: N/A;  aneurysm embolization   . RADIOLOGY WITH ANESTHESIA N/A 02/10/2014   Procedure: RADIOLOGY WITH ANESTHESIA, emobolization;  Surgeon: Rob Hickman, MD;  Location: [facility];  Service: Radiology;  Laterality: N/A;    Family History  Problem Relation Age of Onset  . Emphysema Father        smoker  . Lung cancer Father 65  . Prostate cancer Father   . Kidney cancer Father   . Scleroderma Mother 37  . Heart attack Maternal Grandmother     Social History   Socioeconomic History  . Marital status: Significant Other    Spouse name: Not on file  . Number of children: 1  . Years of education: Not on file  . Highest education level: Not on file  Occupational History  . Occupation: PERSONNEL    Employer: [employer]  Tobacco Use  . Smoking status: Former Smoker    Packs/day: 0.25    Years: 10.00    Pack years: 2.50    Types: Cigarettes    Quit date: 07/10/2011    Years since quitting: 8.4  . Smokeless tobacco: Never Used  Substance and Sexual Activity  . Alcohol use: Yes    Alcohol/week: 0.0 standard drinks    Comment: 1-2 drinks 3-5 times a week  . Drug use: No  . Sexual activity: Not Currently  Other Topics Concern  . 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 Difficulty  of Paying Living Expenses:   Food Insecurity:   . Worried About Charity fundraiser in the Last Year:   . Arboriculturist in the Last Year:   Transportation Needs:   . Film/video editor (Medical):   Marland Kitchen Lack of Transportation (Non-Medical):   Physical Activity:   . Days of Exercise per Week:   . Minutes of Exercise per Session:   Stress:   . Feeling of Stress :   Social Connections:   . Frequency of Communication with Friends and Family:   . Frequency of Social Gatherings with Friends and Family:   . Attends Religious Services:   . Active Member of Clubs or Organizations:   . Attends Archivist Meetings:   Marland Kitchen Marital Status:   Intimate Partner Violence:   . Fear of Current or Ex-Partner:   . Emotionally Abused:   Marland Kitchen  Physically Abused:   . Sexually Abused:     Outpatient Medications Prior to Visit  Medication Sig Dispense Refill  . aspirin 325 MG tablet Take 325 mg by mouth daily.    . cetirizine (ZYRTEC) 10 MG tablet Take 10 mg by mouth daily.    . hydrochlorothiazide (HYDRODIURIL) 12.5 MG tablet Take 1 tablet (12.5 mg total) by mouth daily. 90 tablet 1  . Omega-3 Fatty Acids (FISH OIL PO) Take 1 capsule by mouth daily.    Marland Kitchen omeprazole (PRILOSEC) 40 MG capsule TAKE 1 CAPSULE(40 MG) BY MOUTH DAILY 90 capsule 1  . VITAMIN D, CHOLECALCIFEROL, PO Take 25 mcg by mouth daily.    Marland Kitchen zolpidem (AMBIEN) 5 MG tablet Take 1 tablet (5 mg total) by mouth at bedtime as needed for sleep. 30 tablet 5  . atorvastatin (LIPITOR) 10 MG tablet Take 1 tablet (10 mg total) by mouth at bedtime. (Patient not taking: Reported on 08/13/2019) 90 tablet 1   No facility-administered medications prior to visit.    No Known Allergies  ROS Review of Systems  A fourteen system review of systems was performed and found to be positive as per HPI.    Objective:    Physical Exam General: Well nourished, in no apparent distress. Eyes: PERRLA, EOMs, conjunctiva clr Resp: Respiratory effort- normal, ECTA B/L w/o W/R/R  Cardio: RRR w/o MRGs. Abdomen: no gross distention. Lymphatics:  less 2 sec cap RF M-sk: Full ROM, 5/5 strength, normal gait.  Skin: Warm, dry  Neuro: Alert, Oriented Psych: Normal affect, Insight and Judgment appropriate.    BP 110/70   Pulse 80   Temp 98.3 F (36.8 C) (Oral)   Ht 5\' 7"  (1.702 m)   Wt 181 lb 4.8 oz (82.2 kg)   SpO2 96%   BMI 28.40 kg/m  Wt Readings from Last 3 Encounters:  12/11/19 181 lb 4.8 oz (82.2 kg)  08/13/19 177 lb 9.6 oz (80.6 kg)  04/03/19 186 lb (84.4 kg)     Health Maintenance Due  Topic Date Due  . HIV Screening  Never done  . TETANUS/TDAP  Never done    There are no preventive care reminders to display for this patient.  Lab Results  Component Value Date   TSH  2.320 03/31/2019   Lab Results  Component Value Date   WBC 4.9 03/31/2019   HGB 13.5 03/31/2019   HCT 38.4 03/31/2019   MCV 97 03/31/2019   PLT 231 03/31/2019   Lab Results  Component Value Date   NA 142 07/31/2019   K 4.0 07/31/2019   CO2  25 07/31/2019   GLUCOSE 83 07/31/2019   BUN 9 07/31/2019   CREATININE 0.89 07/31/2019   BILITOT 0.5 07/31/2019   ALKPHOS 76 07/31/2019   AST 41 (H) 12/08/2019   ALT 50 (H) 12/08/2019   PROT 7.3 07/31/2019   ALBUMIN 4.6 07/31/2019   CALCIUM 10.0 07/31/2019   ANIONGAP 10 02/11/2014   GFR 70.01 09/23/2015   Lab Results  Component Value Date   CHOL 211 (H) 12/08/2019   Lab Results  Component Value Date   HDL 64 12/08/2019   Lab Results  Component Value Date   LDLCALC 134 (H) 12/08/2019   Lab Results  Component Value Date   TRIG 71 12/08/2019   Lab Results  Component Value Date   CHOLHDL 3.3 12/08/2019   Lab Results  Component Value Date   HGBA1C 5.8 (H) 03/31/2019      Assessment & Plan:   Problem List Items Addressed This Visit      Cardiovascular and Mediastinum   HTN, goal below 130/80 - Primary   Relevant Medications   rosuvastatin (CRESTOR) 5 MG tablet     Digestive   Gastroesophageal reflux disease     Other   Elevated liver enzymes   Hyperlipidemia   Relevant Medications   rosuvastatin (CRESTOR) 5 MG tablet     HTN: - BP today is 110/70, at goal. - Continue HCTZ 12.5 mg - Encourage ambulatory BP and pulse monitoring. - Continue DASH diet. - Stay well hydrated, at least 64 fl oz.  HLD: - Most recent lipid panel: total cholesterol and LDL elevated. - Pt agreeable to starting 0.5 tablet of Crestor 5 mg. - Follow heart healthy diet and increase physical activity level. - Follow-up in 6 weeks for lab visit only to recheck lipid panel and hepatic function.  Elevated liver enzymes: - Advised patient to continue alcohol cessation, especially with statin therapy. Pt verbalized understanding. - Will  continue to monitor and recheck in 6 weeks.  GERD: - Continue Prilosec  Meds ordered this encounter  Medications  . rosuvastatin (CRESTOR) 5 MG tablet    Sig: Take 0.5 tablet by mouth once daily.    Dispense:  90 tablet    Refill:  0    Order Specific Question:   Supervising Provider    Answer:   Beatrice Lecher D [[id number]    Follow-up: Return in about 3 months (around 03/12/2020) for HTN, HLD; lab only in 6 weeks (lipid, hepatic function).    Lorrene Reid, PA-C

## 2019-12-11 ENCOUNTER — Ambulatory Visit: Payer: Commercial Managed Care - PPO | Admitting: Physician Assistant

## 2019-12-11 ENCOUNTER — Other Ambulatory Visit: Payer: Self-pay

## 2019-12-11 ENCOUNTER — Encounter: Payer: Self-pay | Admitting: Physician Assistant

## 2019-12-11 VITALS — BP 110/70 | HR 80 | Temp 98.3°F | Ht 67.0 in | Wt 181.3 lb

## 2019-12-11 DIAGNOSIS — R748 Abnormal levels of other serum enzymes: Secondary | ICD-10-CM | POA: Diagnosis not present

## 2019-12-11 DIAGNOSIS — E785 Hyperlipidemia, unspecified: Secondary | ICD-10-CM | POA: Diagnosis not present

## 2019-12-11 DIAGNOSIS — K219 Gastro-esophageal reflux disease without esophagitis: Secondary | ICD-10-CM

## 2019-12-11 DIAGNOSIS — I10 Essential (primary) hypertension: Secondary | ICD-10-CM

## 2019-12-11 MED ORDER — ROSUVASTATIN CALCIUM 5 MG PO TABS: ORAL_TABLET | ORAL | 0 refills | Status: DC

## 2019-12-11 NOTE — Patient Instructions (Signed)

## 2020-01-25 ENCOUNTER — Other Ambulatory Visit: Payer: Commercial Managed Care - PPO

## 2020-01-25 ENCOUNTER — Other Ambulatory Visit: Payer: Self-pay

## 2020-01-25 DIAGNOSIS — R748 Abnormal levels of other serum enzymes: Secondary | ICD-10-CM

## 2020-01-26 ENCOUNTER — Other Ambulatory Visit: Payer: Self-pay | Admitting: Family Medicine

## 2020-01-26 LAB — HEPATIC FUNCTION PANEL
ALT: 39 IU/L — ABNORMAL HIGH (ref 0–32)
AST: 35 IU/L (ref 0–40)
Albumin: 4.1 g/dL (ref 3.8–4.8)
Alkaline Phosphatase: 106 IU/L (ref 48–121)
Bilirubin Total: 0.3 mg/dL (ref 0.0–1.2)
Bilirubin, Direct: 0.11 mg/dL (ref 0.00–0.40)
Total Protein: 7.1 g/dL (ref 6.0–8.5)

## 2020-01-27 ENCOUNTER — Encounter: Payer: Self-pay | Admitting: Physician Assistant

## 2020-02-03 ENCOUNTER — Telehealth: Payer: Self-pay | Admitting: Physician Assistant

## 2020-02-03 MED ORDER — HYDROCHLOROTHIAZIDE 12.5 MG PO TABS: 12.5000 mg | ORAL_TABLET | Freq: Every day | ORAL | 0 refills | Status: DC

## 2020-02-03 NOTE — Addendum Note (Signed)
Addended by: Mickel Crow on: 02/03/2020 10:16 AM   Modules accepted: Orders

## 2020-02-03 NOTE — Telephone Encounter (Signed)
Refill sent to requested pharmacy. AS, CMA 

## 2020-02-03 NOTE — Telephone Encounter (Signed)
Patient called in requesting a refill on Hydrochlorothiazide. Please send to [facility]. Thanks

## 2020-02-24 ENCOUNTER — Other Ambulatory Visit: Payer: Self-pay

## 2020-02-24 ENCOUNTER — Ambulatory Visit: Payer: Commercial Managed Care - PPO | Admitting: Internal Medicine

## 2020-02-24 ENCOUNTER — Encounter: Payer: Self-pay | Admitting: Internal Medicine

## 2020-02-24 DIAGNOSIS — G4733 Obstructive sleep apnea (adult) (pediatric): Secondary | ICD-10-CM

## 2020-02-24 DIAGNOSIS — J310 Chronic rhinitis: Secondary | ICD-10-CM

## 2020-02-24 NOTE — Patient Instructions (Addendum)
We can continue CPAP 12   If the watery nose returns consider trying otc Nasalcrom/ cromolyn/ cromol nasal spray

## 2020-02-24 NOTE — Progress Notes (Signed)
HPI female former smoker followed for OSA, allergic rhinitis, complicated by brain aneurysms/[facility] NPSG 07/06/11- Mild OSA AHI 7.8/ hr, nonpositional, loud snoring, desat to 83%.   ----------------------------------------------------------------------------------. Marland Kitchen  02/24/2019- 64 year old female former smoker followed for OSA, allergic rhinitis, complicated by brain aneurysms/[facility] CPAP 12/[facility]    OSA on CPAP  DME: [facility], no complaints, using every night "Funny noise" Body weight today 177 lbs Covid careful. Denies significant health changes.  02/24/20- 64 year old female former smoker followed for OSA, allergic rhinitis, complicated by brain aneurysms/[facility] CPAP 12/[facility] Download compliance 93%, AHI 1.1/ hr Body weight today 180 lbs  Doing well with CPAP- download reviewed.  Notes clear rhinorhea resolved while in dry [place] and hasn't recurred on return.  ROS-see HPI + = positive Constitutional:   No-   weight loss, night sweats, fevers, chills, fatigue, lassitude. HEENT:   No-  headaches, difficulty swallowing, tooth/dental problems, sore throat,       Some  sneezing, itching, ear ache, nasal congestion, post nasal drip,  CV:  No-   chest pain, orthopnea, PND, swelling in lower extremities, anasarca, dizziness, palpitations Resp: No-   shortness of breath with exertion or at rest.              No-   productive cough,  No non-productive cough,  No- coughing up of blood,   change in color of mucus.  No- wheezing.   Skin: No-   rash or lesions. GI:  No-   heartburn, indigestion, abdominal pain, nausea, vomiting,  GU: MS: Normal apparent strength and muscle bulk Neuro-     nothing unusual Psych:  No- change in mood or affect. No depression or anxiety.  No memory loss.  OBJ           General- Alert, Oriented, Affect-appropriate/ friendly, Distress- none acute, tall Skin- rash-none, lesions- none, excoriation- none Lymphadenopathy- none Head- atraumatic            Eyes- Gross vision  intact, PERRLA, conjunctivae clear secretions            Ears- Hearing, canals-normal            Nose- Clear, no-Septal dev, mucus, polyps, erosion, perforation             Throat- Mallampati II-III , mucosa clear , drainage- none, tonsils- atrophic Neck- flexible , trachea midline, no stridor , thyroid nl, carotid no bruit Chest - symmetrical excursion , unlabored           Heart/CV- RRR , no murmur , no gallop  , no rub, nl s1 s2                           - JVD- none , edema- none, stasis changes- none, varices- none           Lung- clear to P&A, wheeze- none, cough- none , dullness-none, rub- none           Chest wall-  Abd- Br/ Gen/ Rectal- Not done, not indicated Extrem- cyanosis- none, clubbing, none, atrophy- none, strength- nl Neuro- grossly intact to observation

## 2020-03-05 DIAGNOSIS — J310 Chronic rhinitis: Secondary | ICD-10-CM | POA: Insufficient documentation

## 2020-03-05 NOTE — Assessment & Plan Note (Signed)
Benefits from CPAP and good compliance/ control Plan- continue CPAP 12

## 2020-03-05 NOTE — Assessment & Plan Note (Signed)
Not sure why this has not flared since return from [place]. Discussed trial of nasalcrom as an option if it does come back.

## 2020-03-10 ENCOUNTER — Other Ambulatory Visit: Payer: Self-pay | Admitting: Family Medicine

## 2020-03-17 ENCOUNTER — Telehealth: Payer: Self-pay | Admitting: Physician Assistant

## 2020-03-17 MED ORDER — OMEPRAZOLE 40 MG PO CPDR: DELAYED_RELEASE_CAPSULE | ORAL | 0 refills | Status: DC

## 2020-03-17 NOTE — Addendum Note (Signed)
Addended by: Mickel Crow on: 03/17/2020 02:37 PM   Modules accepted: Orders

## 2020-03-17 NOTE — Telephone Encounter (Signed)
Patient need to schedule apt for further refills.   #60 day supply sent to pharmacy. AS, CMA

## 2020-03-17 NOTE — Telephone Encounter (Signed)
Patient needs a refill on omeprazole. [facility]

## 2020-03-18 ENCOUNTER — Ambulatory Visit: Payer: Commercial Managed Care - PPO | Admitting: Physician Assistant

## 2020-04-11 ENCOUNTER — Other Ambulatory Visit: Payer: Self-pay | Admitting: Physician Assistant

## 2020-05-03 ENCOUNTER — Other Ambulatory Visit: Payer: Self-pay | Admitting: Physician Assistant

## 2020-05-18 ENCOUNTER — Other Ambulatory Visit: Payer: Self-pay | Admitting: Physician Assistant

## 2020-05-18 DIAGNOSIS — E785 Hyperlipidemia, unspecified: Secondary | ICD-10-CM

## 2020-06-06 ENCOUNTER — Other Ambulatory Visit: Payer: Self-pay | Admitting: Physician Assistant

## 2020-06-06 DIAGNOSIS — Z1231 Encounter for screening mammogram for malignant neoplasm of breast: Secondary | ICD-10-CM

## 2020-06-10 DIAGNOSIS — Z1231 Encounter for screening mammogram for malignant neoplasm of breast: Secondary | ICD-10-CM

## 2020-06-18 ENCOUNTER — Other Ambulatory Visit: Payer: Self-pay | Admitting: Physician Assistant

## 2020-06-18 DIAGNOSIS — E785 Hyperlipidemia, unspecified: Secondary | ICD-10-CM

## 2020-07-04 ENCOUNTER — Other Ambulatory Visit: Payer: Self-pay | Admitting: Physician Assistant

## 2020-07-05 ENCOUNTER — Telehealth: Payer: Self-pay | Admitting: Physician Assistant

## 2020-07-05 NOTE — Telephone Encounter (Signed)
Please call patient to schedule OV for further med refills per last AVS. AS, CMA

## 2020-07-13 ENCOUNTER — Ambulatory Visit: Payer: Commercial Managed Care - PPO | Admitting: Physician Assistant

## 2020-07-13 ENCOUNTER — Other Ambulatory Visit: Payer: Self-pay

## 2020-08-11 ENCOUNTER — Other Ambulatory Visit: Payer: Self-pay

## 2020-08-11 ENCOUNTER — Ambulatory Visit
Admission: RE | Admit: 2020-08-11 | Discharge: 2020-08-11 | Disposition: A | Discharge status: Home / Self Care | Payer: Commercial Managed Care - PPO | Source: Ambulatory Visit | Attending: Physician Assistant | Admitting: Physician Assistant

## 2020-08-11 DIAGNOSIS — Z1231 Encounter for screening mammogram for malignant neoplasm of breast: Secondary | ICD-10-CM

## 2020-09-16 ENCOUNTER — Encounter: Payer: Self-pay | Admitting: Gastroenterology

## 2020-11-23 ENCOUNTER — Other Ambulatory Visit: Payer: Self-pay

## 2020-11-23 ENCOUNTER — Ambulatory Visit: Payer: Commercial Managed Care - PPO | Admitting: *Deleted

## 2020-11-23 VITALS — Ht 67.0 in | Wt 180.0 lb

## 2020-11-23 DIAGNOSIS — Z1211 Encounter for screening for malignant neoplasm of colon: Secondary | ICD-10-CM

## 2020-11-23 MED ORDER — NA SULFATE-K SULFATE-MG SULF 17.5-3.13-1.6 GM/177ML PO SOLN: 1.0000 | Freq: Once | ORAL | 0 refills | Status: AC

## 2020-11-23 NOTE — Progress Notes (Addendum)
No egg or soy allergy known to patient  No issues with past sedation with any surgeries or procedures Patient denies ever being told they had issues or difficulty with intubation  No FH of Malignant Hyperthermia No diet pills per patient No home 02 use per patient  No blood thinners per patient  Pt denies issues with constipation  No A fib or A flutter  EMMI video to pt or via [place] 19 guidelines implemented in PV today with Pt and RN  Pt is fully vaccinated  for Covid    Patient aware that Suprep not preferred by her insurance, she wishes to continue with Suprep  knowing she may have to pay full price.   Due to the COVID-19 pandemic we are asking patients to follow certain guidelines.  Pt aware of COVID protocols and [facility] guidelines

## 2020-12-07 ENCOUNTER — Encounter: Payer: Self-pay | Admitting: Gastroenterology

## 2020-12-13 ENCOUNTER — Encounter: Payer: Self-pay | Admitting: Gastroenterology

## 2020-12-13 ENCOUNTER — Ambulatory Visit (AMBULATORY_SURGERY_CENTER): Payer: Commercial Managed Care - PPO | Admitting: Gastroenterology

## 2020-12-13 ENCOUNTER — Other Ambulatory Visit: Payer: Self-pay

## 2020-12-13 VITALS — BP 127/74 | HR 63 | Temp 97.0°F | Resp 10

## 2020-12-13 DIAGNOSIS — D128 Benign neoplasm of rectum: Secondary | ICD-10-CM

## 2020-12-13 DIAGNOSIS — Z1211 Encounter for screening for malignant neoplasm of colon: Secondary | ICD-10-CM

## 2020-12-13 DIAGNOSIS — D125 Benign neoplasm of sigmoid colon: Secondary | ICD-10-CM

## 2020-12-13 DIAGNOSIS — D127 Benign neoplasm of rectosigmoid junction: Secondary | ICD-10-CM

## 2020-12-13 HISTORY — PX: COLONOSCOPY: SHX174

## 2020-12-13 MED ORDER — SODIUM CHLORIDE 0.9 % IV SOLN: 500.0000 mL | Freq: Once | INTRAVENOUS | Status: DC

## 2020-12-13 NOTE — Op Note (Signed)
[facility] Patient Name: Shannon Yu Procedure Date: 12/13/2020 10:42 AM MRN: [id number] Endoscopist: Remo Lipps P. Havery Moros , MD Age: 65 Referring MD:  Date of Birth: 1955/10/28 Gender: Female Account #: [id number] Procedure:                Colonoscopy Indications:              Screening for colorectal malignant neoplasm Medicines:                Monitored Anesthesia Care Procedure:                Pre-Anesthesia Assessment:                           - Prior to the procedure, a History and Physical                            was performed, and patient medications and                            allergies were reviewed. The patient's tolerance of                            previous anesthesia was also reviewed. The risks                            and benefits of the procedure and the sedation                            options and risks were discussed with the patient.                            All questions were answered, and informed consent                            was obtained. Prior Anticoagulants: The patient has                            taken no previous anticoagulant or antiplatelet                            agents. ASA Grade Assessment: II - A patient with                            mild systemic disease. After reviewing the risks                            and benefits, the patient was deemed in                            satisfactory condition to undergo the procedure.                           After obtaining informed consent, the colonoscope  was passed under direct vision. Throughout the                            procedure, the patient's blood pressure, pulse, and                            oxygen saturations were monitored continuously. The                            Olympus PFC-H190DL [id number]) Colonoscope was                            introduced through the anus and advanced to the the                            cecum,  identified by appendiceal orifice and                            ileocecal valve. The colonoscopy was performed                            without difficulty. The patient tolerated the                            procedure well. The quality of the bowel                            preparation was adequate. The ileocecal valve,                            appendiceal orifice, and rectum were photographed. Scope In: 10:54:24 AM Scope Out: 11:15:38 AM Scope Withdrawal Time: 0 hours 17 minutes 1 second  Total Procedure Duration: 0 hours 21 minutes 14 seconds  Findings:                 The perianal and digital rectal examinations were                            normal.                           Two sessile polyps were found in the sigmoid colon.                            The polyps were 2 to 5 mm in size. These polyps                            were removed with a cold snare. Resection and                            retrieval were complete.                           Multiple small-mouthed diverticula were found in  the sigmoid colon.                           A 6 mm polyp was found in the recto-sigmoid colon.                            The polyp was sessile. The polyp was removed with a                            cold snare. Resection and retrieval were complete.                           A diminutive polyp was found in the rectum. The                            polyp was sessile. The polyp was removed with a                            cold snare. Resection and retrieval were complete.                           Internal hemorrhoids were found during retroflexion.                           The exam was otherwise without abnormality. Complications:            No immediate complications. Estimated blood loss:                            Minimal. Estimated Blood Loss:     Estimated blood loss was minimal. Impression:               - Two 2 to 5 mm polyps in the sigmoid  colon,                            removed with a cold snare. Resected and retrieved.                           - Diverticulosis in the sigmoid colon.                           - One 6 mm polyp at the recto-sigmoid colon,                            removed with a cold snare. Resected and retrieved.                           - One diminutive polyp in the rectum, removed with                            a cold snare. Resected and retrieved.                           - Internal hemorrhoids.                           -  The examination was otherwise normal. Recommendation:           - Patient has a contact number available for                            emergencies. The signs and symptoms of potential                            delayed complications were discussed with the                            patient. Return to normal activities tomorrow.                            Written discharge instructions were provided to the                            patient.                           - Resume previous diet.                           - Continue present medications.                           - Await pathology results. Remo Lipps P. Shannon Harmsen, MD 12/13/2020 11:19:33 AM This report has been signed electronically.

## 2020-12-13 NOTE — Progress Notes (Signed)
Pt's states no medical or surgical changes since previsit or office visit.  Vs JK

## 2020-12-13 NOTE — Patient Instructions (Signed)
Read all of the handouts given to you by your recovery room nurse.    YOU HAD AN ENDOSCOPIC PROCEDURE TODAY AT [facility]:   Refer to the procedure report that was given to you for any specific questions about what was found during the examination.  If the procedure report does not answer your questions, please call your gastroenterologist to clarify.  If you requested that your care partner not be given the details of your procedure findings, then the procedure report has been included in a sealed envelope for you to review at your convenience later.  YOU SHOULD EXPECT: Some feelings of bloating in the abdomen. Passage of more gas than usual.  Walking can help get rid of the air that was put into your GI tract during the procedure and reduce the bloating. If you had a lower endoscopy (such as a colonoscopy or flexible sigmoidoscopy) you may notice spotting of blood in your stool or on the toilet paper. If you underwent a bowel prep for your procedure, you may not have a normal bowel movement for a few days.  Please Note:  You might notice some irritation and congestion in your nose or some drainage.  This is from the oxygen used during your procedure.  There is no need for concern and it should clear up in a day or so.  SYMPTOMS TO REPORT IMMEDIATELY:   Following lower endoscopy (colonoscopy or flexible sigmoidoscopy):  Excessive amounts of blood in the stool  Significant tenderness or worsening of abdominal pains  Swelling of the abdomen that is new, acute  Fever of 100F or higher   For urgent or emergent issues, a gastroenterologist can be reached at any hour by calling [phone]. Do not use MyChart messaging for urgent concerns.    DIET:  We do recommend a small meal at first, but then you may proceed to your regular diet.  Drink plenty of fluids but you should avoid alcoholic beverages for 24 hours.  Try to eat a high fiber diet, and drink plenty of  water.  ACTIVITY:  You should plan to take it easy for the rest of today and you should NOT DRIVE or use heavy machinery until tomorrow (because of the sedation medicines used during the test).    FOLLOW UP: Our staff will call the number listed on your records 48-72 hours following your procedure to check on you and address any questions or concerns that you may have regarding the information given to you following your procedure. If we do not reach you, we will leave a message.  We will attempt to reach you two times.  During this call, we will ask if you have developed any symptoms of COVID 19. If you develop any symptoms (ie: fever, flu-like symptoms, shortness of breath, cough etc.) before then, please call [phone].  If you test positive for Covid 19 in the 2 weeks post procedure, please call and report this information to [place].    If any biopsies were taken you will be contacted by phone or by letter within the next 1-3 weeks.  Please call us at [phone] if you have not heard about the biopsies in 3 weeks.    SIGNATURES/CONFIDENTIALITY: You and/or your care partner have signed paperwork which will be entered into your electronic medical record.  These signatures attest to the fact that that the information above on your After Visit Summary has been reviewed and is understood.  Full responsibility of the  confidentiality of this discharge information lies with you and/or your care-partner. 

## 2020-12-13 NOTE — Progress Notes (Signed)
To pacu, VSS. Report to [email or web address] °

## 2020-12-13 NOTE — Progress Notes (Signed)
Called to room to assist during endoscopic procedure.  Patient ID and intended procedure confirmed with present staff. Received instructions for my participation in the procedure from the performing physician.  

## 2020-12-15 ENCOUNTER — Telehealth: Payer: Self-pay | Admitting: *Deleted

## 2020-12-15 ENCOUNTER — Telehealth: Payer: Self-pay

## 2020-12-15 NOTE — Telephone Encounter (Signed)
First follow up call attempt.  LVM. 

## 2020-12-15 NOTE — Telephone Encounter (Signed)
Attempted to reach pt. With follow-up call following endoscopic procedure 12/13/2020.  LM on pt. Voice mail to call if she has any questions or concerns.

## 2021-02-21 NOTE — Progress Notes (Signed)
HPI female former smoker followed for OSA, allergic rhinitis, complicated by brain aneurysms/SAH NPSG 07/06/11- Mild OSA AHI 7.8/ hr, nonpositional, loud snoring, desat to 83%.   ----------------------------------------------------------------------------------. Marland Kitchen  02/24/20- 66 year old female former smoker followed for OSA, allergic rhinitis, complicated by brain aneurysms/SAH CPAP 12/[facility] Download compliance 93%, AHI 1.1/ hr Body weight today 180 lbs  Doing well with CPAP- download reviewed.  Notes clear rhinorhea resolved while in dry [place] and hasn't recurred on return.  02/23/21- 65 year old female former smoker (2.5 pk yrs)followed for OSA, allergic rhinitis, complicated by brain aneurysms/SAH, Covid infection March 2022,  CPAP auto 10-15/[facility] Download-compliance 97%, AHI 1.8/ hr        AirSense 10 AutoSet Body weight today-185 lbs Covid vax-3 Ph[employer] -----No complaint's cpap working great. Doing well with CPAP. Reviewed download. Discussed communication with DME company. Covid infection March after trip to [place]- resolved. Discussed vaccine. Continues surveillance for stented brain aneurysm.  ROS-see HPI + = positive Constitutional:   No-   weight loss, night sweats, fevers, chills, fatigue, lassitude. HEENT:   No-  headaches, difficulty swallowing, tooth/dental problems, sore throat,       Some  sneezing, itching, ear ache, nasal congestion, post nasal drip,  CV:  No-   chest pain, orthopnea, PND, swelling in lower extremities, anasarca, dizziness, palpitations Resp: No-   shortness of breath with exertion or at rest.              No-   productive cough,  No non-productive cough,  No- coughing up of blood,   change in color of mucus.  No- wheezing.   Skin: No-   rash or lesions. GI:  No-   heartburn, indigestion, abdominal pain, nausea, vomiting,  GU: MS: Normal apparent strength and muscle bulk Neuro-     nothing unusual Psych:  No- change in mood or affect. No  depression or anxiety.  No memory loss.  OBJ           General- Alert, Oriented, Affect-appropriate/ friendly, Distress- none acute, tall Skin- rash-none, lesions- none, excoriation- none Lymphadenopathy- none Head- atraumatic            Eyes- Gross vision intact, PERRLA, conjunctivae clear secretions            Ears- Hearing, canals-normal            Nose- Clear, no-Septal dev, mucus, polyps, erosion, perforation             Throat- Mallampati II-III , mucosa clear , drainage- none, tonsils- atrophic Neck- flexible , trachea midline, no stridor , thyroid nl, carotid no bruit Chest - symmetrical excursion , unlabored           Heart/CV- RRR , no murmur , no gallop  , no rub, nl s1 s2                           - JVD- none , edema- none, stasis changes- none, varices- none           Lung- clear to P&A, wheeze- none, cough- none , dullness-none, rub- none           Chest wall-  Abd- Br/ Gen/ Rectal- Not done, not indicated Extrem- cyanosis- none, clubbing, none, atrophy- none, strength- nl Neuro- grossly intact to observation

## 2021-02-23 ENCOUNTER — Other Ambulatory Visit: Payer: Self-pay

## 2021-02-23 ENCOUNTER — Encounter: Payer: Self-pay | Admitting: Internal Medicine

## 2021-02-23 ENCOUNTER — Ambulatory Visit: Payer: Commercial Managed Care - PPO | Admitting: Internal Medicine

## 2021-02-23 DIAGNOSIS — G4733 Obstructive sleep apnea (adult) (pediatric): Secondary | ICD-10-CM

## 2021-02-23 NOTE — Patient Instructions (Addendum)
We can continue CPAP auto 10-15  I'm glad you are doing well. Please call if we can help

## 2021-02-23 NOTE — Assessment & Plan Note (Signed)
Benefits from CPAP with good compliance and control. No concerns Plan- continue Auto 10-15

## 2021-05-05 ENCOUNTER — Other Ambulatory Visit: Payer: Self-pay | Admitting: Orthopedic Surgery

## 2021-05-05 DIAGNOSIS — M7712 Lateral epicondylitis, left elbow: Secondary | ICD-10-CM

## 2021-06-21 ENCOUNTER — Ambulatory Visit
Admission: RE | Admit: 2021-06-21 | Discharge: 2021-06-21 | Disposition: A | Discharge status: Home / Self Care | Payer: Commercial Managed Care - PPO | Source: Ambulatory Visit | Attending: Orthopedic Surgery | Admitting: Orthopedic Surgery

## 2021-06-21 ENCOUNTER — Other Ambulatory Visit: Payer: Self-pay

## 2021-06-21 DIAGNOSIS — M7712 Lateral epicondylitis, left elbow: Secondary | ICD-10-CM

## 2021-06-21 MED ORDER — GADOBENATE DIMEGLUMINE 529 MG/ML IV SOLN
15.0000 mL | Freq: Once | INTRAVENOUS | Status: AC | PRN
  Administered 2021-06-21: 15:00:00 15 mL via INTRAVENOUS

## 2021-07-24 ENCOUNTER — Other Ambulatory Visit (HOSPITAL_COMMUNITY): Payer: Self-pay | Admitting: Interventional Radiology

## 2021-07-24 DIAGNOSIS — I671 Cerebral aneurysm, nonruptured: Secondary | ICD-10-CM

## 2021-08-20 NOTE — Progress Notes (Signed)
Cardiology Office Note:    Date:  08/21/2021   ID:  Shannon Yu, Shannon Yu 02-17-1956, MRN [id number]  PCP:  Lilian Coma., MD  Cardiologist:  Donato Heinz, MD  Electrophysiologist:  None   Referring MD: Lilian Coma., MD   Chief Complaint  Patient presents with   Atrial Fibrillation    History of Present Illness:    Shannon Yu is a 66 y.o. female with a hx of brain aneurysm, SAH, cervical cancer, OSA who is referred by Dr. Valora Piccolo for evaluation of atrial fibrillation.  She recently underwent removal of a mass on her arm and during the procedure was found to be in atrial fibrillation.  She does have a history of a subarachnoid hemorrhage.  She was admitted in November 2013 with subarachnoid hemorrhage.  CT a showed aneurysm and she was treated with coil embolization.  She was found on angiogram in 2014 to have remnant of aneurysm and underwent additional coiling and stent placement 08/2012.  Required further stenting 02/2014.  She reports that she had schwannoma removal and was told she had A-fib during the procedure.  She is not sure how long the A-fib lasted.  She reports rare palpitations where feels like heart is racing, occurs few times per year and lasts minutes.  Denies any lightheadedness, syncope, or chest pain.  Does report some dyspnea.  No lower extremity edema.  Reports BP usually well controlled but has not been checking recently.  Smoked for 15 years about 1 pack/day, quit in 2013.  Family history includes mother had Wolff-Parkinson-White syndrome   Past Medical History:  Diagnosis Date   Brain aneurysm 05/09/12   coil/stent, angio with stent 02/2014   Cervical cancer ([place])    Diverticulosis    Environmental allergies    History of shingles    HTN (hypertension)    Menopause    Migraine    Osteoarthritis    left knee   Personal history of colonic polyps    noted on 2012 colonoscopy   Sleep apnea 06/2011 dx   CPAP 13/[facility]   Stroke (cerebrum) ([place])     Vertigo     Past Surgical History:  Procedure Laterality Date   ANEURYSM COILING  05/09/2012   middle   Brain stent  2013, 2015   BREAST BIOPSY Right 1988   BENIGN   BREAST BIOPSY Left 1993   BENIGN   COLONOSCOPY  12/13/2020   KNEE ARTHROSCOPY Left    RADIOLOGY WITH ANESTHESIA N/A 09/03/2012   Procedure: RADIOLOGY WITH ANESTHESIA;  Surgeon: Rob Hickman, MD;  Location: [facility];  Service: Radiology;  Laterality: N/A;  aneurysm embolization    RADIOLOGY WITH ANESTHESIA N/A 02/10/2014   Procedure: RADIOLOGY WITH ANESTHESIA, emobolization;  Surgeon: Rob Hickman, MD;  Location: [facility];  Service: Radiology;  Laterality: N/A;    Current Medications: Current Meds  Medication Sig   aspirin 325 MG tablet Take 325 mg by mouth daily.   cetirizine (ZYRTEC) 10 MG tablet Take 10 mg by mouth daily.   hydrochlorothiazide (HYDRODIURIL) 12.5 MG tablet TAKE 1 TABLET DAILY. OFFICE VISIT REQUIRED PRIOR TO ANY FURTHER REFILLS   Omega-3 Fatty Acids (FISH OIL PO) Take 1 capsule by mouth daily.   omeprazole (PRILOSEC) 40 MG capsule TAKE 1 CAPSULE(40 MG) BY MOUTH DAILY**NEEDS APT FOR FURTHER REFILLS**   VITAMIN D, CHOLECALCIFEROL, PO Take 25 mcg by mouth daily.   zolpidem (AMBIEN) 5 MG tablet Take 1 tablet (5 mg total) by mouth  at bedtime as needed for sleep.     Allergies:   Patient has no known allergies.   Social History   Socioeconomic History   Marital status: Significant Other    Spouse name: Not on file   Number of children: 1   Years of education: Not on file   Highest education level: Not on file  Occupational History   Occupation: PERSONNEL    Employer: [employer]  Tobacco Use   Smoking status: Former    Packs/day: 0.25    Years: 10.00    Pack years: 2.50    Types: Cigarettes    Start date: 19    Quit date: 07/10/2011    Years since quitting: 10.1   Smokeless tobacco: Never  Vaping Use   Vaping Use: Never used  Substance and Sexual Activity   Alcohol use: Yes     Alcohol/week: 0.0 standard drinks    Comment: 1-2 drinks 3-5 times a week   Drug use: No   Sexual activity: Not Currently  Other Topics Concern   Not on file  Social History Narrative   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 There is no history of Colon polyps, Colon cancer, Esophageal cancer, Stomach cancer, or Rectal cancer.  ROS:   Please see the history of present illness.     All other systems reviewed and are negative.  EKGs/Labs/Other Studies Reviewed:    The following studies were reviewed today:   EKG:   07/20/21: Atrial fibrillation, rate 137, diffuse ST depressions with aVR elevation 08/21/2021: Normal sinus rhythm, rate 60, nonspecific T wave flattening   Recent Labs: No results found for requested labs within last 8760 hours.  Recent Lipid Panel    Component Value Date/Time   CHOL 211 (H) 12/08/2019 0842   TRIG 71 12/08/2019 0842   HDL 64 12/08/2019 0842   CHOLHDL 3.3 12/08/2019 0842   CHOLHDL 4 06/09/2014 0818   VLDL 11.0 06/09/2014 0818   LDLCALC 134 (H) 12/08/2019 0842    Physical Exam:    VS:  BP (!) 145/83    Pulse 61    Ht 5\' 7"  (1.702 m)    Wt 191 lb 12.8 oz (87 kg)    SpO2 99%    BMI 30.04 kg/m     Wt Readings from Last 3 Encounters:  08/21/21 191 lb 12.8 oz (87 kg)  02/23/21 185 lb (83.9 kg)  11/23/20 180 lb (81.6 kg)     GEN:  Well nourished, well developed in no acute distress HEENT: Normal NECK: No JVD; No carotid  bruits LYMPHATICS: No lymphadenopathy CARDIAC: RRR, no murmurs, rubs, gallops RESPIRATORY:  Clear to auscultation without rales, wheezing or rhonchi  ABDOMEN: Soft, non-tender, non-distended MUSCULOSKELETAL:  No edema; No deformity  SKIN: Warm and dry NEUROLOGIC:  Alert and oriented x 3 PSYCHIATRIC:  Normal affect   ASSESSMENT:    1. Paroxysmal atrial fibrillation (HCC)   2. SAH (subarachnoid hemorrhage) (  [place])   3. Vasospasm of cerebral artery   4. Brain aneurysm   5. Essential hypertension    PLAN:    Atrial fibrillation: Occurred while under general anesthesia for removal of skin mass.  Rates up to 130s. -Echocardiogram -Zio patch x2 weeks to evaluate A-fib burden -Will refer to neurology as well as discuss with Dr. Estanislado Pandy about anticoagulation given her history of SAH/brain aneurysm  Hypertension: On HCTZ 12.5 mg daily BP mildly elevated in clinic today, will asked to check BP twice daily for next week in cost with results  OSA: on CPAP, reports compliance  Tranasaminitis: Noted on recent labs, RUQ ultrasound planned  RTC in 3 months   Medication Adjustments/Labs and Tests Ordered: Current medicines are reviewed at length with the patient today.  Concerns regarding medicines are outlined above.  Orders Placed This Encounter  Procedures   Ambulatory referral to Neurology   LONG TERM MONITOR (3-14 DAYS)   EKG 12-Lead   ECHOCARDIOGRAM COMPLETE   No orders of the defined types were placed in this encounter.   Patient Instructions  Medication Instructions:  No changes   *If you need a refill on your cardiac medications before your next appointment, please call your pharmacy*  Other Instructions   Monitor your blood pressure for one week - check it  twice a day . Contact the office with the reading   Lab Work:  Not needed   Testing/Procedures: Will be schedule at [place] has requested that you have an  echocardiogram. Echocardiography is a painless test that uses sound waves to create images of your heart. It provides your doctor with information about the size and shape of your heart and how well your hearts chambers and valves are working. This procedure takes approximately one hour. There are no restrictions for this procedure.   And  Will be mailed to you 3 to 5 days  Your physician has recommended that you wear a holter monitor 14 day Zio . Holter monitors are medical devices that record the hearts electrical activity. Doctors most often use these monitors to diagnose arrhythmias. Arrhythmias are problems with the speed or rhythm of the heartbeat. The monitor is a small, portable device. You can wear one while you do your normal daily activities. This is usually used to diagnose what is causing palpitations/syncope (passing out).    Follow-Up: At [facility], you and your health needs are our priority.  As part of our continuing mission to provide you with exceptional heart care, we have created designated Provider Care Teams.  These Care Teams include your primary Cardiologist (physician) and Advanced Practice Providers (APPs -  Physician Assistants and Nurse Practitioners) who all work together to provide you with the care you need, when you need it.     Your next appointment:   3 month(s)  The format for your next appointment:   In Person  Provider:   Donato Heinz, MD    You have been referred to  Neurology - [facility] Neurology ) to discuss if anticoagulant can be used if necessary    Other Instructions   Monitor your blood pressure for one week - check it  twice a day . Contact the office with the reading   [place] Instructions  Your physician has requested you wear a ZIO patch monitor for 14 days.  This is a single patch monitor. [employer] supplies one patch monitor per enrollment. Additional stickers  are not available. Please do not  apply patch if you will be having a Nuclear Stress Test,  Echocardiogram, Cardiac CT, MRI, or Chest Xray during the period you would be wearing the  monitor. The patch cannot be worn during these tests. You cannot remove and re-apply the  ZIO XT patch monitor.  Your ZIO patch monitor will be mailed 3 day USPS to your address on file. It may take 3-5 days  to receive your monitor after you have been enrolled.  Once you have received your monitor, please review the enclosed instructions. Your monitor  has already been registered assigning a specific monitor serial # to you.  Billing and Patient Assistance Program Information  We have supplied [employer] with any of your insurance information on file for billing purposes. [employer] offers a sliding scale Patient Assistance Program for patients that do not have  insurance, or whose insurance does not completely cover the cost of the ZIO monitor.  You must apply for the Patient Assistance Program to qualify for this discounted rate.  To apply, please call [employer] at [phone], select option 4, select option 2, ask to apply for  Patient Assistance Program. Theodore Demark will ask your household income, and how many people  are in your household. They will quote your out-of-pocket cost based on that information.  [employer] will also be able to set up a 19-month, interest-free payment plan if needed.  Applying the monitor   Shave hair from upper left chest.  Hold abrader disc by orange tab. Rub abrader in 40 strokes over the upper left chest as  indicated in your monitor instructions.  Clean area with 4 enclosed alcohol pads. Let dry.  Apply patch as indicated in monitor instructions. Patch will be placed under collarbone on left  side of chest with arrow pointing upward.  Rub patch adhesive wings for 2 minutes. Remove white label marked "1". Remove the white  label marked "2". Rub patch adhesive wings for 2 additional minutes.  While looking in a mirror,  press and release button in center of patch. A small green light will  flash 3-4 times. This will be your only indicator that the monitor has been turned on.  Do not shower for the first 24 hours. You may shower after the first 24 hours.  Press the button if you feel a symptom. You will hear a small click. Record Date, Time and  Symptom in the Patient Logbook.  When you are ready to remove the patch, follow instructions on the last 2 pages of Patient  Logbook. Stick patch monitor onto the last page of Patient Logbook.  Place Patient Logbook in the blue and white box. Use locking tab on box and tape box closed  securely. The blue and white box has prepaid postage on it. Please place it in the mailbox as  soon as possible. Your physician should have your test results approximately 7 days after the  monitor has been mailed back to [facility].  Call [place] at [phone] if you have questions regarding  your ZIO XT patch monitor. Call them immediately if you see an orange light blinking on your  monitor.  If your monitor falls off in less than 4 days, contact our Monitor department at [phone].  If your monitor becomes loose or falls off after 4 days call [employer] at [phone] for  suggestions on securing your monitor    Signed, Donato Heinz, MD  08/21/2021 9:45 AM  [facility]

## 2021-08-21 ENCOUNTER — Ambulatory Visit: Payer: Commercial Managed Care - PPO | Admitting: Cardiology

## 2021-08-21 ENCOUNTER — Ambulatory Visit (INDEPENDENT_AMBULATORY_CARE_PROVIDER_SITE_OTHER): Payer: Commercial Managed Care - PPO

## 2021-08-21 ENCOUNTER — Encounter: Payer: Self-pay | Admitting: Cardiology

## 2021-08-21 ENCOUNTER — Other Ambulatory Visit: Payer: Self-pay | Admitting: Cardiology

## 2021-08-21 ENCOUNTER — Other Ambulatory Visit: Payer: Self-pay

## 2021-08-21 VITALS — BP 145/83 | HR 61 | Ht 67.0 in | Wt 191.8 lb

## 2021-08-21 DIAGNOSIS — I480 Paroxysmal atrial fibrillation: Secondary | ICD-10-CM

## 2021-08-21 DIAGNOSIS — I67848 Other cerebrovascular vasospasm and vasoconstriction: Secondary | ICD-10-CM

## 2021-08-21 DIAGNOSIS — I10 Essential (primary) hypertension: Secondary | ICD-10-CM | POA: Diagnosis not present

## 2021-08-21 DIAGNOSIS — I671 Cerebral aneurysm, nonruptured: Secondary | ICD-10-CM | POA: Diagnosis not present

## 2021-08-21 DIAGNOSIS — I609 Nontraumatic subarachnoid hemorrhage, unspecified: Secondary | ICD-10-CM | POA: Diagnosis not present

## 2021-08-21 DIAGNOSIS — R002 Palpitations: Secondary | ICD-10-CM

## 2021-08-21 NOTE — Patient Instructions (Addendum)
Medication Instructions:  No changes   *If you need a refill on your cardiac medications before your next appointment, please call your pharmacy*  Other Instructions   Monitor your blood pressure for one week - check it  twice a day . Contact the office with the reading   Lab Work:  Not needed   Testing/Procedures: Will be schedule at [place] has requested that you have an echocardiogram. Echocardiography is a painless test that uses sound waves to create images of your heart. It provides your doctor with information about the size and shape of your heart and how well your hearts chambers and valves are working. This procedure takes approximately one hour. There are no restrictions for this procedure.   And  Will be mailed to you 3 to 5 days  Your physician has recommended that you wear a holter monitor 14 day Zio . Holter monitors are medical devices that record the hearts electrical activity. Doctors most often use these monitors to diagnose arrhythmias. Arrhythmias are problems with the speed or rhythm of the heartbeat. The monitor is a small, portable device. You can wear one while you do your normal daily activities. This is usually used to diagnose what is causing palpitations/syncope (passing out).    Follow-Up: At [facility], you and your health needs are our priority.  As part of our continuing mission to provide you with exceptional heart care, we have created designated Provider Care Teams.  These Care Teams include your primary Cardiologist (physician) and Advanced Practice Providers (APPs -  Physician Assistants and Nurse Practitioners) who all work together to provide you with the care you need, when you need it.     Your next appointment:   3 month(s)  The format for your next appointment:   In Person  Provider:   Donato Heinz, MD    You have been referred to  Neurology - [facility] Neurology ) to  discuss if anticoagulant can be used if necessary    Other Instructions   Monitor your blood pressure for one week - check it  twice a day . Contact the office with the reading   [place] Instructions  Your physician has requested you wear a ZIO patch monitor for 14 days.  This is a single patch monitor. [employer] supplies one patch monitor per enrollment. Additional stickers are not available. Please do not apply patch if you will be having a Nuclear Stress Test,  Echocardiogram, Cardiac CT, MRI, or Chest Xray during the period you would be wearing the  monitor. The patch cannot be worn during these tests. You cannot remove and re-apply the  ZIO XT patch monitor.  Your ZIO patch monitor will be mailed 3 day USPS to your address on file. It may take 3-5 days  to receive your monitor after you have been enrolled.  Once you have received your monitor, please review the enclosed instructions. Your monitor  has already been registered assigning a specific monitor serial # to you.  Billing and Patient Assistance Program Information  We have supplied [employer] with any of your insurance information on file for billing purposes. [employer] offers a sliding scale Patient Assistance Program for patients that do not have  insurance, or whose insurance does not completely cover the cost of the ZIO monitor.  You must apply for the Patient Assistance Program to qualify for this discounted rate.  To apply, please call [employer] at [phone],  select option 4, select option 2, ask to apply for  Patient Assistance Program. Theodore Demark will ask your household income, and how many people  are in your household. They will quote your out-of-pocket cost based on that information.  [employer] will also be able to set up a 10-month, interest-free payment plan if needed.  Applying the monitor   Shave hair from upper left chest.  Hold abrader disc by orange tab. Rub abrader in 40 strokes over the upper  left chest as  indicated in your monitor instructions.  Clean area with 4 enclosed alcohol pads. Let dry.  Apply patch as indicated in monitor instructions. Patch will be placed under collarbone on left  side of chest with arrow pointing upward.  Rub patch adhesive wings for 2 minutes. Remove white label marked "1". Remove the white  label marked "2". Rub patch adhesive wings for 2 additional minutes.  While looking in a mirror, press and release button in center of patch. A small green light will  flash 3-4 times. This will be your only indicator that the monitor has been turned on.  Do not shower for the first 24 hours. You may shower after the first 24 hours.  Press the button if you feel a symptom. You will hear a small click. Record Date, Time and  Symptom in the Patient Logbook.  When you are ready to remove the patch, follow instructions on the last 2 pages of Patient  Logbook. Stick patch monitor onto the last page of Patient Logbook.  Place Patient Logbook in the blue and white box. Use locking tab on box and tape box closed  securely. The blue and white box has prepaid postage on it. Please place it in the mailbox as  soon as possible. Your physician should have your test results approximately 7 days after the  monitor has been mailed back to [facility].  Call [place] at [phone] if you have questions regarding  your ZIO XT patch monitor. Call them immediately if you see an orange light blinking on your  monitor.  If your monitor falls off in less than 4 days, contact our Monitor department at [phone].  If your monitor becomes loose or falls off after 4 days call [employer] at [phone] for  suggestions on securing your monitor

## 2021-08-21 NOTE — Progress Notes (Unsigned)
Enrolled for [employer] to mail a ZIO XT long term holter monitor to the patients address on file.  

## 2021-08-23 ENCOUNTER — Ambulatory Visit (HOSPITAL_COMMUNITY)
Admission: RE | Admit: 2021-08-23 | Discharge: 2021-08-23 | Disposition: A | Discharge status: Home / Self Care | Payer: Commercial Managed Care - PPO | Source: Ambulatory Visit | Attending: Interventional Radiology | Admitting: Interventional Radiology

## 2021-08-23 ENCOUNTER — Encounter (HOSPITAL_COMMUNITY): Payer: Self-pay

## 2021-08-23 ENCOUNTER — Other Ambulatory Visit: Payer: Self-pay

## 2021-08-23 DIAGNOSIS — I671 Cerebral aneurysm, nonruptured: Secondary | ICD-10-CM | POA: Diagnosis not present

## 2021-08-27 DIAGNOSIS — I480 Paroxysmal atrial fibrillation: Secondary | ICD-10-CM

## 2021-08-27 DIAGNOSIS — R002 Palpitations: Secondary | ICD-10-CM

## 2021-08-29 ENCOUNTER — Other Ambulatory Visit (HOSPITAL_COMMUNITY): Payer: Self-pay | Admitting: Interventional Radiology

## 2021-08-29 ENCOUNTER — Telehealth (HOSPITAL_COMMUNITY): Payer: Self-pay

## 2021-08-29 ENCOUNTER — Encounter: Payer: Self-pay | Admitting: Cardiology

## 2021-08-29 DIAGNOSIS — I671 Cerebral aneurysm, nonruptured: Secondary | ICD-10-CM

## 2021-08-29 NOTE — Telephone Encounter (Signed)
Called to schedule consult, no answer, left vm. AW  

## 2021-09-05 ENCOUNTER — Encounter: Payer: Self-pay | Admitting: Cardiology

## 2021-09-11 ENCOUNTER — Ambulatory Visit (HOSPITAL_COMMUNITY)
Admission: RE | Admit: 2021-09-11 | Discharge: 2021-09-11 | Disposition: A | Discharge status: Home / Self Care | Payer: Commercial Managed Care - PPO | Source: Ambulatory Visit | Attending: Interventional Radiology | Admitting: Interventional Radiology

## 2021-09-11 ENCOUNTER — Other Ambulatory Visit: Payer: Self-pay

## 2021-09-11 DIAGNOSIS — I671 Cerebral aneurysm, nonruptured: Secondary | ICD-10-CM

## 2021-09-13 HISTORY — PX: IR RADIOLOGIST EVAL & MGMT: IMG5224

## 2021-09-18 ENCOUNTER — Ambulatory Visit (HOSPITAL_COMMUNITY): Payer: Commercial Managed Care - PPO | Attending: Internal Medicine

## 2021-09-18 ENCOUNTER — Other Ambulatory Visit: Payer: Self-pay

## 2021-09-18 DIAGNOSIS — I480 Paroxysmal atrial fibrillation: Secondary | ICD-10-CM

## 2021-09-18 LAB — ECHOCARDIOGRAM COMPLETE
Area-P 1/2: 3.51 cm2
S' Lateral: 2.5 cm

## 2021-09-19 ENCOUNTER — Other Ambulatory Visit: Payer: Self-pay | Admitting: Internal Medicine

## 2021-10-05 ENCOUNTER — Encounter: Payer: Self-pay | Admitting: Cardiology

## 2021-10-05 ENCOUNTER — Telehealth: Payer: Self-pay | Admitting: Cardiology

## 2021-10-05 MED ORDER — METOPROLOL TARTRATE 25 MG PO TABS: 25.0000 mg | ORAL_TABLET | Freq: Two times a day (BID) | ORAL | 3 refills | Status: DC

## 2021-10-05 NOTE — Telephone Encounter (Signed)
Attempted to contact patient back to discuss results.  ?Patient did not answer, LVM to call back.  ?Left call back number.  ? ?

## 2021-10-05 NOTE — Telephone Encounter (Signed)
Pt returning a phone call for long term monitor results.. please advise ?

## 2021-10-06 ENCOUNTER — Encounter: Payer: Self-pay | Admitting: Cardiology

## 2021-10-06 ENCOUNTER — Encounter: Payer: Self-pay | Admitting: Diagnostic Neuroimaging

## 2021-10-06 ENCOUNTER — Ambulatory Visit: Payer: Commercial Managed Care - PPO | Admitting: Diagnostic Neuroimaging

## 2021-10-06 VITALS — BP 132/75 | HR 63 | Ht 67.0 in | Wt 182.4 lb

## 2021-10-06 DIAGNOSIS — I609 Nontraumatic subarachnoid hemorrhage, unspecified: Secondary | ICD-10-CM | POA: Diagnosis not present

## 2021-10-06 DIAGNOSIS — I725 Aneurysm of other precerebral arteries: Secondary | ICD-10-CM | POA: Diagnosis not present

## 2021-10-06 NOTE — Progress Notes (Signed)
? ?[facility] ? ?PATIENT: Shannon Yu ?DOB: Dec 23, 1955 ? ?REFERRING CLINICIAN: Donato Heinz* ?HISTORY FROM: patient  ?REASON FOR VISIT: new consult  ? ? ?HISTORICAL ? ?CHIEF COMPLAINT:  ?Chief Complaint  ?Patient presents with  ? New Patient (Initial Visit)  ?  RM 6, alone. Referral for brain anuersym, SAH, HTN (recently put on metoprolol for this). Here to discuss anticoagulation therapy options.  ? ? ?HISTORY OF PRESENT ILLNESS:  ? ?66 year old female here for evaluation of history of subarachnoid hemorrhage in 2013, status post endovascular coiling initially in 2013, followed by additional treatments in 2014 and 2015 with stenting. ? 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  During her hospitalization she was found to be in atrial fibrillation.  Now patient is under care of cardiology and consideration of anticoagulation is being questioned given her history of prior subarachnoid hemorrhage. ? ?Patient has been to interventional neuroradiology Dr. 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 ? ? ?REVIEW OF SYSTEMS: Full 14 system review of systems performed and negative with exception of: as per HPI. ? ?ALLERGIES: ?No Known Allergies ? ?HOME MEDICATIONS: ?Outpatient Medications Prior to Visit  ?Medication Sig Dispense Refill  ? aspirin 325 MG tablet Take 325 mg by mouth daily.    ? cetirizine (ZYRTEC) 10 MG tablet Take 10 mg by mouth daily.    ? hydrochlorothiazide (HYDRODIURIL) 12.5 MG tablet TAKE 1 TABLET DAILY. OFFICE VISIT REQUIRED PRIOR TO ANY FURTHER REFILLS 30 tablet 0  ? Omega-3 Fatty Acids (FISH OIL PO) Take 1 capsule by mouth daily.    ? omeprazole (PRILOSEC) 40 MG capsule TAKE 1 CAPSULE(40 MG) BY MOUTH DAILY**NEEDS APT FOR FURTHER  REFILLS** 60 capsule 0  ? VITAMIN D, CHOLECALCIFEROL, PO Take 25 mcg by mouth daily.    ? zolpidem (AMBIEN) 5 MG tablet Take 1 tablet (5 mg total) by mouth at bedtime as needed for sleep. 30 tablet 5  ? metoprolol tartrate (LOPRESSOR) 25 MG tablet Take 1 tablet (25 mg total) by mouth 2 (two) times daily. (Patient not taking: Reported on 10/06/2021) 60 tablet 3  ? ?No facility-administered medications prior to visit.  ? ? ?PAST MEDICAL HISTORY: ?Past Medical History:  ?Diagnosis Date  ? Brain aneurysm 05/09/12  ? coil/stent, angio with stent 02/2014  ? Cervical cancer ([place])   ? Diverticulosis   ? Environmental allergies   ? History of shingles   ? HTN (hypertension)   ? Menopause   ? Migraine   ? Osteoarthritis   ? left knee  ? Personal history of colonic polyps   ? noted on 2012 colonoscopy  ? Sleep apnea 06/2011 dx  ? CPAP 13/apria  ? Stroke (cerebrum) ([place])   ? Vertigo   ? ? ?PAST SURGICAL HISTORY: ?Past Surgical History:  ?Procedure Laterality Date  ? ANEURYSM COILING  05/09/2012  ? middle  ? Brain stent  2013, 2015  ? BREAST BIOPSY Right 1988  ? BENIGN  ? BREAST BIOPSY Left 1993  ? BENIGN  ? COLONOSCOPY  12/13/2020  ? IR RADIOLOGIST EVAL & MGMT  09/13/2021  ? KNEE ARTHROSCOPY Left   ? RADIOLOGY WITH ANESTHESIA N/A 09/03/2012  ? Procedure: RADIOLOGY WITH ANESTHESIA;  Surgeon: Rob Hickman, MD;  Location: [place];  Service: Radiology;  Laterality: N/A;  aneurysm embolization   ? RADIOLOGY WITH ANESTHESIA N/A 02/10/2014  ? Procedure: RADIOLOGY WITH ANESTHESIA, emobolization;  Surgeon: Rob Hickman, MD;  Location: [place];  Service: Radiology;  Laterality: N/A;  ? ? ?FAMILY HISTORY: ?Family History  ?Problem Relation Age of Onset  ? Emphysema Father   ?     smoker  ? Lung cancer Father 75  ? Prostate cancer Father   ? Kidney cancer Father   ? Scleroderma Mother 79  ? Heart attack Maternal Grandmother   ? Colon polyps Neg Hx   ? Colon cancer Neg Hx   ? Esophageal cancer Neg Hx   ? Stomach cancer Neg Hx   ? Rectal  cancer Neg Hx   ? ? ?SOCIAL HISTORY: ?Social History  ? ?Socioeconomic History  ? Marital status: Significant Other  ?  Spouse name: Not on file  ? Number of children: 1  ? Years of education: College degree  ? Highest education level: Not on file  ?Occupational History  ? Occupation: PERSONNEL  ?  Employer: [employer]  ?Tobacco Use  ? Smoking status: Former  ?  Packs/day: 0.25  ?  Years: 10.00  ?  Pack years: 2.50  ?  Types: Cigarettes  ?  Start date: 66  ?  Quit date: 07/10/2011  ?  Years since quitting: 10.2  ? Smokeless tobacco: Never  ?Vaping Use  ? Vaping Use: Never used  ?Substance and Sexual Activity  ? Alcohol use: Yes  ?  Comment: 1 glass of wine ocassionally  ? Drug use: No  ? Sexual activity: Not Currently  ?Other Topics Concern  ? Not on file  ?Social History Narrative  ? 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 Right handed  ? ?Social Determinants of Health  ? ?Financial Resource Strain: Not on file  ?Food Insecurity: Not on file  ?Transportation Needs: Not on file  ?Physical Activity: Not on file  ?Stress: Not on file  ?Social Connections: Not on file  ?Intimate Partner Violence: Not on file  ? ? ? ?PHYSICAL EXAM ? ?GENERAL EXAM/CONSTITUTIONAL: ?Vitals:  ?Vitals:  ? 10/06/21 0914  ?BP: 132/75  ?Pulse: 63  ?Weight: 182 lb 6.4 oz (82.7 kg)  ?Height: '5\' 7"'$  (1.702 m)  ? ?Body mass index is 28.57 kg/m?. ?Wt Readings from Last 3 Encounters:  ?10/06/21 182 lb 6.4 oz (82.7 kg)  ?08/21/21 191 lb 12.8 oz (87 kg)  ?02/23/21 185 lb (83.9 kg)  ? ?Patient is in no distress; well developed, nourished and groomed; neck is supple ? ?CARDIOVASCULAR: ?Examination of carotid arteries is normal; no carotid bruits ?Regular rate and rhythm, no murmurs ?Examination of peripheral vascular system by observation and palpation is normal ? ?EYES: ?Ophthalmoscopic exam of optic discs and posterior segments is normal; no papilledema or hemorrhages ?No results found. ? ?MUSCULOSKELETAL: ?Gait, strength,  tone, movements noted in Neurologic exam below ? ?NEUROLOGIC: ?MENTAL STATUS:  ?   ? View : No data to display.  ?  ?  ?  ? ?awake, alert, oriented to person, place and time ?recent and remote memory intact ?normal attention and concentration ?language fluent, comprehension intact, naming intact ?fund of knowledge appropriate ? ?CRANIAL NERVE:  ?2nd - no papilledema on fundoscopic exam ?2nd, 3rd, 4th, 6th - pupils equal and reactive to light, visual fields full to confrontation, extraocular muscles intact, no nystagmus ?5th - facial sensation symmetric ?7th - facial strength symmetric ?8th - hearing intact ?9th -  palate elevates symmetrically, uvula midline ?11th - shoulder shrug symmetric ?12th - tongue protrusion midline ? ?MOTOR:  ?normal bulk and tone, full strength in the BUE, BLE ? ?SENSORY:  ?normal and symmetric to light touch, temperature, vibration ? ?COORDINATION:  ?finger-nose-finger, fine finger movements normal ? ?REFLEXES:  ?deep tendon reflexes present and symmetric ? ?GAIT/STATION:  ?narrow based gait ? ? ? ? ?DIAGNOSTIC DATA (LABS, IMAGING, TESTING) ?- I reviewed patient records, labs, notes, testing and imaging myself where available. ? ?Lab Results  ?Component Value Date  ? WBC 4.9 03/31/2019  ? HGB 13.5 03/31/2019  ? HCT 38.4 03/31/2019  ? MCV 97 03/31/2019  ? PLT 231 03/31/2019  ? ?   ?Component Value Date/Time  ? NA 142 07/31/2019 0826  ? K 4.0 07/31/2019 0826  ? CL 103 07/31/2019 0826  ? CO2 25 07/31/2019 0826  ? GLUCOSE 83 07/31/2019 0826  ? GLUCOSE 98 09/23/2015 1538  ? BUN 9 07/31/2019 0826  ? CREATININE 0.89 07/31/2019 0826  ? CALCIUM 10.0 07/31/2019 0826  ? PROT 7.1 01/25/2020 0836  ? ALBUMIN 4.1 01/25/2020 0836  ? AST 35 01/25/2020 0836  ? ALT 39 (H) 01/25/2020 0836  ? ALKPHOS 106 01/25/2020 0836  ? BILITOT 0.3 01/25/2020 0836  ? GFRNONAA 70 07/31/2019 0826  ? GFRAA 80 07/31/2019 0826  ? ?Lab Results  ?Component Value Date  ? CHOL 211 (H) 12/08/2019  ? HDL 64 12/08/2019  ? LDLCALC 134  (H) 12/08/2019  ? TRIG 71 12/08/2019  ? CHOLHDL 3.3 12/08/2019  ? ?Lab Results  ?Component Value Date  ? HGBA1C 5.8 (H) 03/31/2019  ? ?No results found for: VITAMINB12 ?Lab Results  ?Component Value Date  ? T

## 2021-10-12 NOTE — Telephone Encounter (Signed)
See my chart message

## 2021-10-20 ENCOUNTER — Encounter: Payer: Self-pay | Admitting: Cardiology

## 2021-11-13 NOTE — Progress Notes (Signed)
?Cardiology Office Note:   ? ?Date:  11/16/2021  ? ?ID:  Shannon Yu, DOB 1956/07/03, MRN [id number] ? ?PCP:  Lilian Coma., MD  ?Cardiologist:  Donato Heinz, MD  ?Electrophysiologist:  None  ? ?Referring MD: Lilian Coma., MD  ? ?Chief Complaint  ?Patient presents with  ? Atrial Fibrillation  ? ? ?History of Present Illness:   ? ?Shannon Yu is a 66 y.o. female with a hx of brain aneurysm, SAH, cervical cancer, OSA who presents for follow-up.  She was referred by Dr. Valora Piccolo for evaluation of atrial fibrillation, initially seen on 08/21/2021.  She recently underwent removal of a mass on her arm and during the procedure was found to be in atrial fibrillation.  She does have a history of a subarachnoid hemorrhage.  She was admitted in November 2013 with subarachnoid hemorrhage.  CT a showed aneurysm and she was treated with coil embolization.  She was found on angiogram in 2014 to have remnant of aneurysm and underwent additional coiling and stent placement 08/2012.  Required further stenting 02/2014. ? ?She reports that she had schwannoma removal and was told she had A-fib during the procedure.  She is not sure how long the A-fib lasted.  She reports rare palpitations where feels like heart is racing, occurs few times per year and lasts minutes.  Denies any lightheadedness, syncope, or chest pain.  Does report some dyspnea.  No lower extremity edema.  Reports BP usually well controlled but has not been checking recently.  Smoked for 15 years about 1 pack/day, quit in 2013.  Family history includes mother had Wolff-Parkinson-White syndrome. ? ?Echocardiogram on 09/18/2021 showed normal biventricular function, no significant valvular disease.  Zio patch x14 days on 09/18/2021 showed 3% atrial fibrillation burden, average rate 127 bpm with longest episode lasting 34 minutes. ? ?Since last clinic visit, she reports that she is doing well.  Denies any chest pain, dyspnea,  lower extremity edema, or palpitations.   Reports some lightheadedness but denies any syncope.  Occasional palpitations. ? ? ? ? ?Past Medical History:  ?Diagnosis Date  ? Brain aneurysm 05/09/12  ? coil/stent, angio with stent 02/2014  ? Cervical cancer ([place])   ? Diverticulosis   ? Environmental allergies   ? History of shingles   ? HTN (hypertension)   ? Menopause   ? Migraine   ? Osteoarthritis   ? left knee  ? Personal history of colonic polyps   ? noted on 2012 colonoscopy  ? Sleep apnea 06/2011 dx  ? CPAP 13/apria  ? Stroke (cerebrum) ([place])   ? Vertigo   ? ? ?Past Surgical History:  ?Procedure Laterality Date  ? ANEURYSM COILING  05/09/2012  ? middle  ? Brain stent  2013, 2015  ? BREAST BIOPSY Right 1988  ? BENIGN  ? BREAST BIOPSY Left 1993  ? BENIGN  ? COLONOSCOPY  12/13/2020  ? IR RADIOLOGIST EVAL & MGMT  09/13/2021  ? KNEE ARTHROSCOPY Left   ? RADIOLOGY WITH ANESTHESIA N/A 09/03/2012  ? Procedure: RADIOLOGY WITH ANESTHESIA;  Surgeon: Rob Hickman, MD;  Location: [place];  Service: Radiology;  Laterality: N/A;  aneurysm embolization   ? RADIOLOGY WITH ANESTHESIA N/A 02/10/2014  ? Procedure: RADIOLOGY WITH ANESTHESIA, emobolization;  Surgeon: Rob Hickman, MD;  Location: [place];  Service: Radiology;  Laterality: N/A;  ? ? ?Current Medications: ?Current Meds  ?Medication Sig  ? apixaban (ELIQUIS) 5 MG TABS tablet Take 1 tablet (5 mg total)  by mouth 2 (two) times daily.  ? cetirizine (ZYRTEC) 10 MG tablet Take 10 mg by mouth daily.  ? Cholecalciferol 125 MCG (5000 UT) TABS Take by mouth.  ? hydrochlorothiazide (HYDRODIURIL) 12.5 MG tablet TAKE 1 TABLET DAILY. OFFICE VISIT REQUIRED PRIOR TO ANY FURTHER REFILLS  ? metoprolol tartrate (LOPRESSOR) 25 MG tablet Take 1 tablet (25 mg total) by mouth 2 (two) times daily.  ? Omega-3 Fatty Acids (FISH OIL PO) Take 1 capsule by mouth daily.  ? omeprazole (PRILOSEC) 40 MG capsule TAKE 1 CAPSULE(40 MG) BY MOUTH DAILY**NEEDS APT FOR FURTHER REFILLS**  ? VITAMIN D, CHOLECALCIFEROL, PO Take 25 mcg by mouth daily.   ? zolpidem (AMBIEN) 5 MG tablet Take 1 tablet (5 mg total) by mouth at bedtime as needed for sleep.  ? [DISCONTINUED] aspirin 325 MG tablet Take 325 mg by mouth daily.  ?  ? ?Allergies:   Patient has no known allergies.  ? ?Social History  ? ?Socioeconomic History  ? Marital status: Significant Other  ?  Spouse name: Not on file  ? Number of children: 1  ? Years of education: College degree  ? Highest education level: Not on file  ?Occupational History  ? Occupation: PERSONNEL  ?  Employer: [employer]  ?Tobacco Use  ? Smoking status: Former  ?  Packs/day: 0.25  ?  Years: 10.00  ?  Pack years: 2.50  ?  Types: Cigarettes  ?  Start date: 18  ?  Quit date: 07/10/2011  ?  Years since quitting: 10.3  ? Smokeless tobacco: Never  ?Vaping Use  ? Vaping Use: Never used  ?Substance and Sexual Activity  ? Alcohol use: Yes  ?  Comment: 1 glass of wine ocassionally  ? Drug use: No  ? Sexual activity: Not Currently  ?Other Topics Concern  ? Not on file  ?Social History Narrative  ? 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 Right handed  ? ?Social Determinants of Health  ? ?Financial Resource Strain: Not on file  ?Food Insecurity: Not on file  ?Transportation Needs: Not on file  ?Physical Activity: Not on file  ?Stress: Not on file  ?Social Connections: Not on file  ?  ? ?Family History: ?The patient's family history includes Emphysema in her father; Heart attack in her maternal grandmother; Kidney cancer in her father; Lung cancer (age of onset: 87) in her father; Prostate cancer in her father; Scleroderma (age of onset: 16) in her mother. There is no history of Colon polyps, Colon cancer, Esophageal cancer, Stomach cancer, or Rectal cancer. ? ?ROS:   ?Please see the history of present illness.    ? All other systems reviewed and are negative. ? ?EKGs/Labs/Other Studies Reviewed:   ? ?The following studies were reviewed today: ? ? ?EKG:   ?07/20/21: Atrial fibrillation, rate 137, diffuse ST depressions with  aVR elevation ?08/21/2021: Normal sinus rhythm, rate 60, nonspecific T wave flattening ?11/16/21: Sinus bradycardia, rate 59, nonspecific T wave flattening ? ? ?Recent Labs: ?No results found for requested labs within last 8760 hours.  ?Recent Lipid Panel ?   ?Component Value Date/Time  ? CHOL 211 (H) 12/08/2019 2595  ? TRIG 71 12/08/2019 0842  ? HDL 64 12/08/2019 0842  ? CHOLHDL 3.3 12/08/2019 0842  ? CHOLHDL 4 06/09/2014 0818  ? VLDL 11.0 06/09/2014 0818  ? LDLCALC 134 (H) 12/08/2019 6387  ? ? ?Physical Exam:   ? ?VS:  BP 115/70   Pulse (!) 59  Ht '5\' 7"'$  (1.702 m)   Wt 178 lb 12.8 oz (81.1 kg)   SpO2 97%   BMI 28.00 kg/m?    ? ?Wt Readings from Last 3 Encounters:  ?11/16/21 178 lb 12.8 oz (81.1 kg)  ?10/06/21 182 lb 6.4 oz (82.7 kg)  ?08/21/21 191 lb 12.8 oz (87 kg)  ?  ? ?GEN:  Well nourished, well developed in no acute distress ?HEENT: Normal ?NECK: No JVD; No carotid bruits ?LYMPHATICS: No lymphadenopathy ?CARDIAC: RRR, no murmurs, rubs, gallops ?RESPIRATORY:  Clear to auscultation without rales, wheezing or rhonchi  ?ABDOMEN: Soft, non-tender, non-distended ?MUSCULOSKELETAL:  No edema; No deformity  ?SKIN: Warm and dry ?NEUROLOGIC:  Alert and oriented x 3 ?PSYCHIATRIC:  Normal affect  ? ?ASSESSMENT:   ? ?1. Paroxysmal atrial fibrillation (HCC)   ?2. Essential hypertension   ? ? ?PLAN:   ? ?Atrial fibrillation: Diagnosed initially while under general anesthesia for removal of skin mass.  Rates up to 130s.  Echocardiogram on 09/18/2021 showed normal biventricular function, no significant valvular disease.  Zio patch x14 days on 09/18/2021 showed 3% atrial fibrillation burden, average rate 127 bpm with longest episode lasting 34 minutes. ?-Continue metoprolol 25 mg twice daily for rate control ?-Referred to neurology as well as discussed with Dr. Estanislado Pandy about anticoagulation given her history of SAH/brain aneurysm, in agreement that she is okay to start anticoagulation.  Will start Eliquis 5 mg twice daily,  can stop aspirin ?-She is interested in ablation, will refer to EP to discuss ? ?Hypertension: On hydrochlorothiazide 12.5 mg daily and metoprolol 25 mg twice daily.  Appears controlled ? ?OSA: on CPAP, re

## 2021-11-16 ENCOUNTER — Ambulatory Visit (INDEPENDENT_AMBULATORY_CARE_PROVIDER_SITE_OTHER): Payer: Commercial Managed Care - PPO | Admitting: Cardiology

## 2021-11-16 ENCOUNTER — Encounter: Payer: Self-pay | Admitting: Cardiology

## 2021-11-16 VITALS — BP 115/70 | HR 59 | Ht 67.0 in | Wt 178.8 lb

## 2021-11-16 DIAGNOSIS — I480 Paroxysmal atrial fibrillation: Secondary | ICD-10-CM

## 2021-11-16 DIAGNOSIS — I10 Essential (primary) hypertension: Secondary | ICD-10-CM

## 2021-11-16 MED ORDER — APIXABAN 5 MG PO TABS: 5.0000 mg | ORAL_TABLET | Freq: Two times a day (BID) | ORAL | 3 refills | Status: DC

## 2021-11-16 NOTE — Patient Instructions (Signed)
Medication Instructions:  ?START Eliquis 5 mg two times daily ?STOP aspirin ? ?*If you need a refill on your cardiac medications before your next appointment, please call your pharmacy* ? ?Follow-Up: ?At [facility], you and your health needs are our priority.  As part of our continuing mission to provide you with exceptional heart care, we have created designated Provider Care Teams.  These Care Teams include your primary Cardiologist (physician) and Advanced Practice Providers (APPs -  Physician Assistants and Nurse Practitioners) who all work together to provide you with the care you need, when you need it. ? ?We recommend signing up for the patient portal called "MyChart".  Sign up information is provided on this After Visit Summary.  MyChart is used to connect with patients for Virtual Visits (Telemedicine).  Patients are able to view lab/test results, encounter notes, upcoming appointments, etc.  Non-urgent messages can be sent to your provider as well.   ?To learn more about what you can do with MyChart, go to [email or web address].   ? ?Your next appointment:   ?6 month(s) ? ?The format for your next appointment:   ?In Person ? ?Provider:   ?Donato Heinz, MD { ? ? ? ? ?Other Instructions ?You have been referred to: Dr. Quentin Ore to discuss ablation ? ? ?Important Information About Sugar ? ? ? ? ? ? ?

## 2021-11-17 ENCOUNTER — Telehealth: Payer: Self-pay | Admitting: *Deleted

## 2021-11-17 NOTE — Telephone Encounter (Signed)
PA submitted via Covermymeds for Eliquis ? ?[id number] ?Status:Approved ?Coverage Start Date:10/18/2021 ?Coverage End Date:11/17/2022 ?

## 2021-12-28 ENCOUNTER — Other Ambulatory Visit: Payer: Self-pay | Admitting: Cardiology

## 2022-01-04 ENCOUNTER — Ambulatory Visit (INDEPENDENT_AMBULATORY_CARE_PROVIDER_SITE_OTHER): Payer: Commercial Managed Care - PPO | Admitting: Cardiology

## 2022-01-04 ENCOUNTER — Encounter: Payer: Self-pay | Admitting: Cardiology

## 2022-01-04 VITALS — BP 118/68 | HR 61 | Ht 67.0 in | Wt 178.0 lb

## 2022-01-04 DIAGNOSIS — I10 Essential (primary) hypertension: Secondary | ICD-10-CM

## 2022-01-04 DIAGNOSIS — I480 Paroxysmal atrial fibrillation: Secondary | ICD-10-CM

## 2022-01-04 DIAGNOSIS — G4733 Obstructive sleep apnea (adult) (pediatric): Secondary | ICD-10-CM

## 2022-01-04 DIAGNOSIS — I609 Nontraumatic subarachnoid hemorrhage, unspecified: Secondary | ICD-10-CM

## 2022-01-04 DIAGNOSIS — Z9989 Dependence on other enabling machines and devices: Secondary | ICD-10-CM

## 2022-01-04 NOTE — Progress Notes (Signed)
Electrophysiology Office Note:    Date:  01/04/2022   ID:  Shannon, Yu 25-Oct-1955, MRN [id number]  PCP:  Lilian Coma., MD  [facility] Cardiologist:  Donato Heinz, MD  [facility] Electrophysiologist:  Vickie Epley, MD   Referring MD: Donato Heinz*   Chief Complaint: New patient consult for Afib ablation  History of Present Illness:    Shannon Yu is a 66 y.o. female who presents for an evaluation for Afib ablation at the request of Dr. Gardiner Rhyme. Their medical history includes [place] 2013, brain aneurysm s/p stent 2014 and further stenting 2015, stroke, hypertension, cervical cancer, shingles, and sleep apnea on CPAP.  During a procedure for schwannoma removal 07/20/2021 she was told that she had atrial fibrillation with rates up to 130s.  She saw Dr. Gardiner Rhyme on 11/16/2021. Zio patch x14 days on 09/18/2021 showed 3% atrial fibrillation burden, average rate 127 bpm with longest episode lasting 34 minutes. She was continued on metoprolol 25 mg BID for rate control. Discussed with Dr. Estanislado Pandy about anticoagulation given her history of SAH/brain aneurysm, in agreement that she is okay to start anticoagulation. She was started on Eliquis 5 mg twice daily and ASA was stopped. She was also interested in pursuing ablation, so she was referred to EP for further evaluation.  Today, she is accompanied by a family member. She notes she had been on 325 mg ASA prior to Eliquis. At this time she has been on Eliquis for 3-4 weeks.  She reports having 3 episodes of atrial fibrillation. Once was during a vacation and she attributed her arrhythmia to the heat. Most recently she had an episode last night that felt like "flopping", and made her feel nervous. Usually her heart rate feels fast as well. This lasted for less than an hour.  For activity she enjoys playing tennis.   During her previous SAH, she had initially experienced tunnel vision, and "lit up triangles"  in her field of vision. She returns every 2 years for a brain MRI/MRA (last 08/2021).  They deny any chest pain, shortness of breath, or peripheral edema. No lightheadedness, headaches, syncope, orthopnea, or PND.     Past Medical History:  Diagnosis Date   Brain aneurysm 05/09/12   coil/stent, angio with stent 02/2014   Cervical cancer ([place])    Diverticulosis    Environmental allergies    History of shingles    HTN (hypertension)    Menopause    Migraine    Osteoarthritis    left knee   Personal history of colonic polyps    noted on 2012 colonoscopy   Sleep apnea 06/2011 dx   CPAP 13/apria   Stroke (cerebrum) (HCC)    Vertigo     Past Surgical History:  Procedure Laterality Date   ANEURYSM COILING  05/09/2012   middle   Brain stent  2013, 2015   BREAST BIOPSY Right 1988   BENIGN   BREAST BIOPSY Left 1993   BENIGN   COLONOSCOPY  12/13/2020   IR RADIOLOGIST EVAL & MGMT  09/13/2021   KNEE ARTHROSCOPY Left    RADIOLOGY WITH ANESTHESIA N/A 09/03/2012   Procedure: RADIOLOGY WITH ANESTHESIA;  Surgeon: Rob Hickman, MD;  Location: [place];  Service: Radiology;  Laterality: N/A;  aneurysm embolization    RADIOLOGY WITH ANESTHESIA N/A 02/10/2014   Procedure: RADIOLOGY WITH ANESTHESIA, emobolization;  Surgeon: Rob Hickman, MD;  Location: [place];  Service: Radiology;  Laterality: N/A;  Current Medications: Current Meds  Medication Sig   apixaban (ELIQUIS) 5 MG TABS tablet Take 1 tablet (5 mg total) by mouth 2 (two) times daily.   cetirizine (ZYRTEC) 10 MG tablet Take 10 mg by mouth daily.   Cholecalciferol 125 MCG (5000 UT) TABS Take by mouth.   hydrochlorothiazide (HYDRODIURIL) 12.5 MG tablet TAKE 1 TABLET DAILY. OFFICE VISIT REQUIRED PRIOR TO ANY FURTHER REFILLS   metoprolol tartrate (LOPRESSOR) 25 MG tablet TAKE 1 TABLET(25 MG) BY MOUTH TWICE DAILY   Omega-3 Fatty Acids (FISH OIL PO) Take 1 capsule by mouth daily.   omeprazole (PRILOSEC) 40 MG capsule TAKE 1  CAPSULE(40 MG) BY MOUTH DAILY**NEEDS APT FOR FURTHER REFILLS**   VITAMIN D, CHOLECALCIFEROL, PO Take 25 mcg by mouth daily.   zolpidem (AMBIEN) 5 MG tablet Take 1 tablet (5 mg total) by mouth at bedtime as needed for sleep.     Allergies:   Patient has no known allergies.   Social History   Socioeconomic History   Marital status: Significant Other    Spouse name: Not on file   Number of children: 1   Years of education: College degree   Highest education level: Not on file  Occupational History   Occupation: PERSONNEL    Employer: [employer]  Tobacco Use   Smoking status: Former    Packs/day: 0.25    Years: 10.00    Total pack years: 2.50    Types: Cigarettes    Start date: 53    Quit date: 07/10/2011    Years since quitting: 10.4   Smokeless tobacco: Never  Vaping Use   Vaping Use: Never used  Substance and Sexual Activity   Alcohol use: Yes    Comment: 1 glass of wine ocassionally   Drug use: No   Sexual activity: Not Currently  Other Topics Concern   Not on file  Social History Narrative   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 There is no history of Colon polyps, Colon cancer, Esophageal cancer, Stomach cancer, or Rectal cancer.  ROS:   Please see the history of present illness.    (+) "Flopping" palpitations (+) Anxious feelings All other systems reviewed and are negative.  EKGs/Labs/Other Studies Reviewed:    The  following studies were reviewed today:  09/2021  Monitor: 3% atrial fibrillation burden, average rate 127 bpm. Longest episode lasted 34 minutes with average rate 119 bpm    Patch Wear Time:  13 days and 16 hours (2023-02-19T15:44:59-0500 to 2023-03-05T08:17:38-0500)   Patient had a min HR of 48 bpm, max HR of 186 bpm, and avg HR of 76 bpm. Predominant underlying rhythm was Sinus Rhythm. Atrial Fibrillation occurred (3% burden), ranging from 90-186 bpm (avg of 127 bpm), the longest lasting 33 mins 46 secs with an avg  rate of 119 bpm. Isolated SVEs were rare (<1.0%), SVE Couplets were rare (<1.0%), and SVE Triplets were rare (<1.0%). Isolated VEs  were rare (<1.0%), and no VE Couplets or VE Triplets were present.  1 patient triggered event, corresponding to sinus rhythm  09/18/2021  Echocardiogram:  1. Left ventricular ejection fraction, by estimation, is 65 to 70%. The  left ventricle has normal function. The left ventricle has no regional  wall motion abnormalities. Left ventricular diastolic parameters are  consistent with Grade I diastolic  dysfunction (impaired relaxation).   2. Right ventricular systolic function is low normal. The right  ventricular size is normal.   3. The mitral valve is abnormal. Trivial mitral valve regurgitation.   4. The aortic valve is tricuspid. Aortic valve regurgitation is not  visualized.   Comparison(s): No prior Echocardiogram.    EKG:   EKG is personally reviewed.  01/04/2022:  EKG was not ordered.    Recent Labs: No results found for requested labs within last 365 days.   Recent Lipid Panel    Component Value Date/Time   CHOL 211 (H) 12/08/2019 0842   TRIG 71 12/08/2019 0842   HDL 64 12/08/2019 0842   CHOLHDL 3.3 12/08/2019 0842   CHOLHDL 4 06/09/2014 0818   VLDL 11.0 06/09/2014 0818   LDLCALC 134 (H) 12/08/2019 0842    Physical Exam:    VS:  BP 118/68   Pulse 61   Ht '5\' 7"'$  (1.702 m)   Wt 178 lb (80.7 kg)   SpO2 96%   BMI 27.88  kg/m     Wt Readings from Last 3 Encounters:  01/04/22 178 lb (80.7 kg)  11/16/21 178 lb 12.8 oz (81.1 kg)  10/06/21 182 lb 6.4 oz (82.7 kg)     GEN: Well nourished, well developed in no acute distress HEENT: Normal NECK: No JVD; No carotid bruits LYMPHATICS: No lymphadenopathy CARDIAC: RRR, no murmurs, rubs, gallops RESPIRATORY:  Clear to auscultation without rales, wheezing or rhonchi  ABDOMEN: Soft, non-tender, non-distended MUSCULOSKELETAL:  No edema; No deformity  SKIN: Warm and dry NEUROLOGIC:  Alert and oriented x 3 PSYCHIATRIC:  Normal affect       ASSESSMENT:    1. Paroxysmal atrial fibrillation (HCC)   2. Essential hypertension   3. SAH (subarachnoid hemorrhage) ([place])   4. OSA on CPAP    PLAN:    In order of problems listed above:  #Paroxysmal atrial fibrillation Symptomatic salvos of atrial fibrillation thankfully still fairly low burden.  We discussed the pathophysiology of atrial fibrillation and the associated stroke risk during today's clinic appointment.  She is currently taking Eliquis for stroke prophylaxis and is tolerating this well but desires a long-term stroke mitigation strategy that avoids long-term exposure anticoagulation.  Plan for PVI first then Watchman 6-8 weeks later.  Discussed treatment options today for his AF including antiarrhythmic drug therapy and ablation. Discussed risks, recovery and likelihood of success. Discussed potential need for repeat ablation procedures and antiarrhythmic drugs after an initial ablation. They wish to proceed with scheduling.  Risk, benefits, and alternatives to EP study and radiofrequency ablation for afib were also discussed in detail today. These risks include but are not limited to stroke, bleeding, vascular damage, tamponade, perforation, damage to the esophagus, lungs, and other structures, pulmonary vein stenosis, worsening renal function, and death. The patient understands these risk and wishes to  proceed.  We will therefore proceed with catheter ablation at the next available time.  Carto, ICE, anesthesia are requested for the procedure.  Will also obtain CT PV protocol prior to the procedure to exclude LAA thrombus and further evaluate atrial anatomy.  --------------------  I have seen Milagros Loll in the office today who is being considered for a Watchman left atrial appendage closure device. I believe they will benefit from this procedure given their history of atrial fibrillation, CHA2DS2-VASc score of at least 4 (points for HTN, stroke, gender, age in < 2 months, vascular disease given cerebral aneurysm) and unadjusted ischemic stroke rate of at least 7.2% per year. Unfortunately, the patient is not felt to be a long term anticoagulation candidate secondary to history of cerebral aneurysm and SAH. The patient's chart has been reviewed and I feel that they would be a candidate for short term oral anticoagulation after Watchman implant.   It is my belief that after undergoing a LAA closure procedure, Primrose P Gasser will not need long term anticoagulation which eliminates anticoagulation side effects and major bleeding risk.   Procedural risks for the Watchman implant have been reviewed with the patient including a 0.5% risk of stroke, <1% risk of perforation and <1% risk of device embolization. Other risks include bleeding, vascular damage, tamponade, worsening renal function, and death. The patient understands these risk and wishes to proceed.     The published clinical data on the safety and effectiveness of WATCHMAN include but are not limited to the following: - Holmes DR, Mechele Claude, Sick P et al. for the PROTECT AF Investigators. Percutaneous closure of the left atrial appendage versus warfarin therapy for prevention of stroke in patients with atrial fibrillation: a randomised non-inferiority trial. Lancet 2009; 374: 534-42. Mechele Claude, Doshi SK, Abelardo Diesel D et al. on behalf of  the PROTECT AF Investigators. Percutaneous Left Atrial Appendage Closure for Stroke Prophylaxis in Patients With Atrial Fibrillation 2.3-Year Follow-up of the PROTECT AF (Watchman Left Atrial Appendage System for Embolic Protection in Patients With Atrial Fibrillation) Trial. Circulation 2013; 127:720-729. - Alli O, Doshi S,  Kar S, Reddy VY, Sievert H et al. Quality of Life Assessment in the Randomized PROTECT AF (Percutaneous Closure of the Left Atrial Appendage Versus Warfarin Therapy for Prevention of Stroke in Patients With Atrial Fibrillation) Trial of Patients at Risk for Stroke With Nonvalvular Atrial Fibrillation. J Am Coll Cardiol 2013; 36:6294-7. Vertell Limber DR, Tarri Abernethy, Price M, [place], Sievert H, Doshi S, Huber K, Reddy V. Prospective randomized evaluation of the Watchman left atrial appendage Device in patients with atrial fibrillation versus long-term warfarin therapy; the PREVAIL trial. Journal of the [employer] of Cardiology, Vol. 4, No. 1, 2014, 1-11. - Kar S, Doshi SK, Sadhu A, Horton R, Osorio J et al. Primary outcome evaluation of a next-generation left atrial appendage closure device: results from the PINNACLE FLX trial. Circulation 2021;143(18)1754-1762.    After today's visit with the patient which was dedicated solely for shared decision making visit regarding LAA closure device, the patient decided to proceed with the LAA appendage closure procedure scheduled to be done in the [facility] future at [facility]. Prior to the procedure, I would like to obtain a gated CT scan of the chest with contrast timed for PV/LA visualization.   HAS-BLED score 3 Hypertension Yes  Abnormal renal and liver function (Dialysis, transplant, Cr >2.26 mg/dL /Cirrhosis or Bilirubin >2x Normal or AST/ALT/AP >3x Normal) No  Stroke Yes  Bleeding Yes  Labile INR (Unstable/high INR) No  Elderly (>65) No  Drugs or alcohol (? 8 drinks/week, anti-plt or NSAID) No   CHA2DS2-VASc Score = 5  The  patient's score is based upon: CHF History: 0 HTN History: 1 Diabetes History:  0 Stroke History: 2 Vascular Disease History: 1 Age Score: 0 Gender Score: 1       Total time spent with patient today 60 minutes. This includes reviewing records, evaluating the patient and coordinating care.  Medication Adjustments/Labs and Tests Ordered: Current medicines are reviewed at length with the patient today.  Concerns regarding medicines are outlined above.  No orders of the defined types were placed in this encounter.  No orders of the defined types were placed in this encounter.   I,Mathew Stumpf,acting as a Education administrator for Vickie Epley, MD.,have documented all relevant documentation on the behalf of Vickie Epley, MD,as directed by  Vickie Epley, MD while in the presence of Vickie Epley, MD.  I, Vickie Epley, MD, have reviewed all documentation for this visit. The documentation on 01/04/22 for the exam, diagnosis, procedures, and orders are all accurate and complete.   Signed, Hilton Cork. Quentin Ore, MD, [facility], [facility] 01/04/2022 9:42 PM    Electrophysiology [facility]

## 2022-01-04 NOTE — Patient Instructions (Addendum)
Medication Instructions:  Your physician recommends that you continue on your current medications as directed. Please refer to the Current Medication list given to you today. *If you need a refill on your cardiac medications before your next appointment, please call your pharmacy*  Lab Work: None. If you have labs (blood work) drawn today and your tests are completely normal, you will receive your results only by: [place] (if you have MyChart) OR A paper copy in the mail If you have any lab test that is abnormal or we need to change your treatment, we will call you to review the results.  Testing/Procedures: Your physician has requested that you have cardiac CT. Cardiac computed tomography (CT) is a painless test that uses an x-ray machine to take clear, detailed pictures of your heart. For further information please visit [email or web address].[email or web address]. Please follow instruction sheet as given.   Your physician has recommended that you have an ablation. Catheter ablation is a medical procedure used to treat some cardiac arrhythmias (irregular heartbeats). During catheter ablation, a long, thin, flexible tube is put into a blood vessel in your groin (upper thigh), or neck. This tube is called an ablation catheter. It is then guided to your heart through the blood vessel. Radio frequency waves destroy small areas of heart tissue where abnormal heartbeats may cause an arrhythmia to start. Please see the instruction sheet given to you today.;  Follow-Up: At [facility], you and your health needs are our priority.  As part of our continuing mission to provide you with exceptional heart care, we have created designated Provider Care Teams.  These Care Teams include your primary Cardiologist (physician) and Advanced Practice Providers (APPs -  Physician Assistants and Nurse Practitioners) who all work together to provide you with the care you need, when you need it.  Your physician wants you to  follow-up in: Ablation date Oct 23 and pre op lab date Oct 2. Otila Kluver RN will call you with instructions for your CT and Ablation.   We recommend signing up for the patient portal called "MyChart".  Sign up information is provided on this After Visit Summary.  MyChart is used to connect with patients for Virtual Visits (Telemedicine).  Patients are able to view lab/test results, encounter notes, upcoming appointments, etc.  Non-urgent messages can be sent to your provider as well.   To learn more about what you can do with MyChart, go to [email or web address].    Any Other Special Instructions Will Be Listed Below (If Applicable).  Left Atrial Appendage Closure Device Implantation  Left atrial appendage (LAA) closure device implantation is a procedure to put a small device in the LAA of the heart. The LAA is a small sac in the wall of the heart's left upper chamber. Blood clots can form in the LAA in people with atrial fibrillation (AFib). The device closes the LAA to help prevent a blood clot and stroke. AFib is a type of irregular or rapid heartbeat (arrhythmia). There is an increased risk of blood clots and stroke with AFib. This procedure helps to reduce that risk. Tell a health care provider about: Any allergies you have. All medicines you are taking, including vitamins, herbs, eye drops, creams, and over-the-counter medicines. Any problems you or family members have had with anesthetic medicines. Any blood disorders you have. Any surgeries you have had. Any medical conditions you have. Whether you are pregnant or may be pregnant. What are the risks? Generally, this is a safe procedure. However, problems may  occur, including: Infection. Bleeding. Allergic reactions to medicines or dyes. Damage to nearby structures or organs. Heart attack. Stroke. Blood clots. Changes in heart rhythm. Device failure. What happens before the procedure? Staying hydrated Follow instructions from your  health care provider about hydration, which may include: Up to 2 hours before the procedure - you may continue to drink clear liquids, such as water, clear fruit juice, black coffee, and plain tea. Eating and drinking restrictions Follow instructions from your health care provider about eating and drinking, which may include: 8 hours before the procedure - stop eating heavy meals or foods, such as meat, fried foods, or fatty foods. 6 hours before the procedure - stop eating light meals or foods, such as toast or cereal. 6 hours before the procedure - stop drinking milk or drinks that contain milk. 2 hours before the procedure - stop drinking clear liquids. Medicines Ask your health care provider about: Changing or stopping your regular medicines. This is especially important if you are taking diabetes medicines or blood thinners. Taking medicines such as aspirin and ibuprofen. These medicines can thin your blood. Do not take these medicines unless your health care provider tells you to take them. Taking over-the-counter medicines, vitamins, herbs, and supplements. Tests You may have blood tests and a physical exam. You may have an electrocardiogram (ECG). This test checks your heart's electrical patterns and rhythms. General instructions Do not use any products that contain nicotine or tobacco. These include cigarettes, chewing tobacco, and vaping devices, such as e-cigarettes. If you need help quitting, ask your health care provider. Ask your health care provider what steps will be taken to help prevent infection. These steps may include: Removing hair at the surgery site. Washing skin with a germ-killing soap. Taking antibiotic medicine. Plan to have a responsible adult take you home from the hospital or clinic. Plan to have a responsible adult care for you for the time you are told after you leave the hospital or clinic. This is important. What happens during the procedure? An IV will be  inserted into one of your veins. You will be given one or more of the following: A medicine to help you relax (sedative). A medicine to make you fall asleep (general anesthetic). A small incision will be made in your groin area. A small wire will be put through the incision and into a blood vessel. Dye may be injected so X-rays can be used to guide the wire through the blood vessel. A long, thin tube (catheter) will be put over the small wire and moved up through the blood vessel to reach your heart. The closure device will be moved through the catheter until it reaches your heart. A small hole will be made in the septum (transseptal puncture). The septum is a thin tissue that separates the upper two chambers of the heart. The device will be placed so that it closes the LAA. X-rays will be done to make sure the device is in the right place. The catheter and wire will be removed. The closure device will remain in your heart. After pressure is applied over the catheter site to prevent bleeding, a bandage (dressing) will be placed over the site where the catheter was inserted. The procedure may vary among health care providers and hospitals. What happens after the procedure? Your blood pressure, heart rate, breathing rate, and blood oxygen level will be monitored until you leave the hospital or clinic. You may have to wear compression stockings. These stockings help  to prevent blood clots and reduce swelling in your legs. If you were given a sedative during the procedure, it can affect you for several hours. Do not drive or operate machinery until your health care provider says it is safe. You may be given pain medicine. You may need to drink more fluids to wash (flush) the dye out of your body. Drink enough fluid to keep your urine pale yellow. Take over-the-counter and prescription medicines only as told by your health care provider. This is especially important if you were given blood  thinners. Summary Left atrial appendage (LAA) closure device implantation is a procedure that is done to put a small device in the LAA of the heart. The LAA is a small sac in the wall of the heart's left upper chamber. The device closes the LAA to prevent stroke and other problems. Follow instructions from your health care provider before and after the procedure. This information is not intended to replace advice given to you by your health care provider. Make sure you discuss any questions you have with your health care provider. Document Revised: 03/03/2020 Document Reviewed: 03/03/2020 Elsevier Patient Education  [employer].  Cardiac Ablation Cardiac ablation is a procedure to destroy (ablate) some heart tissue that is sending bad signals. These bad signals cause problems in heart rhythm. The heart has many areas that make these signals. If there are problems in these areas, they can make the heart beat in a way that is not normal. Destroying some tissues can help make the heart rhythm normal. Tell your doctor about: Any allergies you have. All medicines you are taking. These include vitamins, herbs, eye drops, creams, and over-the-counter medicines. Any problems you or family members have had with medicines that make you fall asleep (anesthetics). Any blood disorders you have. Any surgeries you have had. Any medical conditions you have, such as kidney failure. Whether you are pregnant or may be pregnant. What are the risks? This is a safe procedure. But problems may occur, including: Infection. Bruising and bleeding. Bleeding into the chest. Stroke or blood clots. Damage to nearby areas of your body. Allergies to medicines or dyes. The need for a pacemaker if the normal system is damaged. Failure of the procedure to treat the problem. What happens before the procedure? Medicines Ask your doctor about: Changing or stopping your normal medicines. This is important. Taking  aspirin and ibuprofen. Do not take these medicines unless your doctor tells you to take them. Taking other medicines, vitamins, herbs, and supplements. General instructions Follow instructions from your doctor about what you cannot eat or drink. Plan to have someone take you home from the hospital or clinic. If you will be going home right after the procedure, plan to have someone with you for 24 hours. Ask your doctor what steps will be taken to prevent infection. What happens during the procedure?  An IV tube will be put into one of your veins. You will be given a medicine to help you relax. The skin on your neck or groin will be numbed. A cut (incision) will be made in your neck or groin. A needle will be put through your cut and into a large vein. A tube (catheter) will be put into the needle. The tube will be moved to your heart. Dye may be put through the tube. This helps your doctor see your heart. Small devices (electrodes) on the tube will send out signals. A type of energy will be used to  destroy some heart tissue. The tube will be taken out. Pressure will be held on your cut. This helps stop bleeding. A bandage will be put over your cut. The exact procedure may vary among doctors and hospitals. What happens after the procedure? You will be watched until you leave the hospital or clinic. This includes checking your heart rate, breathing rate, oxygen, and blood pressure. Your cut will be watched for bleeding. You will need to lie still for a few hours. Do not drive for 24 hours or as long as your doctor tells you. Summary Cardiac ablation is a procedure to destroy some heart tissue. This is done to treat heart rhythm problems. Tell your doctor about any medical conditions you may have. Tell him or her about all medicines you are taking to treat them. This is a safe procedure. But problems may occur. These include infection, bruising, bleeding, and damage to nearby areas of your  body. Follow what your doctor tells you about food and drink. You may also be told to change or stop some of your medicines. After the procedure, do not drive for 24 hours or as long as your doctor tells you. This information is not intended to replace advice given to you by your health care provider. Make sure you discuss any questions you have with your health care provider. Document Revised: 05/28/2019 Document Reviewed: 05/28/2019 Elsevier Patient Education  [place].

## 2022-02-21 ENCOUNTER — Encounter: Payer: Self-pay | Admitting: Cardiology

## 2022-02-27 ENCOUNTER — Encounter: Payer: Self-pay | Admitting: Internal Medicine

## 2022-02-28 NOTE — Progress Notes (Signed)
HPI female former smoker followed for OSA, allergic rhinitis, complicated by brain aneurysms/SAH NPSG 07/06/11- Mild OSA AHI 7.8/ hr, nonpositional, loud snoring, desat to 83%.   ----------------------------------------------------------------------------------. Marland Kitchen  02/23/21- 66 year old female former smoker (2.5 pk yrs)followed for OSA, allergic rhinitis, complicated by brain aneurysms/SAH, Covid infection March 2022,  CPAP auto 10-15/[facility] Download-compliance 97%, AHI 1.8/ hr        AirSense 10 AutoSet Body weight today-185 lbs Covid vax-3 Phizer -----No complaint's cpap working great. Doing well with CPAP. Reviewed download. Discussed communication with DME company. Covid infection March after trip to [place]- resolved. Discussed vaccine. Continues surveillance for stented brain aneurysm.  03/01/22- 66 year old female former smoker (2.5 pk yrs)followed for OSA, allergic rhinitis, complicated by brain aneurysms/SAH/stent 2013 , Covid infection March 2022, AFib, CPAP auto 10-15/[facility]      AirSense 10 AutoSet Download-compliance   100%, AHI 1.7/ hr    Body weight today-178 lbs Covid vax-3 Phizer AFib pending ablation/ [facility] Dr Quentin Ore because of her history of intracerebral bleed. Download reviewed.  Comfortable with CPAP which helps her sleep. Rhinorrhea has been more bothersome earlier this summer and she is back on Zyrtec twice daily.  Options reviewed. Asks about having something available to help intermittent insomnia.  Sleep habits reviewed.  ROS-see HPI + = positive Constitutional:   No-   weight loss, night sweats, fevers, chills, fatigue, lassitude. HEENT:   No-  headaches, difficulty swallowing, tooth/dental problems, sore throat,       Some  sneezing, itching, ear ache, nasal congestion, post nasal drip,  CV:  No-   chest pain, orthopnea, PND, swelling in lower extremities, anasarca, dizziness, palpitations Resp: No-   shortness of breath with exertion or at rest.               No-   productive cough,  No non-productive cough,  No- coughing up of blood,   change in color of mucus.  No- wheezing.   Skin: No-   rash or lesions. GI:  No-   heartburn, indigestion, abdominal pain, nausea, vomiting,  GU: MS: Normal apparent strength and muscle bulk Neuro-     nothing unusual Psych:  No- change in mood or affect. No depression or anxiety.  No memory loss.  OBJ           General- Alert, Oriented, Affect-appropriate/ friendly, Distress- none acute, tall Skin- rash-none, lesions- none, excoriation- none Lymphadenopathy- none Head- atraumatic            Eyes- Gross vision intact, PERRLA, conjunctivae clear secretions            Ears- Hearing, canals-normal            Nose- Clear, no-Septal dev, mucus, polyps, erosion, perforation             Throat- Mallampati II-III , mucosa clear , drainage- none, tonsils- atrophic Neck- flexible , trachea midline, no stridor , thyroid nl, carotid no bruit Chest - symmetrical excursion , unlabored           Heart/CV- RRR today , no murmur , no gallop  , no rub, nl s1 s2                           - JVD- none , edema- none, stasis changes- none, varices- none           Lung- clear to P&A, wheeze- none, cough- none , dullness-none, rub- none  Chest wall-  Abd- Br/ Gen/ Rectal- Not done, not indicated Extrem- cyanosis- none, clubbing, none, atrophy- none, strength- nl Neuro- grossly intact to observation

## 2022-02-28 NOTE — Telephone Encounter (Signed)
Printed and gave to front desk to scan into chart.

## 2022-03-01 ENCOUNTER — Ambulatory Visit (INDEPENDENT_AMBULATORY_CARE_PROVIDER_SITE_OTHER): Payer: Medicare Other | Admitting: Internal Medicine

## 2022-03-01 ENCOUNTER — Encounter: Payer: Self-pay | Admitting: Internal Medicine

## 2022-03-01 DIAGNOSIS — F5101 Primary insomnia: Secondary | ICD-10-CM | POA: Diagnosis not present

## 2022-03-01 DIAGNOSIS — G4733 Obstructive sleep apnea (adult) (pediatric): Secondary | ICD-10-CM

## 2022-03-01 DIAGNOSIS — I480 Paroxysmal atrial fibrillation: Secondary | ICD-10-CM | POA: Diagnosis not present

## 2022-03-01 MED ORDER — ZOLPIDEM TARTRATE 5 MG PO TABS: 5.0000 mg | ORAL_TABLET | Freq: Every evening | ORAL | 5 refills | Status: DC | PRN

## 2022-03-01 NOTE — Patient Instructions (Signed)
Script sent to refill [email or web address]  We can continue CPAP auto 10-15  Good luck with your heart work !  Please call if we can help

## 2022-03-14 ENCOUNTER — Encounter: Payer: Self-pay | Admitting: Internal Medicine

## 2022-03-14 DIAGNOSIS — I4891 Unspecified atrial fibrillation: Secondary | ICD-10-CM | POA: Insufficient documentation

## 2022-03-14 NOTE — Assessment & Plan Note (Signed)
Options discussed. Plan-Ambien with discussion

## 2022-03-14 NOTE — Assessment & Plan Note (Signed)
Benefits from CPAP and continues good compliance and control Plan-continue auto 10-15

## 2022-03-14 NOTE — Assessment & Plan Note (Signed)
Exam consistent with normal sinus rhythm at this visit.  She is working with cardiology.

## 2022-03-16 ENCOUNTER — Encounter: Payer: Self-pay | Admitting: *Deleted

## 2022-03-16 ENCOUNTER — Telehealth: Payer: Self-pay | Admitting: *Deleted

## 2022-03-16 DIAGNOSIS — I480 Paroxysmal atrial fibrillation: Secondary | ICD-10-CM

## 2022-03-16 DIAGNOSIS — Z01818 Encounter for other preprocedural examination: Secondary | ICD-10-CM

## 2022-03-16 NOTE — Telephone Encounter (Signed)
Ablation work up completed

## 2022-04-09 ENCOUNTER — Ambulatory Visit: Payer: Medicare Other | Attending: Cardiology

## 2022-04-09 DIAGNOSIS — I480 Paroxysmal atrial fibrillation: Secondary | ICD-10-CM

## 2022-04-09 DIAGNOSIS — Z01818 Encounter for other preprocedural examination: Secondary | ICD-10-CM

## 2022-04-10 LAB — CBC WITH DIFFERENTIAL/PLATELET
Basophils Absolute: 0 10*3/uL (ref 0.0–0.2)
Basos: 0 %
EOS (ABSOLUTE): 0 10*3/uL (ref 0.0–0.4)
Eos: 0 %
Hematocrit: 38.3 % (ref 34.0–46.6)
Hemoglobin: 13.2 g/dL (ref 11.1–15.9)
Immature Grans (Abs): 0 10*3/uL (ref 0.0–0.1)
Immature Granulocytes: 0 %
Lymphocytes Absolute: 3 10*3/uL (ref 0.7–3.1)
Lymphs: 42 %
MCH: 33.5 pg — ABNORMAL HIGH (ref 26.6–33.0)
MCHC: 34.5 g/dL (ref 31.5–35.7)
MCV: 97 fL (ref 79–97)
Monocytes Absolute: 0.5 10*3/uL (ref 0.1–0.9)
Monocytes: 6 %
Neutrophils Absolute: 3.7 10*3/uL (ref 1.4–7.0)
Neutrophils: 52 %
Platelets: 226 10*3/uL (ref 150–450)
RBC: 3.94 x10E6/uL (ref 3.77–5.28)
RDW: 12.5 % (ref 11.7–15.4)
WBC: 7.2 10*3/uL (ref 3.4–10.8)

## 2022-04-10 LAB — BASIC METABOLIC PANEL
BUN/Creatinine Ratio: 18 (ref 12–28)
BUN: 15 mg/dL (ref 8–27)
CO2: 24 mmol/L (ref 20–29)
Calcium: 9.8 mg/dL (ref 8.7–10.3)
Chloride: 100 mmol/L (ref 96–106)
Creatinine, Ser: 0.85 mg/dL (ref 0.57–1.00)
Glucose: 92 mg/dL (ref 70–99)
Potassium: 3.7 mmol/L (ref 3.5–5.2)
Sodium: 140 mmol/L (ref 134–144)
eGFR: 76 mL/min/{1.73_m2} (ref >59)

## 2022-04-23 ENCOUNTER — Ambulatory Visit (HOSPITAL_BASED_OUTPATIENT_CLINIC_OR_DEPARTMENT_OTHER): Admission: RE | Admit: 2022-04-23 | Payer: Medicare Other | Source: Ambulatory Visit

## 2022-04-23 ENCOUNTER — Telehealth (HOSPITAL_COMMUNITY): Payer: Self-pay | Admitting: *Deleted

## 2022-04-23 NOTE — Telephone Encounter (Signed)
Patient returning call regarding upcoming cardiac imaging study; pt verbalizes understanding of appt date/time, parking situation and where to check in, and verified current allergies; name and call back number provided for further questions should they arise  Shannon Clement RN Navigator Cardiac Imaging [facility] [phone] office [phone] cell  Patient to take her daily medications. She is aware to arrive at 1pm.

## 2022-04-23 NOTE — Telephone Encounter (Signed)
Attempted to call patient regarding upcoming cardiac CT appointment. °Left message on voicemail with name and callback number ° °Kalyb Pemble RN Navigator Cardiac Imaging °[facility] °[phone] Office °[phone] Cell ° °

## 2022-04-25 ENCOUNTER — Ambulatory Visit (HOSPITAL_COMMUNITY)
Admission: RE | Admit: 2022-04-25 | Discharge: 2022-04-25 | Disposition: A | Discharge status: Home / Self Care | Payer: Medicare Other | Source: Ambulatory Visit | Attending: Cardiology | Admitting: Cardiology

## 2022-04-25 DIAGNOSIS — I480 Paroxysmal atrial fibrillation: Secondary | ICD-10-CM | POA: Insufficient documentation

## 2022-04-25 DIAGNOSIS — Z01818 Encounter for other preprocedural examination: Secondary | ICD-10-CM | POA: Diagnosis present

## 2022-04-25 MED ORDER — IOHEXOL 350 MG/ML SOLN
95.0000 mL | Freq: Once | INTRAVENOUS | Status: AC | PRN
  Administered 2022-04-25: 95 mL via INTRAVENOUS

## 2022-04-30 ENCOUNTER — Encounter (HOSPITAL_COMMUNITY)
Admission: RE | Disposition: A | Discharge status: Home / Self Care | Payer: Self-pay | Source: Home / Self Care | Attending: Cardiology

## 2022-04-30 ENCOUNTER — Ambulatory Visit (HOSPITAL_COMMUNITY): Payer: Medicare Other | Admitting: Certified Registered Nurse Anesthetist

## 2022-04-30 ENCOUNTER — Ambulatory Visit (HOSPITAL_COMMUNITY)
Admission: RE | Admit: 2022-04-30 | Discharge: 2022-04-30 | Disposition: A | Discharge status: Home / Self Care | Payer: Medicare Other | Attending: Cardiology | Admitting: Cardiology

## 2022-04-30 ENCOUNTER — Other Ambulatory Visit (HOSPITAL_BASED_OUTPATIENT_CLINIC_OR_DEPARTMENT_OTHER): Payer: Medicare Other

## 2022-04-30 ENCOUNTER — Encounter (HOSPITAL_COMMUNITY): Payer: Self-pay | Admitting: Cardiology

## 2022-04-30 ENCOUNTER — Ambulatory Visit (HOSPITAL_BASED_OUTPATIENT_CLINIC_OR_DEPARTMENT_OTHER): Payer: Medicare Other | Admitting: Certified Registered Nurse Anesthetist

## 2022-04-30 ENCOUNTER — Other Ambulatory Visit: Payer: Self-pay

## 2022-04-30 ENCOUNTER — Other Ambulatory Visit (HOSPITAL_COMMUNITY): Payer: Self-pay

## 2022-04-30 DIAGNOSIS — I609 Nontraumatic subarachnoid hemorrhage, unspecified: Secondary | ICD-10-CM | POA: Insufficient documentation

## 2022-04-30 DIAGNOSIS — I10 Essential (primary) hypertension: Secondary | ICD-10-CM | POA: Diagnosis not present

## 2022-04-30 DIAGNOSIS — G4730 Sleep apnea, unspecified: Secondary | ICD-10-CM

## 2022-04-30 DIAGNOSIS — I480 Paroxysmal atrial fibrillation: Secondary | ICD-10-CM | POA: Diagnosis present

## 2022-04-30 DIAGNOSIS — G4733 Obstructive sleep apnea (adult) (pediatric): Secondary | ICD-10-CM | POA: Insufficient documentation

## 2022-04-30 DIAGNOSIS — I4891 Unspecified atrial fibrillation: Secondary | ICD-10-CM | POA: Diagnosis not present

## 2022-04-30 DIAGNOSIS — Z7901 Long term (current) use of anticoagulants: Secondary | ICD-10-CM | POA: Insufficient documentation

## 2022-04-30 DIAGNOSIS — Z87891 Personal history of nicotine dependence: Secondary | ICD-10-CM

## 2022-04-30 HISTORY — PX: ATRIAL FIBRILLATION ABLATION: EP1191

## 2022-04-30 LAB — POCT ACTIVATED CLOTTING TIME
Activated Clotting Time: 293 seconds
Activated Clotting Time: 317 seconds

## 2022-04-30 SURGERY — ATRIAL FIBRILLATION ABLATION
Anesthesia: General

## 2022-04-30 MED ORDER — OXYCODONE HCL 5 MG/5ML PO SOLN: 5.0000 mg | Freq: Once | ORAL | Status: DC | PRN

## 2022-04-30 MED ORDER — ACETAMINOPHEN 325 MG PO TABS
650.0000 mg | ORAL_TABLET | ORAL | Status: DC | PRN
  Administered 2022-04-30: 650 mg via ORAL
  Filled 2022-04-30: qty 2

## 2022-04-30 MED ORDER — FENTANYL CITRATE (PF) 100 MCG/2ML IJ SOLN: 25.0000 ug | INTRAMUSCULAR | Status: DC | PRN

## 2022-04-30 MED ORDER — PHENYLEPHRINE HCL-NACL 20-0.9 MG/250ML-% IV SOLN
INTRAVENOUS | Status: DC | PRN
  Administered 2022-04-30: 25 ug/min via INTRAVENOUS

## 2022-04-30 MED ORDER — APIXABAN 5 MG PO TABS
5.0000 mg | ORAL_TABLET | Freq: Two times a day (BID) | ORAL | Status: DC
  Administered 2022-04-30: 5 mg via ORAL
  Filled 2022-04-30: qty 1

## 2022-04-30 MED ORDER — SUGAMMADEX SODIUM 200 MG/2ML IV SOLN
INTRAVENOUS | Status: DC | PRN
  Administered 2022-04-30: 200 mg via INTRAVENOUS

## 2022-04-30 MED ORDER — SODIUM CHLORIDE 0.9% FLUSH: 3.0000 mL | INTRAVENOUS | Status: DC | PRN

## 2022-04-30 MED ORDER — ROCURONIUM BROMIDE 10 MG/ML (PF) SYRINGE
PREFILLED_SYRINGE | INTRAVENOUS | Status: DC | PRN
  Administered 2022-04-30: 70 mg via INTRAVENOUS

## 2022-04-30 MED ORDER — HEPARIN SODIUM (PORCINE) 1000 UNIT/ML IJ SOLN
INTRAMUSCULAR | Status: AC
  Filled 2022-04-30: qty 10

## 2022-04-30 MED ORDER — PROTAMINE SULFATE 10 MG/ML IV SOLN
INTRAVENOUS | Status: DC | PRN
  Administered 2022-04-30: 10 mg via INTRAVENOUS
  Administered 2022-04-30: 15 mg via INTRAVENOUS
  Administered 2022-04-30: 10 mg via INTRAVENOUS

## 2022-04-30 MED ORDER — COLCHICINE 0.6 MG PO TABS
0.6000 mg | ORAL_TABLET | Freq: Two times a day (BID) | ORAL | 0 refills | Status: DC
  Filled 2022-04-30: qty 10, 5d supply, fill #0

## 2022-04-30 MED ORDER — COLCHICINE 0.6 MG PO TABS
0.6000 mg | ORAL_TABLET | Freq: Two times a day (BID) | ORAL | Status: DC
  Administered 2022-04-30: 0.6 mg via ORAL
  Filled 2022-04-30: qty 1

## 2022-04-30 MED ORDER — PROPOFOL 10 MG/ML IV BOLUS
INTRAVENOUS | Status: DC | PRN
  Administered 2022-04-30: 150 mg via INTRAVENOUS
  Administered 2022-04-30: 50 mg via INTRAVENOUS

## 2022-04-30 MED ORDER — HEPARIN (PORCINE) IN NACL 1000-0.9 UT/500ML-% IV SOLN
INTRAVENOUS | Status: AC
  Filled 2022-04-30: qty 500

## 2022-04-30 MED ORDER — MIDAZOLAM HCL 2 MG/2ML IJ SOLN
INTRAMUSCULAR | Status: DC | PRN
  Administered 2022-04-30 (×2): 1 mg via INTRAVENOUS

## 2022-04-30 MED ORDER — SODIUM CHLORIDE 0.9 % IV SOLN: 250.0000 mL | INTRAVENOUS | Status: DC | PRN

## 2022-04-30 MED ORDER — FENTANYL CITRATE (PF) 250 MCG/5ML IJ SOLN
INTRAMUSCULAR | Status: DC | PRN
  Administered 2022-04-30 (×2): 50 ug via INTRAVENOUS

## 2022-04-30 MED ORDER — BSS IO SOLN
15.0000 mL | Freq: Once | INTRAOCULAR | Status: AC
  Administered 2022-04-30: 15 mL
  Filled 2022-04-30: qty 15

## 2022-04-30 MED ORDER — ONDANSETRON HCL 4 MG/2ML IJ SOLN: 4.0000 mg | Freq: Once | INTRAMUSCULAR | Status: DC | PRN

## 2022-04-30 MED ORDER — SODIUM CHLORIDE 0.9% FLUSH
3.0000 mL | Freq: Two times a day (BID) | INTRAVENOUS | Status: DC
  Administered 2022-04-30: 3 mL via INTRAVENOUS

## 2022-04-30 MED ORDER — HEPARIN SODIUM (PORCINE) 1000 UNIT/ML IJ SOLN
INTRAMUSCULAR | Status: DC | PRN
  Administered 2022-04-30: 1000 [IU] via INTRAVENOUS

## 2022-04-30 MED ORDER — SODIUM CHLORIDE 0.9 % IV SOLN: INTRAVENOUS | Status: DC

## 2022-04-30 MED ORDER — KETOROLAC TROMETHAMINE 0.5 % OP SOLN
1.0000 [drp] | Freq: Four times a day (QID) | OPHTHALMIC | Status: DC
  Filled 2022-04-30 (×2): qty 5

## 2022-04-30 MED ORDER — ONDANSETRON HCL 4 MG/2ML IJ SOLN
INTRAMUSCULAR | Status: DC | PRN
  Administered 2022-04-30: 4 mg via INTRAVENOUS

## 2022-04-30 MED ORDER — HEPARIN (PORCINE) IN NACL 1000-0.9 UT/500ML-% IV SOLN
INTRAVENOUS | Status: DC | PRN
  Administered 2022-04-30 (×3): 500 mL

## 2022-04-30 MED ORDER — ONDANSETRON HCL 4 MG/2ML IJ SOLN: 4.0000 mg | Freq: Four times a day (QID) | INTRAMUSCULAR | Status: DC | PRN

## 2022-04-30 MED ORDER — POLYMYXIN B-TRIMETHOPRIM 10000-0.1 UNIT/ML-% OP SOLN
1.0000 [drp] | Freq: Four times a day (QID) | OPHTHALMIC | Status: DC
  Filled 2022-04-30 (×2): qty 10

## 2022-04-30 MED ORDER — OXYCODONE HCL 5 MG PO TABS: 5.0000 mg | ORAL_TABLET | Freq: Once | ORAL | Status: DC | PRN

## 2022-04-30 MED ORDER — PANTOPRAZOLE SODIUM 40 MG PO TBEC
40.0000 mg | DELAYED_RELEASE_TABLET | Freq: Every day | ORAL | 0 refills | Status: DC
  Filled 2022-04-30: qty 45, 45d supply, fill #0

## 2022-04-30 MED ORDER — HEPARIN SODIUM (PORCINE) 1000 UNIT/ML IJ SOLN
INTRAMUSCULAR | Status: DC | PRN
  Administered 2022-04-30: 13000 [IU] via INTRAVENOUS
  Administered 2022-04-30: 2000 [IU] via INTRAVENOUS
  Administered 2022-04-30: 4000 [IU] via INTRAVENOUS

## 2022-04-30 MED ORDER — DEXAMETHASONE SODIUM PHOSPHATE 10 MG/ML IJ SOLN
INTRAMUSCULAR | Status: DC | PRN
  Administered 2022-04-30: 10 mg via INTRAVENOUS

## 2022-04-30 MED ORDER — PANTOPRAZOLE SODIUM 40 MG PO TBEC
40.0000 mg | DELAYED_RELEASE_TABLET | Freq: Every day | ORAL | Status: DC
  Administered 2022-04-30: 40 mg via ORAL
  Filled 2022-04-30: qty 1

## 2022-04-30 MED ORDER — LIDOCAINE 2% (20 MG/ML) 5 ML SYRINGE
INTRAMUSCULAR | Status: DC | PRN
  Administered 2022-04-30: 100 mg via INTRAVENOUS

## 2022-04-30 SURGICAL SUPPLY — 19 items
BAG SNAP BAND KOVER 36X36 (MISCELLANEOUS) IMPLANT
CATH 8FR REPROCESSED SOUNDSTAR (CATHETERS) ×1 IMPLANT
CATH 8FR SOUNDSTAR REPROCESSED (CATHETERS) IMPLANT
CATH ABLAT QDOT MICRO BI TC DF (CATHETERS) IMPLANT
CATH OCTARAY 2.0 F 3-3-3-3-3 (CATHETERS) IMPLANT
CATH S-M CIRCA TEMP PROBE (CATHETERS) IMPLANT
CATH WEBSTER BI DIR CS D-F CRV (CATHETERS) IMPLANT
CLOSURE PERCLOSE PROSTYLE (VASCULAR PRODUCTS) IMPLANT
COVER SWIFTLINK CONNECTOR (BAG) ×1 IMPLANT
PACK EP LATEX FREE (CUSTOM PROCEDURE TRAY) ×1
PACK EP LF (CUSTOM PROCEDURE TRAY) ×1 IMPLANT
PAD DEFIB RADIO PHYSIO CONN (PAD) ×1 IMPLANT
PATCH CARTO3 (PAD) IMPLANT
SHEATH BAYLIS TRANSSEPTAL 98CM (NEEDLE) IMPLANT
SHEATH CARTO VIZIGO SM CVD (SHEATH) IMPLANT
SHEATH PINNACLE 8F 10CM (SHEATH) IMPLANT
SHEATH PINNACLE 9F 10CM (SHEATH) IMPLANT
SHEATH PROBE COVER 6X72 (BAG) IMPLANT
TUBING SMART ABLATE COOLFLOW (TUBING) IMPLANT

## 2022-04-30 NOTE — Transfer of Care (Signed)
Immediate Anesthesia Transfer of Care Note  Patient: Shannon Yu  Procedure(s) Performed: ATRIAL FIBRILLATION ABLATION  Patient Location: Cath Lab  Anesthesia Type:General  Level of Consciousness: awake, alert , and oriented  Airway & Oxygen Therapy: Patient Spontanous Breathing  Post-op Assessment: Report given to RN and Post -op Vital signs reviewed and stable  Post vital signs: Reviewed and stable  Last Vitals:  Vitals Value Taken Time  BP 112/66 04/30/22 0942  Temp    Pulse 88 04/30/22 0943  Resp 17 04/30/22 0943  SpO2 91 % 04/30/22 0943  Vitals shown include unvalidated device data.  Last Pain:  Vitals:   04/30/22 0543  TempSrc:   PainSc: 0-No pain         Complications: There were no known notable events for this encounter.

## 2022-04-30 NOTE — Progress Notes (Signed)
Dr. Kalman Shan contacted to come see pts eye via [place], [place]

## 2022-04-30 NOTE — Anesthesia Procedure Notes (Signed)
Procedure Name: Intubation Date/Time: 04/30/2022 7:46 AM  Performed by: Carolan Clines, CRNAPre-anesthesia Checklist: Patient identified, Emergency Drugs available, Suction available and Patient being monitored Patient Re-evaluated:Patient Re-evaluated prior to induction Oxygen Delivery Method: Circle System Utilized Preoxygenation: Pre-oxygenation with 100% oxygen Induction Type: IV induction Ventilation: Mask ventilation without difficulty and Oral airway inserted - appropriate to patient size Laryngoscope Size: Mac and 3 Grade View: Grade II Tube type: Oral Tube size: 7.0 mm Number of attempts: 1 Airway Equipment and Method: Stylet and Oral airway Placement Confirmation: ETT inserted through vocal cords under direct vision, positive ETCO2 and breath sounds checked- equal and bilateral Secured at: 22 cm Tube secured with: Tape Dental Injury: Teeth and Oropharynx as per pre-operative assessment

## 2022-04-30 NOTE — Anesthesia Postprocedure Evaluation (Signed)
Anesthesia Post Note  Patient: Shannon Yu  Procedure(s) Performed: ATRIAL FIBRILLATION ABLATION     Patient location during evaluation: PACU Anesthesia Type: General Level of consciousness: awake and alert Pain management: pain level controlled Vital Signs Assessment: post-procedure vital signs reviewed and stable Respiratory status: spontaneous breathing, nonlabored ventilation, respiratory function stable and patient connected to nasal cannula oxygen Cardiovascular status: blood pressure returned to baseline and stable Postop Assessment: no apparent nausea or vomiting Anesthetic complications: no   There were no known notable events for this encounter.  Last Vitals:  Vitals:   04/30/22 1012 04/30/22 1019  BP: 113/67 111/70  Pulse: 79 81  Resp: 15 14  Temp:    SpO2: 95% 90%    Last Pain:  Vitals:   04/30/22 1025  TempSrc:   PainSc: 0-No pain                 Dabria Wadas S

## 2022-04-30 NOTE — Anesthesia Preprocedure Evaluation (Signed)
Anesthesia Evaluation  Patient identified by MRN, date of birth, ID band Patient awake    Reviewed: Allergy & Precautions, NPO status , Patient's Chart, lab work & pertinent test results  Airway Mallampati: II  TM Distance: <3 FB Neck ROM: Full    Dental no notable dental hx.    Pulmonary sleep apnea , former smoker,    Pulmonary exam normal breath sounds clear to auscultation       Cardiovascular hypertension, Normal cardiovascular exam+ dysrhythmias Atrial Fibrillation  Rhythm:Regular Rate:Normal     Neuro/Psych CVA negative psych ROS   GI/Hepatic Neg liver ROS, GERD  ,  Endo/Other  negative endocrine ROS  Renal/GU negative Renal ROS  negative genitourinary   Musculoskeletal negative musculoskeletal ROS (+)   Abdominal   Peds negative pediatric ROS (+)  Hematology negative hematology ROS (+)   Anesthesia Other Findings   Reproductive/Obstetrics negative OB ROS                             Anesthesia Physical Anesthesia Plan  ASA: 3  Anesthesia Plan: General   Post-op Pain Management: Minimal or no pain anticipated   Induction: Intravenous  PONV Risk Score and Plan: 3 and Ondansetron, Dexamethasone, Midazolam and Treatment may vary due to age or medical condition  Airway Management Planned: Oral ETT  Additional Equipment:   Intra-op Plan:   Post-operative Plan: Extubation in OR  Informed Consent: I have reviewed the patients History and Physical, chart, labs and discussed the procedure including the risks, benefits and alternatives for the proposed anesthesia with the patient or authorized representative who has indicated his/her understanding and acceptance.     Dental advisory given  Plan Discussed with: CRNA and Surgeon  Anesthesia Plan Comments:         Anesthesia Quick Evaluation

## 2022-04-30 NOTE — Discharge Instructions (Signed)
Post procedure care instructions No driving for 4 days. No lifting over 5 lbs for 1 week. No vigorous or sexual activity for 1 week. You may return to work/your usual activities on 05/08/22. Keep procedure site clean & dry. If you notice increased pain, swelling, bleeding or pus, call/return!  You may shower after 24 hours, but no soaking in baths/hot tubs/pools for 1 week.   You have an appointment set up with the [facility].  Multiple studies have shown that being followed by a dedicated atrial fibrillation clinic in addition to the standard care you receive from your other physicians improves health. We believe that enrollment in the atrial fibrillation clinic will allow [place] to better care for you.   The phone number to the [facility] is [phone]. The clinic is staffed Monday through Friday from 8:30am to 5pm.  Directions: The clinic is located in the [facility], [place] the hospital at the MAIN ENTRANCE "A", use [address] to the 6th floor.  Registration desk to the right of elevators on 6th floor  If you have any trouble locating the clinic, please don't hesitate to call [phone].

## 2022-04-30 NOTE — H&P (Signed)
Electrophysiology Office Note:     Date:  04/30/2022    ID:  Shannon, Yu 07/13/1955, MRN [id number]   PCP:  Lilian Coma., MD  [facility] Cardiologist:  Donato Heinz, MD  [facility] Electrophysiologist:  Vickie Epley, MD    Referring MD: Donato Heinz*    Chief Complaint: New patient consult for Afib ablation   History of Present Illness:     Shannon Yu is a 66 y.o. female who presents for an evaluation for Afib ablation at the request of Dr. Gardiner Rhyme. Their medical history includes [place] 2013, brain aneurysm s/p stent 2014 and further stenting 2015, stroke, hypertension, cervical cancer, shingles, and sleep apnea on CPAP.   During a procedure for schwannoma removal 07/20/2021 she was told that she had atrial fibrillation with rates up to 130s.   She saw Dr. Gardiner Rhyme on 11/16/2021. Zio patch x14 days on 09/18/2021 showed 3% atrial fibrillation burden, average rate 127 bpm with longest episode lasting 34 minutes. She was continued on metoprolol 25 mg BID for rate control. Discussed with Dr. Estanislado Pandy about anticoagulation given her history of SAH/brain aneurysm, in agreement that she is okay to start anticoagulation. She was started on Eliquis 5 mg twice daily and ASA was stopped. She was also interested in pursuing ablation, so she was referred to EP for further evaluation.   Today, she is accompanied by a family member. She notes she had been on 325 mg ASA prior to Eliquis. At this time she has been on Eliquis for 3-4 weeks.   She reports having 3 episodes of atrial fibrillation. Once was during a vacation and she attributed her arrhythmia to the heat. Most recently she had an episode last night that felt like "flopping", and made her feel nervous. Usually her heart rate feels fast as well. This lasted for less than an hour.   For activity she enjoys playing tennis.    During her previous SAH, she had initially experienced tunnel vision, and "lit  up triangles" in her field of vision. She returns every 2 years for a brain MRI/MRA (last 08/2021).   They deny any chest pain, shortness of breath, or peripheral edema. No lightheadedness, headaches, syncope, orthopnea, or PND.   Presents today for PVI.      Objective      Past Medical History:  Diagnosis Date   Brain aneurysm 05/09/12    coil/stent, angio with stent 02/2014   Cervical cancer ([place])     Diverticulosis     Environmental allergies     History of shingles     HTN (hypertension)     Menopause     Migraine     Osteoarthritis      left knee   Personal history of colonic polyps      noted on 2012 colonoscopy   Sleep apnea 06/2011 dx    CPAP 13/apria   Stroke (cerebrum) ([place])     Vertigo             Past Surgical History:  Procedure Laterality Date   ANEURYSM COILING   05/09/2012    middle   Brain stent   2013, 2015   BREAST BIOPSY Right 1988    BENIGN   BREAST BIOPSY Left 1993    BENIGN   COLONOSCOPY   12/13/2020   IR RADIOLOGIST EVAL & MGMT   09/13/2021   KNEE ARTHROSCOPY Left     RADIOLOGY WITH ANESTHESIA N/A 09/03/2012  Procedure: RADIOLOGY WITH ANESTHESIA;  Surgeon: Rob Hickman, MD;  Location: [place];  Service: Radiology;  Laterality: N/A;  aneurysm embolization    RADIOLOGY WITH ANESTHESIA N/A 02/10/2014    Procedure: RADIOLOGY WITH ANESTHESIA, emobolization;  Surgeon: Rob Hickman, MD;  Location: [place];  Service: Radiology;  Laterality: N/A;      Current Medications: Active Medications      Current Meds  Medication Sig   apixaban (ELIQUIS) 5 MG TABS tablet Take 1 tablet (5 mg total) by mouth 2 (two) times daily.   cetirizine (ZYRTEC) 10 MG tablet Take 10 mg by mouth daily.   Cholecalciferol 125 MCG (5000 UT) TABS Take by mouth.   hydrochlorothiazide (HYDRODIURIL) 12.5 MG tablet TAKE 1 TABLET DAILY. OFFICE VISIT REQUIRED PRIOR TO ANY FURTHER REFILLS   metoprolol tartrate (LOPRESSOR) 25 MG tablet TAKE 1 TABLET(25 MG) BY MOUTH TWICE DAILY    Omega-3 Fatty Acids (FISH OIL PO) Take 1 capsule by mouth daily.   omeprazole (PRILOSEC) 40 MG capsule TAKE 1 CAPSULE(40 MG) BY MOUTH DAILY**NEEDS APT FOR FURTHER REFILLS**   VITAMIN D, CHOLECALCIFEROL, PO Take 25 mcg by mouth daily.   zolpidem (AMBIEN) 5 MG tablet Take 1 tablet (5 mg total) by mouth at bedtime as needed for sleep.        Allergies:   Patient has no known allergies.    Social History         Socioeconomic History   Marital status: Significant Other      Spouse name: Not on file   Number of children: 1   Years of education: College degree   Highest education level: Not on file  Occupational History   Occupation: PERSONNEL      Employer: [employer]  Tobacco Use   Smoking status: Former      Packs/day: 0.25      Years: 10.00      Total pack years: 2.50      Types: Cigarettes      Start date: 57      Quit date: 07/10/2011      Years since quitting: 10.4   Smokeless tobacco: Never  Vaping Use   Vaping Use: Never used  Substance and Sexual Activity   Alcohol use: Yes      Comment: 1 glass of wine ocassionally   Drug use: No   Sexual activity: Not Currently  Other Topics Concern   Not on file  Social History Narrative    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 There is no history of Colon polyps, Colon cancer, Esophageal cancer, Stomach cancer, or Rectal cancer.   ROS:   Please see the history of present illness.     (+) "Flopping" palpitations (+) Anxious feelings All other systems reviewed and are negative.   EKGs/Labs/Other Studies Reviewed:     The following studies were reviewed today:   09/2021  Monitor:  3% atrial fibrillation burden, average rate 127 bpm. Longest episode lasted 34 minutes with average rate 119 bpm    Patch Wear Time:  13 days and 16 hours (2023-02-19T15:44:59-0500 to 2023-03-05T08:17:38-0500)   Patient had a min HR of 48 bpm, max HR of 186 bpm, and avg HR of 76 bpm. Predominant underlying rhythm was Sinus Rhythm. Atrial Fibrillation occurred (3% burden), ranging from 90-186 bpm (avg of 127 bpm), the longest lasting 33 mins 46 secs with an avg  rate of 119 bpm. Isolated SVEs were rare (<1.0%), SVE Couplets were rare (<1.0%), and SVE Triplets were rare (<1.0%). Isolated VEs were rare (<1.0%), and no VE Couplets or VE Triplets were present.  1 patient triggered event, corresponding to sinus rhythm   09/18/2021  Echocardiogram:  1. Left ventricular ejection fraction, by estimation, is 65 to 70%. The  left ventricle has normal function. The left ventricle has no regional  wall motion abnormalities. Left ventricular diastolic parameters are  consistent with Grade I diastolic  dysfunction (impaired relaxation).   2. Right ventricular systolic function is low normal. The right  ventricular size is normal.   3. The mitral valve is abnormal. Trivial mitral valve regurgitation.   4. The aortic valve is tricuspid. Aortic valve regurgitation is not  visualized.   Comparison(s): No prior Echocardiogram.      EKG:   EKG is personally reviewed.  01/04/2022:  EKG was not ordered.       Recent Labs: No results found for requested labs within last 365 days.    Recent Lipid Panel Labs (Brief)          Component Value Date/Time    CHOL 211 (H) 12/08/2019 0842    TRIG 71 12/08/2019 0842    HDL 64 12/08/2019 0842    CHOLHDL 3.3 12/08/2019 0842    CHOLHDL 4 06/09/2014 0818    VLDL  11.0 06/09/2014 0818    LDLCALC 134 (H) 12/08/2019 0842        Physical Exam:     VS:  BP 134/75   Pulse 61   Ht '5\' 7"'$  (1.702 m)   Wt 178 lb (80.7 kg)   SpO2 96%   BMI 27.88 kg/m         Wt Readings from Last 3 Encounters:  01/04/22 178 lb (80.7 kg)  11/16/21 178 lb 12.8 oz (81.1 kg)  10/06/21 182 lb 6.4 oz (82.7 kg)      GEN: Well nourished, well developed in no acute distress HEENT: Normal NECK: No JVD; No carotid bruits LYMPHATICS: No lymphadenopathy CARDIAC: RRR, no murmurs, rubs, gallops RESPIRATORY:  Clear to auscultation without rales, wheezing or rhonchi  ABDOMEN: Soft, non-tender, non-distended MUSCULOSKELETAL:  No edema; No deformity  SKIN: Warm and dry NEUROLOGIC:  Alert and oriented x 3 PSYCHIATRIC:  Normal affect          Assessment ASSESSMENT:     1. Paroxysmal atrial fibrillation (HCC)   2. Essential hypertension   3. SAH (subarachnoid hemorrhage) ([place])   4. OSA on CPAP     PLAN:     In order of problems listed above:   #Paroxysmal atrial fibrillation Symptomatic salvos of atrial fibrillation thankfully still fairly low burden.  We discussed the pathophysiology of atrial fibrillation and the associated stroke risk during today's clinic appointment.  She is currently taking Eliquis for stroke prophylaxis and is tolerating this well but desires a long-term stroke mitigation strategy that avoids long-term exposure anticoagulation.   Plan for PVI.  Discussed treatment options today for her AF including antiarrhythmic drug therapy and ablation. Discussed risks, recovery and likelihood of success. Discussed potential need for repeat ablation procedures and antiarrhythmic drugs after an initial ablation. They wish to proceed with scheduling.   Risk, benefits, and alternatives to EP study and radiofrequency ablation for afib were also discussed in detail today. These risks include but are not limited to stroke, bleeding, vascular damage, tamponade,  perforation, damage to the esophagus, lungs, and other structures, pulmonary vein stenosis, worsening renal function, and death. The patient understands these risk and wishes to proceed.  We will therefore proceed with catheter ablation at the next available time.  Carto, ICE, anesthesia are requested for the procedure.  Will also obtain CT PV protocol prior to the procedure to exclude LAA thrombus and further evaluate atrial anatomy.   Presents today for PVI. Procedure reviewed.       Signed, Hilton Cork. Quentin Ore, MD, [facility], [facility] 04/30/2022 Electrophysiology [facility]

## 2022-04-30 NOTE — Progress Notes (Signed)
Post procedure ambulation done, no s/s of bleeding or hematoma noted bilat femoral

## 2022-04-30 NOTE — Progress Notes (Signed)
Writer spoke to Dr. Kalman Shan in Anesthesia and MD instructed writer to inform patient to take eye drops 4 times total in 24 hours/ever 6 hours. Verbal orders given to writer were written and given to patient and husband, verbalized understanding.

## 2022-05-01 ENCOUNTER — Encounter (HOSPITAL_COMMUNITY): Payer: Self-pay | Admitting: Cardiology

## 2022-05-10 ENCOUNTER — Encounter: Payer: Self-pay | Admitting: Cardiology

## 2022-05-14 ENCOUNTER — Other Ambulatory Visit: Payer: Self-pay

## 2022-05-14 DIAGNOSIS — I480 Paroxysmal atrial fibrillation: Secondary | ICD-10-CM

## 2022-05-14 DIAGNOSIS — I609 Nontraumatic subarachnoid hemorrhage, unspecified: Secondary | ICD-10-CM

## 2022-05-22 NOTE — Progress Notes (Unsigned)
Cardiology Office Note:    Date:  11/16/2021   ID:  Shannon Yu 1956-04-24, MRN [id number]  PCP:  Lilian Coma., MD  Cardiologist:  Donato Heinz, MD  Electrophysiologist:  None   Referring MD: Lilian Coma., MD   Chief Complaint  Patient presents with   Atrial Fibrillation    History of Present Illness:    Shannon Yu is a 66 y.o. female with a hx of brain aneurysm, SAH, cervical cancer, OSA who presents for follow-up.  She was referred by Dr. Valora Piccolo for evaluation of atrial fibrillation, initially seen on 08/21/2021.  She recently underwent removal of a mass on her arm and during the procedure was found to be in atrial fibrillation.  She does have a history of a subarachnoid hemorrhage.  She was admitted in November 2013 with subarachnoid hemorrhage.  CT a showed aneurysm and she was treated with coil embolization.  She was found on angiogram in 2014 to have remnant of aneurysm and underwent additional coiling and stent placement 08/2012.  Required further stenting 02/2014.  She reports that she had schwannoma removal and was told she had A-fib during the procedure.  She is not sure how long the A-fib lasted.  She reports rare palpitations where feels like heart is racing, occurs few times per year and lasts minutes.  Denies any lightheadedness, syncope, or chest pain.  Does report some dyspnea.  No lower extremity edema.  Reports BP usually well controlled but has not been checking recently.  Smoked for 15 years about 1 pack/day, quit in 2013.  Family history includes mother had Wolff-Parkinson-White syndrome.  Echocardiogram on 09/18/2021 showed normal biventricular function, no significant valvular disease.  Zio patch x14 days on 09/18/2021 showed 3% atrial fibrillation burden, average rate 127 bpm with longest episode lasting 34 minutes.  She was referred to Dr. Quentin Ore in the EP and underwent A-fib ablation on 04/30/2022.  Since last clinic visit, she reports that she is  doing well.  Denies any chest pain, dyspnea,  lower extremity edema, or palpitations.  Reports some lightheadedness but denies any syncope.  Occasional palpitations.     Past Medical History:  Diagnosis Date   Brain aneurysm 05/09/12   coil/stent, angio with stent 02/2014   Cervical cancer ([place])    Diverticulosis    Environmental allergies    History of shingles    HTN (hypertension)    Menopause    Migraine    Osteoarthritis    left knee   Personal history of colonic polyps    noted on 2012 colonoscopy   Sleep apnea 06/2011 dx   CPAP 13/apria   Stroke (cerebrum) (HCC)    Vertigo     Past Surgical History:  Procedure Laterality Date   ANEURYSM COILING  05/09/2012   middle   Brain stent  2013, 2015   BREAST BIOPSY Right 1988   BENIGN   BREAST BIOPSY Left 1993   BENIGN   COLONOSCOPY  12/13/2020   IR RADIOLOGIST EVAL & MGMT  09/13/2021   KNEE ARTHROSCOPY Left    RADIOLOGY WITH ANESTHESIA N/A 09/03/2012   Procedure: RADIOLOGY WITH ANESTHESIA;  Surgeon: Rob Hickman, MD;  Location: [place];  Service: Radiology;  Laterality: N/A;  aneurysm embolization    RADIOLOGY WITH ANESTHESIA N/A 02/10/2014   Procedure: RADIOLOGY WITH ANESTHESIA, emobolization;  Surgeon: Rob Hickman, MD;  Location: [place];  Service: Radiology;  Laterality: N/A;    Current Medications: Current Meds  Medication Sig   apixaban (ELIQUIS) 5 MG TABS tablet Take 1 tablet (5 mg total) by mouth 2 (two) times daily.   cetirizine (ZYRTEC) 10 MG tablet Take 10 mg by mouth daily.   Cholecalciferol 125 MCG (5000 UT) TABS Take by mouth.   hydrochlorothiazide (HYDRODIURIL) 12.5 MG tablet TAKE 1 TABLET DAILY. OFFICE VISIT REQUIRED PRIOR TO ANY FURTHER REFILLS   metoprolol tartrate (LOPRESSOR) 25 MG tablet Take 1 tablet (25 mg total) by mouth 2 (two) times daily.   Omega-3 Fatty Acids (FISH OIL PO) Take 1 capsule by mouth daily.   omeprazole (PRILOSEC) 40 MG capsule TAKE 1 CAPSULE(40 MG) BY MOUTH DAILY**NEEDS  APT FOR FURTHER REFILLS**   VITAMIN D, CHOLECALCIFEROL, PO Take 25 mcg by mouth daily.   zolpidem (AMBIEN) 5 MG tablet Take 1 tablet (5 mg total) by mouth at bedtime as needed for sleep.   [DISCONTINUED] aspirin 325 MG tablet Take 325 mg by mouth daily.     Allergies:   Patient has no known allergies.   Social History   Socioeconomic History   Marital status: Significant Other    Spouse name: Not on file   Number of children: 1   Years of education: College degree   Highest education level: Not on file  Occupational History   Occupation: PERSONNEL    Employer: [employer]  Tobacco Use   Smoking status: Former    Packs/day: 0.25    Years: 10.00    Pack years: 2.50    Types: Cigarettes    Start date: 27    Quit date: 07/10/2011    Years since quitting: 10.3   Smokeless tobacco: Never  Vaping Use   Vaping Use: Never used  Substance and Sexual Activity   Alcohol use: Yes    Comment: 1 glass of wine ocassionally   Drug use: No   Sexual activity: Not Currently  Other Topics Concern   Not on file  Social History Narrative   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 There is no history of Colon polyps, Colon cancer, Esophageal cancer, Stomach cancer, or Rectal cancer.  ROS:   Please see the history of present illness.     All other systems reviewed and are negative.  EKGs/Labs/Other Studies Reviewed:    The following studies were reviewed  today:   EKG:   07/20/21: Atrial fibrillation, rate 137, diffuse ST depressions with aVR elevation 08/21/2021: Normal sinus rhythm, rate 60, nonspecific T wave flattening 11/16/21: Sinus bradycardia, rate 59, nonspecific T wave flattening   Recent Labs: No results found for requested labs within last 8760 hours.  Recent Lipid Panel    Component Value Date/Time   CHOL 211 (H) 12/08/2019 0842   TRIG 71 12/08/2019 0842   HDL 64 12/08/2019 0842   CHOLHDL 3.3 12/08/2019 0842   CHOLHDL 4 06/09/2014 0818   VLDL 11.0 06/09/2014 0818   LDLCALC 134 (H) 12/08/2019 0454  Physical Exam:    VS:  BP 115/70   Pulse (!) 59   Ht '5\' 7"'$  (1.702 m)   Wt 178 lb 12.8 oz (81.1 kg)   SpO2 97%   BMI 28.00 kg/m     Wt Readings from Last 3 Encounters:  11/16/21 178 lb 12.8 oz (81.1 kg)  10/06/21 182 lb 6.4 oz (82.7 kg)  08/21/21 191 lb 12.8 oz (87 kg)     GEN:  Well nourished, well developed in no acute distress HEENT: Normal NECK: No JVD; No carotid bruits LYMPHATICS: No lymphadenopathy CARDIAC: RRR, no murmurs, rubs, gallops RESPIRATORY:  Clear to auscultation without rales, wheezing or rhonchi  ABDOMEN: Soft, non-tender, non-distended MUSCULOSKELETAL:  No edema; No deformity  SKIN: Warm and dry NEUROLOGIC:  Alert and oriented x 3 PSYCHIATRIC:  Normal affect   ASSESSMENT:    1. Paroxysmal atrial fibrillation (HCC)   2. Essential hypertension     PLAN:    Atrial fibrillation: Diagnosed initially while under general anesthesia for removal of skin mass.  Rates up to 130s.  Echocardiogram on 09/18/2021 showed normal biventricular function, no significant valvular disease.  Zio patch x14 days on 09/18/2021 showed 3% atrial fibrillation burden, average rate 127 bpm with longest episode lasting 34 minutes.  She was referred to Dr. Quentin Ore in the EP and underwent A-fib ablation on 04/30/2022. -Continue metoprolol 25 mg twice daily for rate control -Referred to neurology as well as discussed  with Dr. Estanislado Pandy about anticoagulation given her history of SAH/brain aneurysm, okay with anticoagulation.  Continue Eliquis 5 mg twice daily for now.  Dr. Quentin Ore planning watchman left atrial appendage occlusion device 07/2022  Hypertension: On hydrochlorothiazide 12.5 mg daily and metoprolol 25 mg twice daily.  Appears controlled  OSA: on CPAP, reports compliance   RTC in 6 months   Medication Adjustments/Labs and Tests Ordered: Current medicines are reviewed at length with the patient today.  Concerns regarding medicines are outlined above.  Orders Placed This Encounter  Procedures   Ambulatory referral to Cardiac Electrophysiology   EKG 12-Lead   Meds ordered this encounter  Medications   apixaban (ELIQUIS) 5 MG TABS tablet    Sig: Take 1 tablet (5 mg total) by mouth 2 (two) times daily.    Dispense:  180 tablet    Refill:  3    Patient Instructions  Medication Instructions:  START Eliquis 5 mg two times daily STOP aspirin  *If you need a refill on your cardiac medications before your next appointment, please call your pharmacy*  Follow-Up: At [facility], you and your health needs are our priority.  As part of our continuing mission to provide you with exceptional heart care, we have created designated Provider Care Teams.  These Care Teams include your primary Cardiologist (physician) and Advanced Practice Providers (APPs -  Physician Assistants and Nurse Practitioners) who all work together to provide you with the care you need, when you need it.  We recommend signing up for the patient portal called "MyChart".  Sign up information is provided on this After Visit Summary.  MyChart is used to connect with patients for Virtual Visits (Telemedicine).  Patients are able to view lab/test results, encounter notes, upcoming appointments, etc.  Non-urgent messages can be sent to your provider as well.   To learn more about what you can do with MyChart, go to  [email or web address].    Your next appointment:   6 month(s)  The format for your next appointment:   In  Person  Provider:   Donato Heinz, MD {     Other Instructions You have been referred to: Dr. Quentin Ore to discuss ablation   Important Information About Sugar         Signed, Donato Heinz, MD  11/16/2021 5:41 PM    [place]

## 2022-05-24 ENCOUNTER — Ambulatory Visit: Payer: Medicare Other | Attending: Cardiology | Admitting: Cardiology

## 2022-05-24 ENCOUNTER — Encounter: Payer: Self-pay | Admitting: Cardiology

## 2022-05-24 VITALS — BP 98/70 | HR 65 | Ht 67.0 in | Wt 184.2 lb

## 2022-05-24 DIAGNOSIS — E785 Hyperlipidemia, unspecified: Secondary | ICD-10-CM | POA: Diagnosis not present

## 2022-05-24 DIAGNOSIS — I480 Paroxysmal atrial fibrillation: Secondary | ICD-10-CM

## 2022-05-24 DIAGNOSIS — I10 Essential (primary) hypertension: Secondary | ICD-10-CM

## 2022-05-24 NOTE — Patient Instructions (Signed)
Medication Instructions:  STOP HCTZ  Please check your blood pressure at home daily, write it down.  Call the office or send message via Mychart with the readings in 2 weeks for Dr. Gardiner Rhyme to review.   *If you need a refill on your cardiac medications before your next appointment, please call your pharmacy*  Follow-Up: At [facility], you and your health needs are our priority.  As part of our continuing mission to provide you with exceptional heart care, we have created designated Provider Care Teams.  These Care Teams include your primary Cardiologist (physician) and Advanced Practice Providers (APPs -  Physician Assistants and Nurse Practitioners) who all work together to provide you with the care you need, when you need it.  We recommend signing up for the patient portal called "MyChart".  Sign up information is provided on this After Visit Summary.  MyChart is used to connect with patients for Virtual Visits (Telemedicine).  Patients are able to view lab/test results, encounter notes, upcoming appointments, etc.  Non-urgent messages can be sent to your provider as well.   To learn more about what you can do with MyChart, go to [email or web address].    Your next appointment:   6 month(s)  The format for your next appointment:   In Person  Provider:   Donato Heinz, MD

## 2022-05-28 ENCOUNTER — Ambulatory Visit (HOSPITAL_COMMUNITY)
Admission: RE | Admit: 2022-05-28 | Discharge: 2022-05-28 | Disposition: A | Discharge status: Home / Self Care | Payer: Medicare Other | Source: Ambulatory Visit | Attending: Physician Assistant | Admitting: Physician Assistant

## 2022-05-28 VITALS — BP 118/68 | HR 64 | Ht 67.0 in | Wt 184.8 lb

## 2022-05-28 DIAGNOSIS — C7982 Secondary malignant neoplasm of genital organs: Secondary | ICD-10-CM | POA: Insufficient documentation

## 2022-05-28 DIAGNOSIS — I729 Aneurysm of unspecified site: Secondary | ICD-10-CM | POA: Diagnosis not present

## 2022-05-28 DIAGNOSIS — D6869 Other thrombophilia: Secondary | ICD-10-CM | POA: Diagnosis not present

## 2022-05-28 DIAGNOSIS — I10 Essential (primary) hypertension: Secondary | ICD-10-CM | POA: Insufficient documentation

## 2022-05-28 DIAGNOSIS — Z7901 Long term (current) use of anticoagulants: Secondary | ICD-10-CM | POA: Insufficient documentation

## 2022-05-28 DIAGNOSIS — G4733 Obstructive sleep apnea (adult) (pediatric): Secondary | ICD-10-CM | POA: Insufficient documentation

## 2022-05-28 DIAGNOSIS — I609 Nontraumatic subarachnoid hemorrhage, unspecified: Secondary | ICD-10-CM | POA: Diagnosis not present

## 2022-05-28 DIAGNOSIS — I480 Paroxysmal atrial fibrillation: Secondary | ICD-10-CM | POA: Diagnosis present

## 2022-05-28 MED ORDER — OMEPRAZOLE 20 MG PO CPDR: 20.0000 mg | DELAYED_RELEASE_CAPSULE | Freq: Every day | ORAL | Status: AC

## 2022-05-28 NOTE — Progress Notes (Signed)
Primary Care Physician: Lilian Coma., MD Primary Cardiologist: Dr Gardiner Rhyme Primary Electrophysiologist: Dr Quentin Ore Referring Physician: Dr Felton Clinton is a 66 y.o. female with a history of brain aneurysm, SAH, cervical cancer, OSA , HTN, atrial fibrillation who presents for consultation in the [facility]. During a procedure for schwannoma removal 07/20/2021 she was told that she had atrial fibrillation with rates up to 130s. She saw Dr. Gardiner Rhyme on 11/16/2021. Zio patch x14 days on 09/18/2021 showed 3% atrial fibrillation burden, average rate 127 bpm with longest episode lasting 34 minutes. Patient is on Eliquis for a CHADS2VASC score of 5. She underwent afib ablation with Dr Quentin Ore on 04/30/22 and is scheduled for Watchman implant on 08/02/22.  On follow up today, patient reports that she has done well since her ablation with no known episodes of afib. She denies any ongoing chest pain, swallowing pain, or groin issues. She does report that she misses her evening dose of Eliquis 1-2 times per week.   Today, she denies symptoms of palpitations, chest pain, shortness of breath, orthopnea, PND, lower extremity edema, dizziness, presyncope, syncope, snoring, daytime somnolence, bleeding, or neurologic sequela. The patient is tolerating medications without difficulties and is otherwise without complaint today.    Atrial Fibrillation Risk Factors:  she does have symptoms or diagnosis of sleep apnea. she is compliant with CPAP therapy. she does not have a history of rheumatic fever.   she has a BMI of Body mass index is 28.94 kg/m.Marland Kitchen Filed Weights   05/28/22 0859  Weight: 83.8 kg    Family History  Problem Relation Age of Onset   Emphysema Father        smoker   Lung cancer Father 23   Prostate cancer Father    Kidney cancer Father    Scleroderma Mother 28   Heart attack Maternal Grandmother    Colon polyps Neg Hx    Colon cancer Neg Hx     Esophageal cancer Neg Hx    Stomach cancer Neg Hx    Rectal cancer Neg Hx      Atrial Fibrillation Management history:  Previous antiarrhythmic drugs: none Previous cardioversions: none Previous ablations: 04/30/22 CHADS2VASC score: 5 Anticoagulation history: Eliquis   Past Medical History:  Diagnosis Date   Brain aneurysm 05/09/12   coil/stent, angio with stent 02/2014   Cervical cancer ([place])    Diverticulosis    Environmental allergies    History of shingles    HTN (hypertension)    Menopause    Migraine    Osteoarthritis    left knee   Personal history of colonic polyps    noted on 2012 colonoscopy   Sleep apnea 06/2011 dx   CPAP 13/apria   Stroke (cerebrum) ([place])    Vertigo    Past Surgical History:  Procedure Laterality Date   ANEURYSM COILING  05/09/2012   middle   ATRIAL FIBRILLATION ABLATION N/A 04/30/2022   Procedure: ATRIAL FIBRILLATION ABLATION;  Surgeon: Vickie Epley, MD;  Location: [facility] [facility];  Service: Cardiovascular;  Laterality: N/A;   Brain stent  2013, 2015   BREAST BIOPSY Right 1988   BENIGN   BREAST BIOPSY Left 1993   BENIGN   COLONOSCOPY  12/13/2020   IR RADIOLOGIST EVAL & MGMT  09/13/2021   KNEE ARTHROSCOPY Left    RADIOLOGY WITH ANESTHESIA N/A 09/03/2012   Procedure: RADIOLOGY WITH ANESTHESIA;  Surgeon: Rob Hickman, MD;  Location: [place];  Service: Radiology;  Laterality: N/A;  aneurysm embolization    RADIOLOGY WITH ANESTHESIA N/A 02/10/2014   Procedure: RADIOLOGY WITH ANESTHESIA, emobolization;  Surgeon: Rob Hickman, MD;  Location: [facility];  Service: Radiology;  Laterality: N/A;    Current Outpatient Medications  Medication Sig Dispense Refill   apixaban (ELIQUIS) 5 MG TABS tablet Take 1 tablet (5 mg total) by mouth 2 (two) times daily. 180 tablet 3   cetirizine (ZYRTEC) 10 MG tablet Take 10 mg by mouth daily.     metoprolol tartrate (LOPRESSOR) 25 MG tablet TAKE 1 TABLET(25 MG) BY MOUTH TWICE DAILY 60 tablet 6    Omega-3 Fatty Acids (FISH OIL PO) Take 1,000 mcg by mouth daily.     Vitamin D, Cholecalciferol, 25 MCG (1000 UT) TABS Take 25 mcg by mouth daily.     zolpidem (AMBIEN) 5 MG tablet Take 1 tablet (5 mg total) by mouth at bedtime as needed for sleep. (Patient taking differently: Take 2.5 mg by mouth at bedtime as needed for sleep.) 30 tablet 5   omeprazole (PRILOSEC) 20 MG capsule Take 1 capsule (20 mg total) by mouth daily.     No current facility-administered medications for this encounter.    No Known Allergies  Social History   Socioeconomic History   Marital status: Significant Other    Spouse name: Not on file   Number of children: 1   Years of education: College degree   Highest education level: Not on file  Occupational History   Occupation: PERSONNEL    Employer: [employer]  Tobacco Use   Smoking status: Former    Packs/day: 0.25    Years: 10.00    Total pack years: 2.50    Types: Cigarettes    Start date: 38    Quit date: 07/10/2011    Years since quitting: 10.8   Smokeless tobacco: Never  Vaping Use   Vaping Use: Never used  Substance and Sexual Activity   Alcohol use: Yes    Comment: 1 glass of wine ocassionally   Drug use: No   Sexual activity: Not Currently  Other Topics Concern   Not on file  Social History Narrative   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  Physical Exam: Vitals:   05/28/22 0859  BP: 118/68  Pulse: 64  Weight: 83.8 kg  Height: '5\' 7"'$  (1.702 m)    GEN- The patient is a well appearing female, alert and oriented x 3 today.   Head- normocephalic,  atraumatic Eyes-  Sclera clear, conjunctiva pink Ears- hearing intact Oropharynx- clear Neck- supple  Lungs- Clear to ausculation bilaterally, normal work of breathing Heart- Regular rate and rhythm, no murmurs, rubs or gallops  GI- soft, NT, ND, + BS Extremities- no clubbing, cyanosis, or edema MS- no significant deformity or atrophy Skin- no rash or lesion Psych- euthymic mood, full affect Neuro- strength and sensation are intact  Wt Readings from Last 3 Encounters:  05/28/22 83.8 kg  05/24/22 83.6 kg  04/30/22 81.6 kg    EKG today demonstrates  SR Vent.  rate 64 BPM PR interval 156 ms QRS duration 84 ms QT/QTcB 408/420 ms  Echo 09/18/21 demonstrated   1. Left ventricular ejection fraction, by estimation, is 65 to 70%. The  left ventricle has normal function. The left ventricle has no regional  wall motion abnormalities. Left ventricular diastolic parameters are  consistent with Grade I diastolic dysfunction (impaired relaxation).   2. Right ventricular systolic function is low normal. The right  ventricular size is normal.   3. The mitral valve is abnormal. Trivial mitral valve regurgitation.   4. The aortic valve is tricuspid. Aortic valve regurgitation is not  visualized.   Epic records are reviewed at length today   HAS-BLED score 3 Hypertension Yes  Abnormal renal and liver function (Dialysis, transplant, Cr >2.26 mg/dL /Cirrhosis or Bilirubin >2x Normal or AST/ALT/AP >3x Normal) No  Stroke Yes  Bleeding Yes  Labile INR (Unstable/high INR) No  Elderly (>65) No  Drugs or alcohol (? 8 drinks/week, anti-plt or NSAID) No   CHA2DS2-VASc Score = 5  The patient's score is based upon: CHF History: 0 HTN History: 1 Diabetes History: 0 Stroke History: 2 Vascular Disease History: 1 Age Score: 0 Gender Score: 1       ASSESSMENT AND PLAN: 1. Paroxysmal Atrial Fibrillation (ICD10:  I48.0) The patient's CHA2DS2-VASc score is 5, indicating a 7.2% annual risk of  stroke.   S/p afib ablation 04/30/22 Patient appears to be maintaining SR. Continue Eliquis 5 mg BID. Stressed importance of not missing doses post ablation.  Continue Lopressor 25 mg BID  2. Secondary Hypercoagulable State (ICD10:  D68.69) The patient is at significant risk for stroke/thromboembolism based upon her CHA2DS2-VASc Score of 5.  Continue Apixaban (Eliquis) for now. Patient scheduled for Watchman implant 08/02/22.  3. HTN Stable, no changes today.  4. Obstructive sleep apnea The importance of adequate treatment of sleep apnea was discussed today in order to improve our ability to maintain sinus rhythm long term. Encouraged compliance with CPAP therapy.   Follow up with structural heart team for Watchman.    [facility] [address] [place], [place] [address] [phone] 05/28/2022 9:19 AM

## 2022-06-10 ENCOUNTER — Encounter: Payer: Self-pay | Admitting: Cardiology

## 2022-07-23 NOTE — Progress Notes (Signed)
[place]                                     Cardiology Office Note:    Date:  07/25/2022   ID:  Shannon Yu, DOB 1955/08/06, MRN [id number]  PCP:  Eileen Stanford, PA-C  [facility] Cardiologist:  Donato Heinz, MD  [facility] Electrophysiologist:  Vickie Epley, MD   Referring MD: Lilian Coma., MD   Pre Watchman visit - 08/02/22  History of Present Illness:    Shannon Yu is a 67 y.o. female with a hx of brain aneurysm, SAH, PAF s/p ablation 04/30/22, cervical cancer, OSA on CPAP who presents to clinic to discuss upcoming [place] with Watchman.   She does have a history of a subarachnoid hemorrhage. She was admitted in November 2013 with subarachnoid hemorrhage. CT a showed aneurysm and she was treated with coil embolization. She was found on angiogram in 2014 to have remnant of aneurysm and underwent additional coiling and stent placement 08/2012. Required further stenting 02/2014.   During a procedure for schwannoma removal 07/20/2021 she was told that she had atrial fibrillation with rates up to 130s. She saw Dr. Gardiner Rhyme on 11/16/2021. Zio patch x14 days on 09/18/2021 showed 3% atrial fibrillation burden, average rate 127 bpm with longest episode lasting 34 minutes. Patient is on Eliquis for a CHADS2VASC score of 5. She underwent afib ablation with Dr Quentin Ore on 04/30/22 and is scheduled for Watchman implant on 08/02/22 given history of intracranial bleeding.   Today the patient presents to clinic for follow up. No CP or SOB. No LE edema, orthopnea or PND. No dizziness or syncope. No blood in stool or urine. No palpitations.    Past Medical History:  Diagnosis Date   Brain aneurysm 05/09/12   coil/stent, angio with stent 02/2014   Cervical cancer ([place])    Diverticulosis    Environmental allergies    History of shingles    HTN (hypertension)    Menopause    Migraine    Osteoarthritis    left knee    Personal history of colonic polyps    noted on 2012 colonoscopy   Sleep apnea 06/2011 dx   CPAP 13/apria   Stroke (cerebrum) ([place])    Vertigo     Past Surgical History:  Procedure Laterality Date   ANEURYSM COILING  05/09/2012   middle   ATRIAL FIBRILLATION ABLATION N/A 04/30/2022   Procedure: ATRIAL FIBRILLATION ABLATION;  Surgeon: Vickie Epley, MD;  Location: [facility];  Service: Cardiovascular;  Laterality: N/A;   Brain stent  2013, 2015   BREAST BIOPSY Right 1988   BENIGN   BREAST BIOPSY Left 1993   BENIGN   COLONOSCOPY  12/13/2020   IR RADIOLOGIST EVAL & MGMT  09/13/2021   KNEE ARTHROSCOPY Left    RADIOLOGY WITH ANESTHESIA N/A 09/03/2012   Procedure: RADIOLOGY WITH ANESTHESIA;  Surgeon: Rob Hickman, MD;  Location: [facility];  Service: Radiology;  Laterality: N/A;  aneurysm embolization    RADIOLOGY WITH ANESTHESIA N/A 02/10/2014   Procedure: RADIOLOGY WITH ANESTHESIA, emobolization;  Surgeon: Rob Hickman, MD;  Location: [facility];  Service: Radiology;  Laterality: N/A;    Current Medications: Current Meds  Medication Sig   apixaban (ELIQUIS) 5 MG TABS tablet Take 1 tablet (5 mg total) by mouth 2 (  two) times daily.   cetirizine (ZYRTEC) 10 MG tablet Take 10 mg by mouth daily.   metoprolol tartrate (LOPRESSOR) 25 MG tablet TAKE 1 TABLET(25 MG) BY MOUTH TWICE DAILY   Omega-3 Fatty Acids (FISH OIL PO) Take 1,000 mcg by mouth daily.   omeprazole (PRILOSEC) 20 MG capsule Take 1 capsule (20 mg total) by mouth daily.   Vitamin D, Cholecalciferol, 25 MCG (1000 UT) TABS Take 25 mcg by mouth daily.   zolpidem (AMBIEN) 5 MG tablet Take 1 tablet (5 mg total) by mouth at bedtime as needed for sleep. (Patient taking differently: Take 2.5 mg by mouth at bedtime as needed for sleep.)     Allergies:   Patient has no known allergies.   Social History   Socioeconomic History   Marital status: Significant Other    Spouse name: Not on file   Number of children: 1    Years of education: College degree   Highest education level: Not on file  Occupational History   Occupation: PERSONNEL    Employer: [employer]  Tobacco Use   Smoking status: Former    Packs/day: 0.25    Years: 10.00    Total pack years: 2.50    Types: Cigarettes    Start date: 76    Quit date: 07/10/2011    Years since quitting: 11.0   Smokeless tobacco: Never  Vaping Use   Vaping Use: Never used  Substance and Sexual Activity   Alcohol use: Yes    Comment: 1 glass of wine ocassionally   Drug use: No   Sexual activity: Not Currently  Other Topics Concern   Not on file  Social History Narrative   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 There is no history of Colon polyps, Colon cancer, Esophageal cancer, Stomach cancer, or Rectal cancer.  ROS:   Please see the history of present illness.    All other systems reviewed and are negative.  EKGs/Labs/Other Studies Reviewed:    The following studies were reviewed today:  Echo 09/18/21 demonstrated   1. Left ventricular ejection fraction, by estimation, is 65 to 70%. The  left ventricle has normal function. The left ventricle has no regional  wall motion abnormalities. Left ventricular diastolic parameters are  consistent with Grade I diastolic dysfunction (impaired relaxation).   2. Right ventricular systolic function is low normal. The right  ventricular size is normal.   3. The mitral  valve is abnormal. Trivial mitral valve regurgitation.   4. The aortic valve is tricuspid. Aortic valve regurgitation is not  visualized.     EKG:  EKG is ordered today.  The ekg ordered today demonstrates sinus, HR 66  Recent Labs: 04/09/2022: BUN 15; Creatinine, Ser 0.85; Hemoglobin 13.2; Platelets 226; Potassium 3.7; Sodium 140  Recent Lipid Panel    Component Value Date/Time   CHOL 211 (H) 12/08/2019 0842   TRIG 71 12/08/2019 0842   HDL  64 12/08/2019 0842   CHOLHDL 3.3 12/08/2019 0842   CHOLHDL 4 06/09/2014 0818   VLDL 11.0 06/09/2014 0818   LDLCALC 134 (H) 12/08/2019 0842     Risk Assessment/Calculations:    HAS-BLED score 3 Hypertension Yes  Abnormal renal and liver function (Dialysis, transplant, Cr >2.26 mg/dL /Cirrhosis or Bilirubin >2x Normal or AST/ALT/AP >3x Normal) No  Stroke Yes  Bleeding Yes  Labile INR (Unstable/high INR) No  Elderly (>65) No  Drugs or alcohol (? 8 drinks/week, anti-plt or NSAID) No     CHA2DS2-VASc Score = 5  The patient's score is based upon: CHF History: 0 HTN History: 1 Diabetes History: 0 Stroke History: 2 Vascular Disease History: 1 Age Score: 0 Gender Score: 1   Physical Exam:    VS:  BP 118/60   Pulse 67   Ht '5\' 8"'$  (1.727 m)   Wt 188 lb 6.4 oz (85.5 kg)   SpO2 97%   BMI 28.65 kg/m     Wt Readings from Last 3 Encounters:  07/25/22 188 lb 6.4 oz (85.5 kg)  05/28/22 184 lb 12.8 oz (83.8 kg)  05/24/22 184 lb 3.2 oz (83.6 kg)     GEN:  Well nourished, well developed in no acute distress HEENT: Normal NECK: No JVD LYMPHATICS: No lymphadenopathy CARDIAC: RRR, no murmurs, rubs, gallops RESPIRATORY:  Clear to auscultation without rales, wheezing or rhonchi  ABDOMEN: Soft, non-tender, non-distended MUSCULOSKELETAL:  No edema; No deformity  SKIN: Warm and dry NEUROLOGIC:  Alert and oriented x 3 PSYCHIATRIC:  Normal affect   ASSESSMENT:    1. Paroxysmal atrial fibrillation (HCC)   2. Hypercoagulable state due to  paroxysmal atrial fibrillation (HCC)   3. SAH (subarachnoid hemorrhage) (HCC)    PLAN:    In order of problems listed above:  PAF s/p ablation with history of SAH: doing well with no recurrent afib symptoms. Plan for [place] with Watchman on 1/25. ECG/CBC/BMET today. Pt given soap and instructions.    Medication Adjustments/Labs and Tests Ordered: Current medicines are reviewed at length with the patient today.  Concerns regarding medicines are outlined above.  Orders Placed This Encounter  Procedures   CBC   Basic metabolic panel   EKG 27-OJJK   No orders of the defined types were placed in this encounter.   Patient Instructions  Medication Instructions:  Your physician recommends that you continue on your current medications as directed. Please refer to the Current Medication list given to you today.  *If you need a refill on your cardiac medications before your next appointment, please call your pharmacy*   Lab Work: BMET, CBC If you have labs (blood work) drawn today and your tests are completely normal, you will receive your results only by: [place] (if you have MyChart) OR A paper copy in the mail If you have any lab test that is abnormal or we need to change your treatment, we will call you to review the results.   Follow-Up: At [facility], you and your health needs are our priority.  As part of our continuing mission to provide you with exceptional heart care, we have created designated Provider Care Teams.  These Care Teams include your primary Cardiologist (physician) and Advanced Practice Providers (APPs -  Physician Assistants and Nurse Practitioners) who all work together to provide you with the care you need, when you need it.  Your next appointment:   To be determined   Provider:   Structural Team  Signed, Angelena Form, PA-C  07/25/2022 3:20 PM    [facility]

## 2022-07-25 ENCOUNTER — Ambulatory Visit: Payer: Medicare Other | Attending: Physician Assistant | Admitting: Physician Assistant

## 2022-07-25 VITALS — BP 118/60 | HR 67 | Ht 68.0 in | Wt 188.4 lb

## 2022-07-25 DIAGNOSIS — I609 Nontraumatic subarachnoid hemorrhage, unspecified: Secondary | ICD-10-CM | POA: Diagnosis not present

## 2022-07-25 DIAGNOSIS — D6869 Other thrombophilia: Secondary | ICD-10-CM | POA: Insufficient documentation

## 2022-07-25 DIAGNOSIS — I480 Paroxysmal atrial fibrillation: Secondary | ICD-10-CM | POA: Insufficient documentation

## 2022-07-25 NOTE — Patient Instructions (Signed)
Medication Instructions:  Your physician recommends that you continue on your current medications as directed. Please refer to the Current Medication list given to you today.  *If you need a refill on your cardiac medications before your next appointment, please call your pharmacy*   Lab Work: BMET, CBC If you have labs (blood work) drawn today and your tests are completely normal, you will receive your results only by: [place] (if you have MyChart) OR A paper copy in the mail If you have any lab test that is abnormal or we need to change your treatment, we will call you to review the results.   Follow-Up: At [facility], you and your health needs are our priority.  As part of our continuing mission to provide you with exceptional heart care, we have created designated Provider Care Teams.  These Care Teams include your primary Cardiologist (physician) and Advanced Practice Providers (APPs -  Physician Assistants and Nurse Practitioners) who all work together to provide you with the care you need, when you need it.  Your next appointment:   To be determined   Provider:   Structural Team

## 2022-07-26 LAB — CBC
Hematocrit: 39.7 % (ref 34.0–46.6)
Hemoglobin: 13.3 g/dL (ref 11.1–15.9)
MCH: 32.6 pg (ref 26.6–33.0)
MCHC: 33.5 g/dL (ref 31.5–35.7)
MCV: 97 fL (ref 79–97)
Platelets: 231 10*3/uL (ref 150–450)
RBC: 4.08 x10E6/uL (ref 3.77–5.28)
RDW: 12.3 % (ref 11.7–15.4)
WBC: 5.7 10*3/uL (ref 3.4–10.8)

## 2022-07-26 LAB — BASIC METABOLIC PANEL
BUN/Creatinine Ratio: 23 (ref 12–28)
BUN: 17 mg/dL (ref 8–27)
CO2: 24 mmol/L (ref 20–29)
Calcium: 9.6 mg/dL (ref 8.7–10.3)
Chloride: 102 mmol/L (ref 96–106)
Creatinine, Ser: 0.73 mg/dL (ref 0.57–1.00)
Glucose: 81 mg/dL (ref 70–99)
Potassium: 3.9 mmol/L (ref 3.5–5.2)
Sodium: 139 mmol/L (ref 134–144)
eGFR: 91 mL/min/{1.73_m2} (ref >59)

## 2022-07-31 NOTE — Progress Notes (Signed)
Patient's surgery is scheduled for Thursday, January 25th, at 07:45 o'clock with Dr. Quentin Ore. Patient will be at the hospital at 05:30 o'clock. Patient received pre-procedure instructions. Patient was instructed to call if he will have questions/concerns prior to procedure.

## 2022-08-01 ENCOUNTER — Telehealth: Payer: Self-pay

## 2022-08-01 NOTE — Telephone Encounter (Signed)
Left message to call back.

## 2022-08-01 NOTE — Anesthesia Preprocedure Evaluation (Signed)
Anesthesia Evaluation  Patient identified by MRN, date of birth, ID band Patient awake    Reviewed: Allergy & Precautions, NPO status , Patient's Chart, lab work & pertinent test results  History of Anesthesia Complications (+) PONV and history of anesthetic complications  Airway Mallampati: II  TM Distance: >3 FB Neck ROM: Full    Dental no notable dental hx.    Pulmonary sleep apnea and Continuous Positive Airway Pressure Ventilation , former smoker   Pulmonary exam normal        Cardiovascular hypertension, Pt. on medications and Pt. on home beta blockers + dysrhythmias Atrial Fibrillation  Rhythm:Irregular Rate:Normal     Neuro/Psych  Headaches Aneurysm s/p stent 2015 CVA  negative psych ROS   GI/Hepatic Neg liver ROS,GERD  Medicated,,  Endo/Other  negative endocrine ROS    Renal/GU negative Renal ROS  negative genitourinary   Musculoskeletal  (+) Arthritis , Osteoarthritis,    Abdominal Normal abdominal exam  (+)   Peds  Hematology negative hematology ROS (+)   Anesthesia Other Findings   Reproductive/Obstetrics                             Anesthesia Physical Anesthesia Plan  ASA: 3  Anesthesia Plan: General   Post-op Pain Management:    Induction: Intravenous  PONV Risk Score and Plan: 4 or greater and Ondansetron, Dexamethasone, Midazolam, Treatment may vary due to age or medical condition, TIVA and Amisulpride  Airway Management Planned: Mask and Oral ETT  Additional Equipment: ClearSight  Intra-op Plan:   Post-operative Plan: Extubation in OR  Informed Consent: I have reviewed the patients History and Physical, chart, labs and discussed the procedure including the risks, benefits and alternatives for the proposed anesthesia with the patient or authorized representative who has indicated his/her understanding and acceptance.     Dental advisory given  Plan  Discussed with:   Anesthesia Plan Comments: (Lab Results      Component                Value               Date                      WBC                      5.7                 07/25/2022                HGB                      13.3                07/25/2022                HCT                      39.7                07/25/2022                MCV                      97                  07/25/2022  PLT                      231                 07/25/2022             Lab Results      Component                Value               Date                      NA                       139                 07/25/2022                K                        3.9                 07/25/2022                CO2                      24                  07/25/2022                GLUCOSE                  81                  07/25/2022                BUN                      17                  07/25/2022                CREATININE               0.73                07/25/2022                CALCIUM                  9.6                 07/25/2022                EGFR                     91                  07/25/2022                GFRNONAA                 70                  07/31/2019           )       Anesthesia Quick Evaluation

## 2022-08-01 NOTE — Telephone Encounter (Signed)
Confirmed procedure date of 08/02/2022. Confirmed arrival time of 0530 for procedure time at 0730. Reviewed pre-procedure instructions with patient. The patient understands to call if questions/concerns arise prior to procedure.

## 2022-08-02 ENCOUNTER — Other Ambulatory Visit: Payer: Self-pay

## 2022-08-02 ENCOUNTER — Encounter (HOSPITAL_COMMUNITY)
Admission: RE | Disposition: A | Discharge status: Home / Self Care | Payer: Self-pay | Source: Home / Self Care | Attending: Cardiology

## 2022-08-02 ENCOUNTER — Inpatient Hospital Stay (HOSPITAL_COMMUNITY): Payer: Medicare Other

## 2022-08-02 ENCOUNTER — Encounter (HOSPITAL_COMMUNITY): Payer: Self-pay | Admitting: Cardiology

## 2022-08-02 ENCOUNTER — Inpatient Hospital Stay (HOSPITAL_COMMUNITY): Payer: Medicare Other | Admitting: Anesthesiology

## 2022-08-02 ENCOUNTER — Inpatient Hospital Stay (HOSPITAL_COMMUNITY)
Admission: RE | Admit: 2022-08-02 | Discharge: 2022-08-02 | DRG: 274 | Disposition: A | Discharge status: Home / Self Care | Payer: Medicare Other | Attending: Cardiology | Admitting: Cardiology

## 2022-08-02 DIAGNOSIS — Z8719 Personal history of other diseases of the digestive system: Secondary | ICD-10-CM | POA: Diagnosis not present

## 2022-08-02 DIAGNOSIS — Z8051 Family history of malignant neoplasm of kidney: Secondary | ICD-10-CM | POA: Diagnosis not present

## 2022-08-02 DIAGNOSIS — Z87891 Personal history of nicotine dependence: Secondary | ICD-10-CM

## 2022-08-02 DIAGNOSIS — I10 Essential (primary) hypertension: Secondary | ICD-10-CM

## 2022-08-02 DIAGNOSIS — Z8673 Personal history of transient ischemic attack (TIA), and cerebral infarction without residual deficits: Secondary | ICD-10-CM | POA: Diagnosis not present

## 2022-08-02 DIAGNOSIS — I480 Paroxysmal atrial fibrillation: Principal | ICD-10-CM | POA: Diagnosis present

## 2022-08-02 DIAGNOSIS — Z8619 Personal history of other infectious and parasitic diseases: Secondary | ICD-10-CM | POA: Diagnosis not present

## 2022-08-02 DIAGNOSIS — Z9989 Dependence on other enabling machines and devices: Secondary | ICD-10-CM | POA: Diagnosis not present

## 2022-08-02 DIAGNOSIS — Z8541 Personal history of malignant neoplasm of cervix uteri: Secondary | ICD-10-CM

## 2022-08-02 DIAGNOSIS — Z8679 Personal history of other diseases of the circulatory system: Secondary | ICD-10-CM

## 2022-08-02 DIAGNOSIS — I4891 Unspecified atrial fibrillation: Secondary | ICD-10-CM | POA: Diagnosis present

## 2022-08-02 DIAGNOSIS — I671 Cerebral aneurysm, nonruptured: Secondary | ICD-10-CM | POA: Diagnosis present

## 2022-08-02 DIAGNOSIS — G4733 Obstructive sleep apnea (adult) (pediatric): Secondary | ICD-10-CM | POA: Diagnosis present

## 2022-08-02 DIAGNOSIS — Z95818 Presence of other cardiac implants and grafts: Principal | ICD-10-CM

## 2022-08-02 DIAGNOSIS — Z825 Family history of asthma and other chronic lower respiratory diseases: Secondary | ICD-10-CM | POA: Diagnosis not present

## 2022-08-02 DIAGNOSIS — I609 Nontraumatic subarachnoid hemorrhage, unspecified: Secondary | ICD-10-CM

## 2022-08-02 DIAGNOSIS — Z006 Encounter for examination for normal comparison and control in clinical research program: Secondary | ICD-10-CM

## 2022-08-02 DIAGNOSIS — Z8042 Family history of malignant neoplasm of prostate: Secondary | ICD-10-CM | POA: Diagnosis not present

## 2022-08-02 DIAGNOSIS — Z8249 Family history of ischemic heart disease and other diseases of the circulatory system: Secondary | ICD-10-CM | POA: Diagnosis not present

## 2022-08-02 DIAGNOSIS — Z79899 Other long term (current) drug therapy: Secondary | ICD-10-CM

## 2022-08-02 DIAGNOSIS — Z801 Family history of malignant neoplasm of trachea, bronchus and lung: Secondary | ICD-10-CM

## 2022-08-02 DIAGNOSIS — Z7901 Long term (current) use of anticoagulants: Secondary | ICD-10-CM

## 2022-08-02 HISTORY — PX: TEE WITHOUT CARDIOVERSION: SHX5443

## 2022-08-02 HISTORY — PX: LEFT ATRIAL APPENDAGE OCCLUSION: EP1229

## 2022-08-02 HISTORY — DX: Presence of other cardiac implants and grafts: Z95.818

## 2022-08-02 LAB — ECHO TEE

## 2022-08-02 LAB — SURGICAL PCR SCREEN
MRSA, PCR: NEGATIVE
Staphylococcus aureus: NEGATIVE

## 2022-08-02 LAB — TYPE AND SCREEN
ABO/RH(D): O POS
Antibody Screen: NEGATIVE

## 2022-08-02 LAB — ABO/RH: ABO/RH(D): O POS

## 2022-08-02 LAB — POCT ACTIVATED CLOTTING TIME: Activated Clotting Time: 309 seconds

## 2022-08-02 SURGERY — LEFT ATRIAL APPENDAGE OCCLUSION
Anesthesia: General

## 2022-08-02 MED ORDER — ONDANSETRON HCL 4 MG/2ML IJ SOLN
INTRAMUSCULAR | Status: DC | PRN
  Administered 2022-08-02: 4 mg via INTRAVENOUS

## 2022-08-02 MED ORDER — PHENYLEPHRINE HCL-NACL 20-0.9 MG/250ML-% IV SOLN
INTRAVENOUS | Status: DC | PRN
  Administered 2022-08-02: 25 ug/min via INTRAVENOUS

## 2022-08-02 MED ORDER — HEPARIN (PORCINE) IN NACL 1000-0.9 UT/500ML-% IV SOLN
INTRAVENOUS | Status: AC
  Filled 2022-08-02: qty 500

## 2022-08-02 MED ORDER — ONDANSETRON HCL 4 MG/2ML IJ SOLN: 4.0000 mg | Freq: Four times a day (QID) | INTRAMUSCULAR | Status: DC | PRN

## 2022-08-02 MED ORDER — CEFAZOLIN SODIUM-DEXTROSE 2-4 GM/100ML-% IV SOLN
2.0000 g | INTRAVENOUS | Status: AC
  Administered 2022-08-02: 2 g via INTRAVENOUS
  Filled 2022-08-02: qty 100

## 2022-08-02 MED ORDER — SODIUM CHLORIDE 0.9 % IV SOLN: 250.0000 mL | INTRAVENOUS | Status: DC | PRN

## 2022-08-02 MED ORDER — CHLORHEXIDINE GLUCONATE 0.12 % MT SOLN
OROMUCOSAL | Status: AC
  Administered 2022-08-02: 15 mL
  Filled 2022-08-02: qty 15

## 2022-08-02 MED ORDER — ACETAMINOPHEN 325 MG PO TABS: 650.0000 mg | ORAL_TABLET | ORAL | Status: DC | PRN

## 2022-08-02 MED ORDER — PROPOFOL 10 MG/ML IV BOLUS
INTRAVENOUS | Status: DC | PRN
  Administered 2022-08-02: 140 mg via INTRAVENOUS

## 2022-08-02 MED ORDER — FENTANYL CITRATE (PF) 100 MCG/2ML IJ SOLN
INTRAMUSCULAR | Status: DC | PRN
  Administered 2022-08-02: 50 ug via INTRAVENOUS

## 2022-08-02 MED ORDER — ROCURONIUM BROMIDE 10 MG/ML (PF) SYRINGE
PREFILLED_SYRINGE | INTRAVENOUS | Status: DC | PRN
  Administered 2022-08-02: 20 mg via INTRAVENOUS
  Administered 2022-08-02: 50 mg via INTRAVENOUS

## 2022-08-02 MED ORDER — DEXAMETHASONE SODIUM PHOSPHATE 10 MG/ML IJ SOLN
INTRAMUSCULAR | Status: DC | PRN
  Administered 2022-08-02: 10 mg via INTRAVENOUS

## 2022-08-02 MED ORDER — LACTATED RINGERS IV SOLN: INTRAVENOUS | Status: DC

## 2022-08-02 MED ORDER — HEPARIN SODIUM (PORCINE) 1000 UNIT/ML IJ SOLN
INTRAMUSCULAR | Status: DC | PRN
  Administered 2022-08-02: 13000 [IU] via INTRAVENOUS

## 2022-08-02 MED ORDER — HEPARIN (PORCINE) IN NACL 2000-0.9 UNIT/L-% IV SOLN
INTRAVENOUS | Status: AC
  Filled 2022-08-02: qty 1000

## 2022-08-02 MED ORDER — SODIUM CHLORIDE 0.9% FLUSH: 3.0000 mL | INTRAVENOUS | Status: DC | PRN

## 2022-08-02 MED ORDER — PROPOFOL 500 MG/50ML IV EMUL
INTRAVENOUS | Status: DC | PRN
  Administered 2022-08-02: 150 ug/kg/min via INTRAVENOUS

## 2022-08-02 MED ORDER — HEPARIN (PORCINE) IN NACL 2000-0.9 UNIT/L-% IV SOLN
INTRAVENOUS | Status: DC | PRN
  Administered 2022-08-02: 1000 mL

## 2022-08-02 MED ORDER — LIDOCAINE 2% (20 MG/ML) 5 ML SYRINGE
INTRAMUSCULAR | Status: DC | PRN
  Administered 2022-08-02: 60 mg via INTRAVENOUS

## 2022-08-02 MED ORDER — SODIUM CHLORIDE 0.9% FLUSH: 3.0000 mL | Freq: Two times a day (BID) | INTRAVENOUS | Status: DC

## 2022-08-02 MED ORDER — SUGAMMADEX SODIUM 200 MG/2ML IV SOLN
INTRAVENOUS | Status: DC | PRN
  Administered 2022-08-02: 200 mg via INTRAVENOUS

## 2022-08-02 MED ORDER — PROTAMINE SULFATE 10 MG/ML IV SOLN
INTRAVENOUS | Status: DC | PRN
  Administered 2022-08-02: 30 mg via INTRAVENOUS

## 2022-08-02 MED ORDER — CHLORHEXIDINE GLUCONATE 4 % EX LIQD: Freq: Once | CUTANEOUS | Status: DC

## 2022-08-02 MED ORDER — MIDAZOLAM HCL 2 MG/2ML IJ SOLN
INTRAMUSCULAR | Status: DC | PRN
  Administered 2022-08-02: 2 mg via INTRAVENOUS

## 2022-08-02 MED ORDER — SODIUM CHLORIDE 0.9 % IV SOLN: INTRAVENOUS | Status: DC

## 2022-08-02 MED ORDER — HEPARIN (PORCINE) IN NACL 1000-0.9 UT/500ML-% IV SOLN
INTRAVENOUS | Status: DC | PRN
  Administered 2022-08-02: 500 mL

## 2022-08-02 SURGICAL SUPPLY — 19 items
BLANKET WARM UNDERBOD FULL ACC (MISCELLANEOUS) ×1 IMPLANT
CATH DIAG 6FR PIGTAIL ANGLED (CATHETERS) IMPLANT
CLOSURE PERCLOSE PROSTYLE (VASCULAR PRODUCTS) IMPLANT
DEVICE WATCHMAN FLX PROC (KITS) IMPLANT
DILATOR VESSEL 38 20CM 11FR (INTRODUCER) IMPLANT
KIT HEART LEFT (KITS) ×1 IMPLANT
KIT SHEA VERSACROSS LAAC CONNE (KITS) IMPLANT
PACK CARDIAC CATHETERIZATION (CUSTOM PROCEDURE TRAY) ×1 IMPLANT
PAD DEFIB RADIO PHYSIO CONN (PAD) ×1 IMPLANT
SHEATH PERFORMER 16FR 30 (SHEATH) IMPLANT
SHEATH PINNACLE 8F 10CM (SHEATH) IMPLANT
SHEATH PROBE COVER 6X72 (BAG) ×1 IMPLANT
SYS WATCHMAN FXD DBL (SHEATH) ×1
SYSTEM WATCHMAN FXD DBL (SHEATH) IMPLANT
TRANSDUCER W/STOPCOCK (MISCELLANEOUS) ×1 IMPLANT
TUBING CIL FLEX 10 FLL-RA (TUBING) ×1 IMPLANT
WATCHMAN FLX 27 (Prosthesis & Implant Heart) IMPLANT
WATCHMAN FLX PROCEDURE DEVICE (KITS) ×1 IMPLANT
WATCHMAN PROCED TRUSEAL ACCESS (SHEATH) IMPLANT

## 2022-08-02 NOTE — Discharge Summary (Signed)
[facility]    Patient ID: Shannon Yu,  MRN: [id number], DOB/AGE: April 25, 1956 67 y.o.  Admit date: 08/02/2022 Discharge date: 08/02/2022  Primary Care Physician: Lilian Coma., MD  Primary Cardiologist: Donato Heinz, MD  Electrophysiologist: Vickie Epley, MD  Primary Discharge Diagnosis:  Paroxysmal Atrial Fibrillation Poor candidacy for long term anticoagulation due to h/o intracranial hemorrhage  Secondary Discharge Diagnosis:  [facility] -Cervical cancer -OSA on CPAP   Procedures This Admission:  Transeptal Puncture Intra-procedural TEE which showed no LAA thrombus Left atrial appendage occlusive device placement on 08/02/22 by Dr. Quentin Ore.   Brief HPI: Shannon Yu is a 67 y.o. female with a history of brain aneurysm, SAH, PAF s/p ablation 04/30/22, cervical cancer, OSA on CPAP who presents for scheduled LAAO closure with Watchman 08/02/22.     Shannon Yu was admitted in 05/2012 with subarachnoid hemorrhage. CT a showed aneurysm and she was treated with coil embolization. 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 She then required additional stenting 02/2014.    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 She saw Dr. Gardiner Rhyme on 11/16/2021. Zio patch x14 days was placed on 09/18/2021 which showed 3% atrial fibrillation burden, average rate 127 bpm with longest episode lasting 34 minutes. She has been on Eliquis for a CHADS2VASC score of 5. She underwent AF ablation with Dr Quentin Ore on 04/30/22 and is scheduled for Watchman implant on 08/02/22 given history of intracranial bleeding.    Hospital Course:  The patient was admitted and underwent left atrial appendage occlusive device placement with Watchman FLX 26m device. She was monitored on telemetry in the post procedure setting which demonstrated NSR. Groin  site has been stable with no evidence of hematoma or bleeding. Wound care and restrictions were reviewed with the patient. The patient has been scheduled for post procedure follow up with JKathyrn Drown NP in approximately 1 month. She will be restarted on home Eliquis '5mg'$  BID this evening and will continue this for approximately 6 weeks then transition to ASA '81mg'$  and Plavix '75mg'$  through 6 months. Repeat imaging will be performed at 8 weeks to ensure no device leak or thrombus. She will require dental SBE with amoxicillin for 6 months and should avoid dental procedures for the next 4 weeks.   Physical Exam: Vitals:   08/02/22 1048 08/02/22 1100 08/02/22 1115 08/02/22 1129  BP: 118/71 116/70 111/66   Pulse: 73 70 75   Resp: '15 15 18   '$ Temp: 98.1 F (36.7 C)  98.6 F (37 C)   TempSrc: Oral  Oral   SpO2: 94% 92% 90% 93%  Weight:      Height:       General: Well developed, well nourished, NAD Cardiovascular: RRR with S1 S2. No murmurs Extremities: No edema. Groin site level 0. Neuro: Alert and oriented. No focal deficits. No facial asymmetry. MAE spontaneously. Psych: Responds to questions appropriately with normal affect.    Labs:   Lab Results  Component Value Date   WBC 5.7 07/25/2022   HGB 13.3 07/25/2022   HCT 39.7 07/25/2022   MCV 97 07/25/2022   PLT 231 07/25/2022   No results for input(s): "NA", "K", "CL", "CO2", "BUN", "CREATININE", "CALCIUM", "PROT", "BILITOT", "ALKPHOS", "ALT", "AST", "GLUCOSE" in the last 168 hours.  Invalid input(s): "LABALBU"  Discharge Medications:  Allergies as of 08/02/2022   No Known  Allergies      Medication List     TAKE these medications    apixaban 5 MG Tabs tablet Commonly known as: ELIQUIS Take 1 tablet (5 mg total) by mouth 2 (two) times daily. Notes to patient: Restart Eliquis for your evening dose tonight, 08/02/22.    cetirizine 10 MG tablet Commonly known as: ZYRTEC Take 10 mg by mouth 2 (two) times daily.   CLEAR EYES  OP Place 1 drop into both eyes daily.   Fish Oil 1200 MG Caps Take 1,200 mg by mouth daily.   metoprolol tartrate 25 MG tablet Commonly known as: LOPRESSOR TAKE 1 TABLET(25 MG) BY MOUTH TWICE DAILY   omeprazole 20 MG capsule Commonly known as: PRILOSEC Take 1 capsule (20 mg total) by mouth daily.   Vitamin D (Cholecalciferol) 25 MCG (1000 UT) Tabs Take 25 mcg by mouth daily.   zolpidem 5 MG tablet Commonly known as: AMBIEN Take 1 tablet (5 mg total) by mouth at bedtime as needed for sleep.        Disposition:  Home  Discharge Instructions     Call MD for:  difficulty breathing, headache or visual disturbances   Complete by: As directed    Call MD for:  extreme fatigue   Complete by: As directed    Call MD for:  hives   Complete by: As directed    Call MD for:  persistant dizziness or light-headedness   Complete by: As directed    Call MD for:  persistant nausea and vomiting   Complete by: As directed    Call MD for:  redness, tenderness, or signs of infection (pain, swelling, redness, odor or green/yellow discharge around incision site)   Complete by: As directed    Call MD for:  severe uncontrolled pain   Complete by: As directed    Call MD for:  temperature >100.4   Complete by: As directed    Diet - low sodium heart healthy   Complete by: As directed    Discharge instructions   Complete by: As directed    [facility] Procedure, Care After  Procedure MD: Dr. Benson Norway Clinical Coordinator: Lenice Llamas, RN  This sheet gives you information about how to care for yourself after your procedure. Your health care provider may also give you more specific instructions. If you have problems or questions, contact your health care provider.  What can I expect after the procedure? After the procedure, it is common to have: Bruising around your puncture site. Tenderness around your puncture site. Tiredness (fatigue).  Medication instructions It is very important  to continue to take your blood thinner as directed by your doctor after the Watchman procedure. Call your procedure doctor's office with question or concerns. If you are on Coumadin (warfarin), you will have your INR checked the week after your procedure, with a goal INR of 2.0 - 3.0. Please follow your medication instructions on your discharge summary. Only take the medications listed on your discharge paperwork.  Follow up You will be seen in 1 month after your procedure You will have a repeat CT scan approximately 8 weeks after your procedure mark to check your device You will follow up the MD/APP who performed your procedure 6 months after your procedure The Watchman Clinical Coordinator will check in with you from time to time, including 1 and 2 years after your procedure.    Follow these instructions at home: Puncture site care  Follow instructions from your health care  provider about how to take care of your puncture site. Make sure you: If present, leave stitches (sutures), skin glue, or adhesive strips in place.  If a large square bandage is present, this may be removed 24 hours after surgery.  Check your puncture site every day for signs of infection. Check for: Redness, swelling, or pain. Fluid or blood. If your puncture site starts to bleed, lie down on your back, apply firm pressure to the area, and contact your health care provider. Warmth. Pus or a bad smell. Driving Do not drive yourself home if you received sedation Do not drive for at least 4 days after your procedure or however long your health care provider recommends. (Do not resume driving if you have previously been instructed not to drive for other health reasons.) Do not spend greater than 1 hour at a time in a car for the first 3 days. Stop and take a break with a 5 minute walk at least every hour.  Do not drive or use heavy machinery while taking prescription pain medicine.  Activity Avoid activities that take a  lot of effort, including exercise, for at least 7 days after your procedure. For the first 3 days, avoid sitting for longer than one hour at a time.  Avoid alcoholic beverages, signing paperwork, or participating in legal proceedings for 24 hours after receiving sedation Do not lift anything that is heavier than 10 lb (4.5 kg) for one week.  No sexual activity for 1 week.  Return to your normal activities as told by your health care provider. Ask your health care provider what activities are safe for you. General instructions Take over-the-counter and prescription medicines only as told by your health care provider. Do not use any products that contain nicotine or tobacco, such as cigarettes and e-cigarettes. If you need help quitting, ask your health care provider. You may shower after 24 hours, but Do not take baths, swim, or use a hot tub for 1 week.  Do not drink alcohol for 24 hours after your procedure. Keep all follow-up visits as told by your health care provider. This is important. Dental Work: You will require antibiotics prior to any dental work, including cleanings, for 6 months after your Watchman implantation to help protect you from infection. After 6 months, antibiotics are no longer required. Contact a health care provider if: You have redness, mild swelling, or pain around your puncture site. You have soreness in your throat or at your puncture site that does not improve after several days You have fluid or blood coming from your puncture site that stops after applying firm pressure to the area. Your puncture site feels warm to the touch. You have pus or a bad smell coming from your puncture site. You have a fever. You have chest pain or discomfort that spreads to your neck, jaw, or arm. You are sweating a lot. You feel nauseous. You have a fast or irregular heartbeat. You have shortness of breath. You are dizzy or light-headed and feel the need to lie down. You have pain  or numbness in the arm or leg closest to your puncture site. Get help right away if: Your puncture site suddenly swells. Your puncture site is bleeding and the bleeding does not stop after applying firm pressure to the area. These symptoms may represent a serious problem that is an emergency. Do not wait to see if the symptoms will go away. Get medical help right away. Call your local emergency services (  911 in the [place] Do not drive yourself to the hospital. Summary After the procedure, it is normal to have bruising and tenderness at the puncture site in your groin, neck, or forearm. Check your puncture site every day for signs of infection. Get help right away if your puncture site is bleeding and the bleeding does not stop after applying firm pressure to the area. This is a medical emergency.  This information is not intended to replace advice given to you by your health care provider. Make sure you discuss any questions you have with your health care provider.   Increase activity slowly   Complete by: As directed        Follow-up Information     Tommie Raymond, NP Follow up on 09/10/2022.   Specialty: Cardiology Why: @ 845am. Please arrive at 830am. Contact information: [address] [place] [address] [phone]                Duration of Discharge Encounter: Greater than 30 minutes including physician time.  Signed, Kathyrn Drown, NP  08/02/2022 3:13 PM

## 2022-08-02 NOTE — Progress Notes (Signed)
  Echocardiogram Echocardiogram Transesophageal has been performed.  Darlina Sicilian M 08/02/2022, 9:33 AM

## 2022-08-02 NOTE — Progress Notes (Signed)
Patient provided with verbal discharge instructions. Paper copy of discharge provided to patient. RN answered all questions. VSS at discharge. IV removed. Patient belongings sent with patient. Patient dc'd via wheelchair to private vehicle at [employer].

## 2022-08-02 NOTE — Transfer of Care (Signed)
Immediate Anesthesia Transfer of Care Note  Patient: Shannon Yu  Procedure(s) Performed: LEFT ATRIAL APPENDAGE OCCLUSION TRANSESOPHAGEAL ECHOCARDIOGRAM (TEE)  Patient Location: Cath Lab  Anesthesia Type:General  Level of Consciousness: drowsy and patient cooperative  Airway & Oxygen Therapy: Patient Spontanous Breathing and Patient connected to nasal cannula oxygen  Post-op Assessment: Report given to RN  Post vital signs: Reviewed and stable  Last Vitals:  Vitals Value Taken Time  BP 124/67 08/02/22 0949  Temp 36.8 C 08/02/22 0946  Pulse 61 08/02/22 0952  Resp 15 08/02/22 0952  SpO2 95 % 08/02/22 0952  Vitals shown include unvalidated device data.  Last Pain:  Vitals:   08/02/22 0925  TempSrc:   PainSc: 0-No pain      Patients Stated Pain Goal: 0 (37/54/36 0677)  Complications: There were no known notable events for this encounter.

## 2022-08-02 NOTE — Progress Notes (Signed)
  [facility] TEAM  Patient doing well s/p LAAO . She is hemodynamically stable. Groin site is stable. Plan for early ambulation after bedrest completed and hopeful discharge later today. Post procedure instructions reviewed along with appointment follow up and plan moving forward.    Kathyrn Drown NP-C Structural Heart Team  Pager: [phone] Phone: [phone]

## 2022-08-02 NOTE — Anesthesia Postprocedure Evaluation (Signed)
Anesthesia Post Note  Patient: Shannon Yu  Procedure(s) Performed: LEFT ATRIAL APPENDAGE OCCLUSION TRANSESOPHAGEAL ECHOCARDIOGRAM (TEE)     Patient location during evaluation: PACU Anesthesia Type: General Level of consciousness: awake and alert Pain management: pain level controlled Vital Signs Assessment: post-procedure vital signs reviewed and stable Respiratory status: spontaneous breathing, nonlabored ventilation, respiratory function stable and patient connected to nasal cannula oxygen Cardiovascular status: blood pressure returned to baseline and stable Postop Assessment: no apparent nausea or vomiting Anesthetic complications: no   There were no known notable events for this encounter.  Last Vitals:  Vitals:   08/02/22 1115 08/02/22 1129  BP: 111/66   Pulse: 75   Resp: 18   Temp: 37 C   SpO2: 90% 93%    Last Pain:  Vitals:   08/02/22 1129  TempSrc:   PainSc: 0-No pain                 Belenda Cruise P Omunique Pederson

## 2022-08-02 NOTE — H&P (Signed)
Electrophysiology Office Note:     Date:  08/02/2022    ID:  Shannon Yu, Shannon Yu 07/02/1956, MRN [id number]   PCP:  Lilian Coma., MD  [facility] Cardiologist:  Donato Heinz, MD  [facility] Electrophysiologist:  Vickie Epley, MD    Referring MD: Donato Heinz*    Chief Complaint: New patient consult for Afib ablation   History of Present Illness:     Shannon Yu is a 67 y.o. female who presents for an evaluation for Afib ablation at the request of Dr. Gardiner Rhyme. Their medical history includes [place] 2013, brain aneurysm s/p stent 2014 and further stenting 2015, stroke, hypertension, cervical cancer, shingles, and sleep apnea on CPAP.   During a procedure for schwannoma removal 07/20/2021 she was told that she had atrial fibrillation with rates up to 130s.   She saw Dr. Gardiner Rhyme on 11/16/2021. Zio patch x14 days on 09/18/2021 showed 3% atrial fibrillation burden, average rate 127 bpm with longest episode lasting 34 minutes. She was continued on metoprolol 25 mg BID for rate control. Discussed with Dr. Estanislado Pandy about anticoagulation given her history of SAH/brain aneurysm, in agreement that she is okay to start anticoagulation. She was started on Eliquis 5 mg twice daily and ASA was stopped. She was also interested in pursuing ablation, so she was referred to EP for further evaluation.   Today, she is accompanied by a family member. She notes she had been on 325 mg ASA prior to Eliquis. At this time she has been on Eliquis for 3-4 weeks.   She reports having 3 episodes of atrial fibrillation. Once was during a vacation and she attributed her arrhythmia to the heat. Most recently she had an episode last night that felt like "flopping", and made her feel nervous. Usually her heart rate feels fast as well. This lasted for less than an hour.   For activity she enjoys playing tennis.    During her previous SAH, she had initially experienced tunnel vision, and "lit up  triangles" in her field of vision. She returns every 2 years for a brain MRI/MRA (last 08/2021).   They deny any chest pain, shortness of breath, or peripheral edema. No lightheadedness, headaches, syncope, orthopnea, or PND.   Presents for LAAO today. Doing well after her PVI.      Objective      Past Medical History:  Diagnosis Date   Brain aneurysm 05/09/12    coil/stent, angio with stent 02/2014   Cervical cancer ([place])     Diverticulosis     Environmental allergies     History of shingles     HTN (hypertension)     Menopause     Migraine     Osteoarthritis      left knee   Personal history of colonic polyps      noted on 2012 colonoscopy   Sleep apnea 06/2011 dx    CPAP 13/apria   Stroke (cerebrum) [facility])     Vertigo             Past Surgical History:  Procedure Laterality Date   ANEURYSM COILING   05/09/2012    middle   Brain stent   2013, 2015   BREAST BIOPSY Right 1988    BENIGN   BREAST BIOPSY Left 1993    BENIGN   COLONOSCOPY   12/13/2020   IR RADIOLOGIST EVAL & MGMT   09/13/2021   KNEE ARTHROSCOPY Left     RADIOLOGY WITH  ANESTHESIA N/A 09/03/2012    Procedure: RADIOLOGY WITH ANESTHESIA;  Surgeon: Rob Hickman, MD;  Location: [place];  Service: Radiology;  Laterality: N/A;  aneurysm embolization    RADIOLOGY WITH ANESTHESIA N/A 02/10/2014    Procedure: RADIOLOGY WITH ANESTHESIA, emobolization;  Surgeon: Rob Hickman, MD;  Location: [place];  Service: Radiology;  Laterality: N/A;      Current Medications: Active Medications      Current Meds  Medication Sig   apixaban (ELIQUIS) 5 MG TABS tablet Take 1 tablet (5 mg total) by mouth 2 (two) times daily.   cetirizine (ZYRTEC) 10 MG tablet Take 10 mg by mouth daily.   Cholecalciferol 125 MCG (5000 UT) TABS Take by mouth.   hydrochlorothiazide (HYDRODIURIL) 12.5 MG tablet TAKE 1 TABLET DAILY. OFFICE VISIT REQUIRED PRIOR TO ANY FURTHER REFILLS   metoprolol tartrate (LOPRESSOR) 25 MG tablet TAKE 1 TABLET(25  MG) BY MOUTH TWICE DAILY   Omega-3 Fatty Acids (FISH OIL PO) Take 1 capsule by mouth daily.   omeprazole (PRILOSEC) 40 MG capsule TAKE 1 CAPSULE(40 MG) BY MOUTH DAILY**NEEDS APT FOR FURTHER REFILLS**   VITAMIN D, CHOLECALCIFEROL, PO Take 25 mcg by mouth daily.   zolpidem (AMBIEN) 5 MG tablet Take 1 tablet (5 mg total) by mouth at bedtime as needed for sleep.        Allergies:   Patient has no known allergies.    Social History         Socioeconomic History   Marital status: Significant Other      Spouse name: Not on file   Number of children: 1   Years of education: College degree   Highest education level: Not on file  Occupational History   Occupation: PERSONNEL      Employer: [employer]  Tobacco Use   Smoking status: Former      Packs/day: 0.25      Years: 10.00      Total pack years: 2.50      Types: Cigarettes      Start date: 54      Quit date: 07/10/2011      Years since quitting: 10.4   Smokeless tobacco: Never  Vaping Use   Vaping Use: Never used  Substance and Sexual Activity   Alcohol use: Yes      Comment: 1 glass of wine ocassionally   Drug use: No   Sexual activity: Not Currently  Other Topics Concern   Not on file  Social History Narrative    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 There is no history of Colon polyps, Colon cancer, Esophageal cancer, Stomach cancer, or Rectal cancer.   ROS:   Please see the  history of present illness.    (+) "Flopping" palpitations (+) Anxious feelings All other systems reviewed and are negative.   EKGs/Labs/Other Studies Reviewed:     The following studies were reviewed  today:   09/2021  Monitor: 3% atrial fibrillation burden, average rate 127 bpm. Longest episode lasted 34 minutes with average rate 119 bpm    Patch Wear Time:  13 days and 16 hours (2023-02-19T15:44:59-0500 to 2023-03-05T08:17:38-0500)   Patient had a min HR of 48 bpm, max HR of 186 bpm, and avg HR of 76 bpm. Predominant underlying rhythm was Sinus Rhythm. Atrial Fibrillation occurred (3% burden), ranging from 90-186 bpm (avg of 127 bpm), the longest lasting 33 mins 46 secs with an avg  rate of 119 bpm. Isolated SVEs were rare (<1.0%), SVE Couplets were rare (<1.0%), and SVE Triplets were rare (<1.0%). Isolated VEs were rare (<1.0%), and no VE Couplets or VE Triplets were present.  1 patient triggered event, corresponding to sinus rhythm   09/18/2021  Echocardiogram:  1. Left ventricular ejection fraction, by estimation, is 65 to 70%. The  left ventricle has normal function. The left ventricle has no regional  wall motion abnormalities. Left ventricular diastolic parameters are  consistent with Grade I diastolic  dysfunction (impaired relaxation).   2. Right ventricular systolic function is low normal. The right  ventricular size is normal.   3. The mitral valve is abnormal. Trivial mitral valve regurgitation.   4. The aortic valve is tricuspid. Aortic valve regurgitation is not  visualized.   Comparison(s): No prior Echocardiogram.      EKG:   EKG is personally reviewed.  01/04/2022:  EKG was not ordered.       Recent Labs: No results found for requested labs within last 365 days.    Recent Lipid Panel Labs (Brief)          Component Value Date/Time    CHOL 211 (H) 12/08/2019 0842    TRIG 71 12/08/2019 0842    HDL 64 12/08/2019 0842    CHOLHDL 3.3 12/08/2019 0842     CHOLHDL 4 06/09/2014 0818    VLDL 11.0 06/09/2014 0818    LDLCALC 134 (H) 12/08/2019 0842        Physical Exam:     VS:  BP 113/71   Pulse 66   Ht '5\' 7"'$  (1.702 m)   Wt 178 lb (80.7 kg)   SpO2 96%   BMI 27.88 kg/m         Wt Readings from Last 3 Encounters:  01/04/22 178 lb (80.7 kg)  11/16/21 178 lb 12.8 oz (81.1 kg)  10/06/21 182 lb 6.4 oz (82.7 kg)      GEN: Well nourished, well developed in no acute distress HEENT: Normal NECK: No JVD; No carotid bruits LYMPHATICS: No lymphadenopathy CARDIAC: RRR, no murmurs, rubs, gallops RESPIRATORY:  Clear to auscultation without rales, wheezing or rhonchi  ABDOMEN: Soft, non-tender, non-distended MUSCULOSKELETAL:  No edema; No deformity  SKIN: Warm and dry NEUROLOGIC:  Alert and oriented x 3 PSYCHIATRIC:  Normal affect          Assessment ASSESSMENT:     1. Paroxysmal atrial fibrillation (HCC)   2. Essential hypertension   3. SAH (subarachnoid hemorrhage) ([place])   4. OSA on CPAP     PLAN:     In order of problems listed above:   #Paroxysmal atrial fibrillation Doing well after ablation. Here for LAAO.   I have seen Shannon Yu in the office today who is being considered for a Watchman left atrial appendage closure device. I believe they will benefit from this procedure given their history of atrial fibrillation, CHA2DS2-VASc score of at least 4 (points for  HTN, stroke, gender, age in < 2 months, vascular disease given cerebral aneurysm) and unadjusted ischemic stroke rate of at least 7.2% per year. Unfortunately, the patient is not felt to be a long term anticoagulation candidate secondary to history of cerebral aneurysm and SAH. The patient's chart has been reviewed and I feel that they would be a candidate for short term oral anticoagulation after Watchman implant.    It is my belief that after undergoing a LAA closure procedure, Shannon Yu will not need long term anticoagulation which eliminates anticoagulation  side effects and major bleeding risk.    Procedural risks for the Watchman implant have been reviewed with the patient including a 0.5% risk of stroke, <1% risk of perforation and <1% risk of device embolization. Other risks include bleeding, vascular damage, tamponade, worsening renal function, and death. The patient understands these risk and wishes to proceed.       The published clinical data on the safety and effectiveness of WATCHMAN include but are not limited to the following: - Holmes DR, Mechele Claude, Sick P et al. for the PROTECT AF Investigators. Percutaneous closure of the left atrial appendage versus warfarin therapy for prevention of stroke in patients with atrial fibrillation: a randomised non-inferiority trial. Lancet 2009; 374: 534-42. Mechele Claude, Doshi SK, Abelardo Diesel D et al. on behalf of the PROTECT AF Investigators. Percutaneous Left Atrial Appendage Closure for Stroke Prophylaxis in Patients With Atrial Fibrillation 2.3-Year Follow-up of the PROTECT AF (Watchman Left Atrial Appendage System for Embolic Protection in Patients With Atrial Fibrillation) Trial. Circulation 2013; 127:720-729. - Alli O, Doshi S,  Kar S, Reddy VY, Sievert H et al. Quality of Life Assessment in the Randomized PROTECT AF (Percutaneous Closure of the Left Atrial Appendage Versus Warfarin Therapy for Prevention of Stroke in Patients With Atrial Fibrillation) Trial of Patients at Risk for Stroke With Nonvalvular Atrial Fibrillation. J Am Coll Cardiol 2013; 74:2595-6. Vertell Limber DR, Tarri Abernethy, Price M, [place], Sievert H, Doshi S, Huber K, Reddy V. Prospective randomized evaluation of the Watchman left atrial appendage Device in patients with atrial fibrillation versus long-term warfarin therapy; the PREVAIL trial. Journal of the [employer] of Cardiology, Vol. 4, No. 1, 2014, 1-11. - Kar S, Doshi SK, Sadhu A, Horton R, Osorio J et al. Primary outcome evaluation of a next-generation left atrial appendage  closure device: results from the PINNACLE FLX trial. Circulation 2021;143(18)1754-1762.      After today's visit with the patient which was dedicated solely for shared decision making visit regarding LAA closure device, the patient decided to proceed with the LAA appendage closure procedure scheduled to be done in the near future at [facility]. Prior to the procedure, I would like to obtain a gated CT scan of the chest with contrast timed for PV/LA visualization.    HAS-BLED score 3 Hypertension Yes  Abnormal renal and liver function (Dialysis, transplant, Cr >2.26 mg/dL /Cirrhosis or Bilirubin >2x Normal or AST/ALT/AP >3x Normal) No  Stroke Yes  Bleeding Yes  Labile INR (Unstable/high INR) No  Elderly (>65) No  Drugs or alcohol (? 8 drinks/week, anti-plt or NSAID) No    CHA2DS2-VASc Score = 5  The patient's score is based upon: CHF History: 0 HTN History: 1 Diabetes History: 0 Stroke History: 2 Vascular Disease History: 1 Age Score: 0 Gender Score: 1          Patient presents today for [place]. Procedure reviewed including risks and she wishes to  proceed.     Signed, Hilton Cork. Quentin Ore, MD, [facility], [facility] 08/02/2022 Electrophysiology [facility]

## 2022-08-02 NOTE — Anesthesia Procedure Notes (Signed)
Procedure Name: Intubation Date/Time: 08/02/2022 7:53 AM  Performed by: Barrington Ellison, CRNAPre-anesthesia Checklist: Patient identified, Emergency Drugs available, Suction available and Patient being monitored Patient Re-evaluated:Patient Re-evaluated prior to induction Oxygen Delivery Method: Circle System Utilized Preoxygenation: Pre-oxygenation with 100% oxygen Induction Type: IV induction Ventilation: Mask ventilation without difficulty Laryngoscope Size: Mac and 3 Grade View: Grade I Tube type: Oral Tube size: 7.0 mm Number of attempts: 1 Airway Equipment and Method: Stylet and Oral airway Placement Confirmation: ETT inserted through vocal cords under direct vision, positive ETCO2 and breath sounds checked- equal and bilateral Secured at: 22 cm Tube secured with: Tape Dental Injury: Teeth and Oropharynx as per pre-operative assessment

## 2022-08-03 ENCOUNTER — Encounter (HOSPITAL_COMMUNITY): Payer: Self-pay | Admitting: Cardiology

## 2022-08-03 ENCOUNTER — Telehealth: Payer: Self-pay | Admitting: Cardiology

## 2022-08-03 NOTE — Telephone Encounter (Signed)
  [place] TEAM   Patient contacted regarding discharge from [facility] on 08/02/22   Patient understands to follow up with provider Kathyrn Drown, NP-C on 09/10/22   Patient understands discharge instructions? Yes  Patient understands medications and regimen? Yes  Patient understands to bring all medications to this visit? Yes   Kathyrn Drown NP-C Structural Heart Team  Pager: [phone]

## 2022-08-21 ENCOUNTER — Telehealth: Payer: Self-pay | Admitting: Internal Medicine

## 2022-08-21 NOTE — Telephone Encounter (Signed)
Patient called to change her Mail order company to [employer].  Patient would like the nurse or doctor to contact [facility] and give the necessary information for all future medications.  Their # is [phone], Pt. Member # is [id number].

## 2022-08-22 ENCOUNTER — Encounter: Payer: Self-pay | Admitting: Internal Medicine

## 2022-08-22 NOTE — Telephone Encounter (Signed)
Attempted to call pt but unable to reach. Left a detailed message for pt letting her know to contact pharmacy first when she needs any meds refilled and they should then send the refill request to our office. Nothing further needed.

## 2022-08-22 NOTE — Telephone Encounter (Signed)
Mychart message sent by pt:  Shannon Yu [facility] (supporting Shannon Lever, MD)4 hours ago (12:01 PM)    Dr Annamaria Boots: I have a conundrum.  My mail order prescription benefit manager has changed.  I got one fill of zolpidem with the old company.  When I called for a refill they gave me the number for the new company.  I called the new company - my blood pressure medicine script was transferred (my blood pressure is great now so I no longer take this) but the zolpidem was not.  They told me to call your office to get a new Rx.  I called yesterday, and waited on hold forever, to tell someone in your office this information.  Today I got a call from your office (voicemail) that said I have to have the pharmacy call for any refills.  I guess the girl I talked to yesterday did not understand what I was talking about.  The new company has no Rx to request a refill.  I am attaching my new Prescription Drug Plan card.  Can you help me with this? Thank you, Shannon Yu PS The ablation and the watchman implant went smoothly!    Dr. Annamaria Boots, please advise on this for pt.  No Known Allergies   Current Outpatient Medications:    apixaban (ELIQUIS) 5 MG TABS tablet, Take 1 tablet (5 mg total) by mouth 2 (two) times daily., Disp: 180 tablet, Rfl: 3   cetirizine (ZYRTEC) 10 MG tablet, Take 10 mg by mouth 2 (two) times daily., Disp: , Rfl:    metoprolol tartrate (LOPRESSOR) 25 MG tablet, TAKE 1 TABLET(25 MG) BY MOUTH TWICE DAILY, Disp: 60 tablet, Rfl: 6   Naphazoline HCl (CLEAR EYES OP), Place 1 drop into both eyes daily., Disp: , Rfl:    Omega-3 Fatty Acids (FISH OIL) 1200 MG CAPS, Take 1,200 mg by mouth daily., Disp: , Rfl:    omeprazole (PRILOSEC) 20 MG capsule, Take 1 capsule (20 mg total) by mouth daily., Disp: , Rfl:    Vitamin D, Cholecalciferol, 25 MCG (1000 UT) TABS, Take 25 mcg by mouth daily., Disp: , Rfl:    zolpidem (AMBIEN) 5 MG tablet, Take 1 tablet (5 mg total) by mouth at  bedtime as needed for sleep., Disp: 30 tablet, Rfl: 5

## 2022-09-06 MED ORDER — ZOLPIDEM TARTRATE 5 MG PO TABS: 5.0000 mg | ORAL_TABLET | Freq: Every evening | ORAL | 1 refills | Status: DC | PRN

## 2022-09-06 NOTE — Telephone Encounter (Signed)
Patient is following up on the refill request for her Ambien. I have updated her pharmacy to [facility] as requested. Dr. Annamaria Boots, can you please advise? Thanks!

## 2022-09-06 NOTE — Telephone Encounter (Signed)
Ambien script modified for 3 month with 1 refill and sent to [facility]

## 2022-09-10 ENCOUNTER — Ambulatory Visit: Payer: Medicare Other

## 2022-09-11 NOTE — Progress Notes (Unsigned)
[facility]                                     Cardiology Office Note:    Date:  09/13/2022   ID:  Shannon, Yu July 31, 1955, MRN [id number]  PCP:  Lilian Coma., MD  [facility] Cardiologist:  Donato Heinz, MD  [facility] Electrophysiologist:  Vickie Epley, MD   Referring MD: Lilian Coma., MD   Chief Complaint  Patient presents with   Follow-up    Post LAAO    History of Present Illness:    Shannon Yu is a 67 y.o. female with a hx of brain aneurysm, SAH, PAF s/p ablation 04/30/22, cervical cancer, OSA on CPAP who underwent LAAO closure with Watchman 08/02/22 and is being seen today for follow up.    Shannon Yu was admitted in 05/2012 with subarachnoid hemorrhage. CT a showed aneurysm and she was treated with coil embolization. 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 She then required additional stenting 02/2014.    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 She saw Dr. Gardiner Rhyme on 11/16/2021. Zio patch x14 days was placed on 09/18/2021 which showed 3% atrial fibrillation burden, average rate 127 bpm with longest episode lasting 34 minutes. She has been on Eliquis for a CHADS2VASC score of 5. She underwent AF ablation with Dr Quentin Ore on 04/30/22 and was seen for Watchman on given history of intracranial bleeding.   She is now s/p successful LAAO closure with Watchman FLX 25m device. She was restarted on Eliquis '5mg'$  with plans to continue through 45 days (09/16/22) then transition to ASA '81mg'$  and Plavix '75mg'$  through 6 months (01/31/23). Repeat imaging will be performed at 8 weeks to ensure no device leak or thrombus. She will require dental SBE with amoxicillin for 6 months and should avoid dental procedures for the next 4 weeks.   Today she is here alone and reports that she has been very well with  no recent issues. She denies bleeding in stool or urine. No new neuro changes. Denies chest pain and SOB. No palpitations, dizziness, or syncope.   Past Medical History:  Diagnosis Date   Brain aneurysm 05/09/2012   coil/stent, angio with stent 02/2014   [place]    Diverticulosis    Environmental allergies    History of shingles    HTN (hypertension)    Menopause    Migraine    Osteoarthritis    left knee   Personal history of colonic polyps    noted on 2012 colonoscopy   PONV (postoperative nausea and vomiting)    Presence of Watchman left atrial appendage closure device 08/02/2022   Watchman FLX 237mplaced by Dr. LaQuentin Ore Sleep apnea 06/2011 dx   CPAP 13/apria   [place]   Vertigo     Past Surgical History:  Procedure Laterality Date   ANEURYSM COILING  05/09/2012   middle   ATRIAL FIBRILLATION ABLATION N/A 04/30/2022   Procedure: ATRIAL FIBRILLATION ABLATION;  Surgeon: LaVickie EpleyMD;  [facility];  Service: Cardiovascular;  Laterality: N/A;   Brain stent  2013, 2015   BREAST BIOPSY Right 1988   BENIGN   BREAST BIOPSY Left 1993   BENIGN  COLONOSCOPY  12/13/2020   IR RADIOLOGIST EVAL & MGMT  09/13/2021   KNEE ARTHROSCOPY Left    LEFT ATRIAL APPENDAGE OCCLUSION N/A 08/02/2022   Procedure: LEFT ATRIAL APPENDAGE OCCLUSION;  Surgeon: Vickie Epley, MD;  Location: [place] [facility];  Service: Cardiovascular;  Laterality: N/A;   RADIOLOGY WITH ANESTHESIA N/A 09/03/2012   Procedure: RADIOLOGY WITH ANESTHESIA;  Surgeon: Rob Hickman, MD;  Location: [place];  Service: Radiology;  Laterality: N/A;  aneurysm embolization    RADIOLOGY WITH ANESTHESIA N/A 02/10/2014   Procedure: RADIOLOGY WITH ANESTHESIA, emobolization;  Surgeon: Rob Hickman, MD;  Location: [place];  Service: Radiology;  Laterality: N/A;   TEE WITHOUT CARDIOVERSION N/A 08/02/2022   Procedure: TRANSESOPHAGEAL ECHOCARDIOGRAM (TEE);  Surgeon: Vickie Epley, MD;  Location: [place] [facility];  Service: Cardiovascular;  Laterality: N/A;    Current Medications: Current Meds  Medication Sig   aspirin EC 81 MG tablet Take 1 tablet (81 mg total) by mouth daily. Swallow whole. START ON 3/11 AND STOP ELIQUIS ON 3/10   cetirizine (ZYRTEC) 10 MG tablet Take 10 mg by mouth 2 (two) times daily.   clopidogrel (PLAVIX) 75 MG tablet Take 1 tablet (75 mg total) by mouth daily. START ON 3/11 AND STOP ELIQUIS ON 3/10   metoprolol tartrate (LOPRESSOR) 25 MG tablet TAKE 1 TABLET(25 MG) BY MOUTH TWICE DAILY   Naphazoline HCl (CLEAR EYES OP) Place 1 drop into both eyes daily.   Omega-3 Fatty Acids (FISH OIL) 1200 MG CAPS Take 1,200 mg by mouth daily.   omeprazole (PRILOSEC) 20 MG capsule Take 1 capsule (20 mg total) by mouth daily.   Vitamin D, Cholecalciferol, 25 MCG (1000 UT) TABS Take 25 mcg by mouth daily.   zolpidem (AMBIEN) 5 MG tablet Take 1 tablet (5 mg total) by mouth at bedtime as needed for sleep.   [DISCONTINUED] apixaban (ELIQUIS) 5 MG TABS tablet Take 1 tablet (5 mg total) by mouth 2 (two) times daily.     Allergies:   Patient has no known allergies.   Social History   Socioeconomic History   Marital status: Significant Other    Spouse name: Not on file   Number of children: 1   Years of education: College degree   Highest education level: Not on file  Occupational History   Occupation: PERSONNEL    Employer: [employer]  Tobacco Use   Smoking status: Former    Packs/day: 0.25    Years: 10.00    Total pack years: 2.50    Types: Cigarettes    Start date: 81    Quit date: 07/10/2011    Years since quitting: 11.1   Smokeless tobacco: Never  Vaping Use   Vaping Use: Never used  Substance and Sexual Activity   Alcohol use: Yes    Comment: 1 glass of wine ocassionally   Drug use: No   Sexual activity: Not Currently  Other Topics Concern   Not on file  Social History Narrative   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 There is no history of Colon polyps, Colon cancer, Esophageal cancer, Stomach cancer, or Rectal cancer.  ROS:   Please see the history of present illness.    All other systems reviewed and are negative.  EKGs/Labs/Other Studies Reviewed:    The following studies were reviewed today:  LAAO 08/02/22:  Procedures This Admission:  Transeptal Puncture Intra-procedural TEE which showed no LAA thrombus Left atrial appendage occlusive device placement on 08/02/22 by Dr. Quentin Ore.   EKG:  EKG is not ordered today.    Recent Labs: 09/12/2022: BUN 14; Creatinine, Ser 0.89; Hemoglobin 13.7; Platelets 246; Potassium 4.1; Sodium 140  Recent Lipid Panel    Component Value Date/Time   CHOL 211 (H) 12/08/2019 0842   TRIG 71 12/08/2019 0842   HDL 64 12/08/2019 0842   CHOLHDL 3.3 12/08/2019 0842   CHOLHDL 4 06/09/2014 0818   VLDL 11.0 06/09/2014 0818   LDLCALC 134 (H) 12/08/2019 0842   Physical Exam:    VS:  BP 114/66   Pulse 67   Ht '5\' 8"'$  (1.727 m)   Wt 187 lb 6.4 oz (85 kg)   SpO2 95%   BMI 28.49 kg/m     Wt Readings from Last 3 Encounters:  09/12/22 187 lb 6.4 oz (85 kg)  08/02/22 185 lb (83.9 kg)  07/25/22 188 lb 6.4 oz (85.5 kg)    General: Well developed, well nourished, NAD Lungs:Clear to ausculation bilaterally. No wheezes, rales, or rhonchi. Breathing is unlabored. Cardiovascular: RRR with S1 S2. No murmurs Extremities: No edema.  Neuro: Alert  and oriented. No focal deficits. No facial asymmetry. MAE spontaneously. Psych: Responds to questions appropriately with normal affect.    ASSESSMENT/PLAN:    PAF: s/p AF ablation with Dr Quentin Ore 04/30/22 and is now s/p successful LAAO closure with Watchman FLX 32m device. She was restarted on Eliquis '5mg'$  and has tolerated this well. She will continue through 3/10 then stop and start ASA and Plavix on 3/11. She will continue these until 01/31/23. Pre CT instructions reviewed with understanding. Obtain CBC, BMET today. She has deferred 6 months of SBE.   SAH: No further neurological changes.   Medication Adjustments/Labs and Tests Ordered: Current medicines are reviewed at length with the patient today.  Concerns regarding medicines are outlined above.  Orders Placed This Encounter  Procedures   Basic metabolic panel   CBC   Meds ordered this encounter  Medications   clopidogrel (PLAVIX) 75 MG tablet    Sig: Take 1 tablet (75 mg total) by mouth daily. START ON 3/11 AND STOP ELIQUIS ON 3/10    Dispense:  90 tablet    Refill:  3   aspirin EC 81 MG tablet    Sig: Take 1 tablet (81 mg total) by mouth daily. Swallow whole. START ON 3/11 AND STOP ELIQUIS ON 3/10    Dispense:  90 tablet    Refill:  3   apixaban (ELIQUIS) 5 MG TABS tablet    Sig: Take 1 tablet (5 mg total) by mouth 2 (two) times daily.    Dispense:  180 tablet    Refill:  3    STOP ON 3/10    Patient Instructions  Medication Instructions:  Your physician has recommended you make the following change in your medication:  STOP ELIQUIS ON 3/10 START PLAVIX 75 MG DAILY ON 3/11 START ASPIRIN  81 MG DAILY ON 3/11  *If you need a refill on your cardiac medications before your next appointment, please call your pharmacy*   Lab Work: TODAY: BMET, CBC If you have labs (blood work) drawn today and your tests are completely normal, you will receive your results only by: [place] (if you have MyChart) OR A paper copy in  the mail If you have any lab test that is abnormal or we need to change your treatment, we will call you to review the results.   Testing/Procedures: SEE INSTRUCTION LETTER    Follow-Up: At [facility], you and your health needs are our priority.  As part of our continuing mission to provide you with exceptional heart care, we have created designated Provider Care Teams.  These Care Teams include your primary Cardiologist (physician) and Advanced Practice Providers (APPs -  Physician Assistants and Nurse Practitioners) who all work together to provide you with the care you need, when you need it.  We recommend signing up for the patient portal called "MyChart".  Sign up information is provided on this After Visit Summary.  MyChart is used to connect with patients for Virtual Visits (Telemedicine).  Patients are able to view lab/test results, encounter notes, upcoming appointments, etc.  Non-urgent messages can be sent to your provider as well.   To learn more about what you can do with MyChart, go to [email or web address].    Your next appointment:   POST PROCEDURE    Signed, Kathyrn Drown, NP  09/13/2022 9:41 AM    [place]

## 2022-09-12 ENCOUNTER — Ambulatory Visit: Payer: Medicare Other | Attending: Cardiology | Admitting: Cardiology

## 2022-09-12 VITALS — BP 114/66 | HR 67 | Ht 68.0 in | Wt 187.4 lb

## 2022-09-12 DIAGNOSIS — I480 Paroxysmal atrial fibrillation: Secondary | ICD-10-CM | POA: Insufficient documentation

## 2022-09-12 DIAGNOSIS — Z95818 Presence of other cardiac implants and grafts: Secondary | ICD-10-CM | POA: Insufficient documentation

## 2022-09-12 DIAGNOSIS — I609 Nontraumatic subarachnoid hemorrhage, unspecified: Secondary | ICD-10-CM | POA: Diagnosis present

## 2022-09-12 MED ORDER — ASPIRIN 81 MG PO TBEC: 81.0000 mg | DELAYED_RELEASE_TABLET | Freq: Every day | ORAL | 3 refills | Status: AC

## 2022-09-12 MED ORDER — APIXABAN 5 MG PO TABS: 5.0000 mg | ORAL_TABLET | Freq: Two times a day (BID) | ORAL | 3 refills | Status: DC

## 2022-09-12 MED ORDER — CLOPIDOGREL BISULFATE 75 MG PO TABS: 75.0000 mg | ORAL_TABLET | Freq: Every day | ORAL | 3 refills | Status: DC

## 2022-09-12 NOTE — Patient Instructions (Signed)
Medication Instructions:  Your physician has recommended you make the following change in your medication:  STOP ELIQUIS ON 3/10 START PLAVIX 75 MG DAILY ON 3/11 START ASPIRIN 81 MG DAILY ON 3/11  *If you need a refill on your cardiac medications before your next appointment, please call your pharmacy*   Lab Work: TODAY: BMET, CBC If you have labs (blood work) drawn today and your tests are completely normal, you will receive your results only by: [place] (if you have MyChart) OR A paper copy in the mail If you have any lab test that is abnormal or we need to change your treatment, we will call you to review the results.   Testing/Procedures: SEE INSTRUCTION LETTER    Follow-Up: At [facility], you and your health needs are our priority.  As part of our continuing mission to provide you with exceptional heart care, we have created designated Provider Care Teams.  These Care Teams include your primary Cardiologist (physician) and Advanced Practice Providers (APPs -  Physician Assistants and Nurse Practitioners) who all work together to provide you with the care you need, when you need it.  We recommend signing up for the patient portal called "MyChart".  Sign up information is provided on this After Visit Summary.  MyChart is used to connect with patients for Virtual Visits (Telemedicine).  Patients are able to view lab/test results, encounter notes, upcoming appointments, etc.  Non-urgent messages can be sent to your provider as well.   To learn more about what you can do with MyChart, go to [email or web address].    Your next appointment:   POST PROCEDURE

## 2022-09-13 LAB — CBC
Hematocrit: 40.5 % (ref 34.0–46.6)
Hemoglobin: 13.7 g/dL (ref 11.1–15.9)
MCH: 32.6 pg (ref 26.6–33.0)
MCHC: 33.8 g/dL (ref 31.5–35.7)
MCV: 96 fL (ref 79–97)
Platelets: 246 10*3/uL (ref 150–450)
RBC: 4.2 x10E6/uL (ref 3.77–5.28)
RDW: 12.9 % (ref 11.7–15.4)
WBC: 6.4 10*3/uL (ref 3.4–10.8)

## 2022-09-13 LAB — BASIC METABOLIC PANEL
BUN/Creatinine Ratio: 16 (ref 12–28)
BUN: 14 mg/dL (ref 8–27)
CO2: 23 mmol/L (ref 20–29)
Calcium: 9.6 mg/dL (ref 8.7–10.3)
Chloride: 102 mmol/L (ref 96–106)
Creatinine, Ser: 0.89 mg/dL (ref 0.57–1.00)
Glucose: 93 mg/dL (ref 70–99)
Potassium: 4.1 mmol/L (ref 3.5–5.2)
Sodium: 140 mmol/L (ref 134–144)
eGFR: 72 mL/min/{1.73_m2} (ref >59)

## 2022-09-18 ENCOUNTER — Other Ambulatory Visit: Payer: Self-pay

## 2022-09-18 MED ORDER — METOPROLOL TARTRATE 25 MG PO TABS: ORAL_TABLET | ORAL | 0 refills | Status: DC

## 2022-09-28 ENCOUNTER — Telehealth (HOSPITAL_COMMUNITY): Payer: Self-pay | Admitting: Emergency Medicine

## 2022-09-28 NOTE — Telephone Encounter (Signed)
Attempted to call patient regarding upcoming cardiac CT appointment. °Left message on voicemail with name and callback number °Marquavius Scaife RN Navigator Cardiac Imaging °[facility] °[phone] Office °[phone] Cell ° °

## 2022-09-28 NOTE — Telephone Encounter (Signed)
Reaching out to patient to offer assistance regarding upcoming cardiac imaging study; pt verbalizes understanding of appt date/time, parking situation and where to check in, pre-test NPO status and medications ordered, and verified current allergies; name and call back number provided for further questions should they arise Marchia Bond RN Navigator Cardiac Imaging [facility] [phone] office [phone] cell  Arrival [address] Daily meds Denies iv issues Aware contrast

## 2022-10-01 ENCOUNTER — Ambulatory Visit (HOSPITAL_COMMUNITY)
Admission: RE | Admit: 2022-10-01 | Discharge: 2022-10-01 | Disposition: A | Discharge status: Home / Self Care | Payer: Medicare Other | Source: Ambulatory Visit | Attending: Cardiology | Admitting: Cardiology

## 2022-10-01 DIAGNOSIS — I480 Paroxysmal atrial fibrillation: Secondary | ICD-10-CM | POA: Diagnosis present

## 2022-10-01 DIAGNOSIS — Z95818 Presence of other cardiac implants and grafts: Secondary | ICD-10-CM | POA: Insufficient documentation

## 2022-10-01 MED ORDER — IOHEXOL 350 MG/ML SOLN
80.0000 mL | Freq: Once | INTRAVENOUS | Status: AC | PRN
  Administered 2022-10-01: 80 mL via INTRAVENOUS

## 2022-10-03 ENCOUNTER — Other Ambulatory Visit: Payer: Self-pay | Admitting: Cardiology

## 2022-11-05 ENCOUNTER — Other Ambulatory Visit: Payer: Self-pay | Admitting: Cardiology

## 2022-11-30 NOTE — Progress Notes (Unsigned)
Cardiology Clinic Note   Patient Name: Shannon Yu Date of Encounter: 12/04/2022  Primary Care Provider:  Malka So., MD Primary Cardiologist:  Little Ishikawa, MD  Patient Profile    Shannon Yu 67 year old female presents to the clinic today for follow-up evaluation of her essential hypertension and paroxysmal atrial fibrillation.  Past Medical History    Past Medical History:  Diagnosis Date   Brain aneurysm 05/09/2012   coil/stent, angio with stent 02/2014   Cervical cancer (HCC)    Diverticulosis    Environmental allergies    History of shingles    HTN (hypertension)    Menopause    Migraine    Osteoarthritis    left knee   Personal history of colonic polyps    noted on 2012 colonoscopy   PONV (postoperative nausea and vomiting)    Presence of Watchman left atrial appendage closure device 08/02/2022   Watchman FLX 27mm placed by Dr. Lalla Brothers   Sleep apnea 06/2011 dx   CPAP 13/apria   Stroke (cerebrum) (HCC)    Vertigo    Past Surgical History:  Procedure Laterality Date   ANEURYSM COILING  05/09/2012   middle   ATRIAL FIBRILLATION ABLATION N/A 04/30/2022   Procedure: ATRIAL FIBRILLATION ABLATION;  Surgeon: Lanier Prude, MD;  Location: [facility];  Service: Cardiovascular;  Laterality: N/A;   Brain stent  2013, 2015   BREAST BIOPSY Right 1988   BENIGN   BREAST BIOPSY Left 1993   BENIGN   COLONOSCOPY  12/13/2020   IR RADIOLOGIST EVAL & MGMT  09/13/2021   KNEE ARTHROSCOPY Left    LEFT ATRIAL APPENDAGE OCCLUSION N/A 08/02/2022   Procedure: LEFT ATRIAL APPENDAGE OCCLUSION;  Surgeon: Lanier Prude, MD;  Location: [facility];  Service: Cardiovascular;  Laterality: N/A;   RADIOLOGY WITH ANESTHESIA N/A 09/03/2012   Procedure: RADIOLOGY WITH ANESTHESIA;  Surgeon: Oneal Grout, MD;  Location: [facility] OR;  Service: Radiology;  Laterality: N/A;  aneurysm embolization    RADIOLOGY WITH ANESTHESIA N/A 02/10/2014   Procedure:  RADIOLOGY WITH ANESTHESIA, emobolization;  Surgeon: Oneal Grout, MD;  Location: [facility] OR;  Service: Radiology;  Laterality: N/A;   TEE WITHOUT CARDIOVERSION N/A 08/02/2022   Procedure: TRANSESOPHAGEAL ECHOCARDIOGRAM (TEE);  Surgeon: Lanier Prude, MD;  Location: [facility] INVASIVE CV [facility];  Service: Cardiovascular;  Laterality: N/A;    Allergies  No Known Allergies  History of Present Illness    Shannon Yu has a PMH of atrial fibrillation status post ablation 10/23, hypertension, cerebral artery vasospasm, brain aneurysm, migraine, OSA, rhinitis, subarachnoid hemorrhage, GERD, osteoarthritis, insomnia, cervical cancer, hyperlipidemia, and Watchman left atrial appendage closure device 08/02/2022.  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  She was seen in follow-up by Dr. Bjorn Pippin 11/16/2021.  Her cardiac event monitor 3/23 showed 3% A-fib burden with an average heart rate of 127 bpm with the longest episode lasting around 34 minutes.  She was placed on apixaban and her CHA2DS2-VASc score was noted to be 5.  She underwent A-fib ablation by Dr. Lalla Brothers 10/23.  She was seen for evaluation of watchman placement due to her history of intracranial bleeding.  She underwent successful left atrial appendage closure with Watchman and was restarted on apixaban 5 mg with plans to continue through 45 days (09/16/2022) then transition to aspirin and Plavix through 6 months (01/31/2023).  She does require dental SBE prophylaxis with amoxicillin for 6  months and it was recommended that she avoid dental procedures for 4 weeks post procedure.  She was seen in follow-up by Georgie Chard, NP 09/12/2022.  She denied cardiac issues.  She denied bleeding issues.  She denied neurologic changes.  It was planned for her to stop Eliquis on 09/16/2022, start Plavix 09/17/2022 and start aspirin on 09/17/2022 as well.  She presents to the clinic today for follow-up evaluation  and states she is doing well.  We reviewed her current medication regimen and Watchman device.  We reviewed her brain aneurysm.  She does note some precordial type chest discomfort that is brief and occasional.  She has been fairly physically active walking 5000-10000 steps per day.  She enjoys walking trails and playing tennis.  I will continue her current medication regimen and plan follow-up in 6 months.  We will give her a list of PCP providers.  She reports that her PCP is moving out of the area.  Today she denies chest pain, shortness of breath, lower extremity edema, fatigue, palpitations, melena, hematuria, hemoptysis, diaphoresis, weakness, presyncope, syncope, orthopnea, and PND.   Home Medications    Prior to Admission medications   Medication Sig Start Date End Date Taking? Authorizing Provider  aspirin EC 81 MG tablet Take 1 tablet (81 mg total) by mouth daily. Swallow whole. START ON 3/11 AND STOP ELIQUIS ON 3/10 09/12/22   Georgie Chard D, NP  cetirizine (ZYRTEC) 10 MG tablet Take 10 mg by mouth 2 (two) times daily.    [provider]  clopidogrel (PLAVIX) 75 MG tablet Take 1 tablet (75 mg total) by mouth daily. START ON 3/11 AND STOP ELIQUIS ON 3/10 09/12/22   Georgie Chard D, NP  metoprolol tartrate (LOPRESSOR) 25 MG tablet TAKE 1 TABLET TWICE A DAY. 11/06/22   Little Ishikawa, MD  Naphazoline HCl (CLEAR EYES OP) Place 1 drop into both eyes daily.    [provider]  Omega-3 Fatty Acids (FISH OIL) 1200 MG CAPS Take 1,200 mg by mouth daily.    [provider]  omeprazole (PRILOSEC) 20 MG capsule Take 1 capsule (20 mg total) by mouth daily. 05/28/22   Fenton, Clint R, PA  Vitamin D, Cholecalciferol, 25 MCG (1000 UT) TABS Take 25 mcg by mouth daily.    [provider]  zolpidem (AMBIEN) 5 MG tablet Take 1 tablet (5 mg total) by mouth at bedtime as needed for sleep. 09/06/22   Waymon Budge, MD    Family History    Family History  Problem  Relation Age of Onset   Emphysema Father        smoker   Lung cancer Father 51   Prostate cancer Father    Kidney cancer Father    Scleroderma Mother 78   Heart attack Maternal Grandmother    Colon polyps Neg Hx    Colon cancer Neg Hx    Esophageal cancer Neg Hx    Stomach cancer Neg Hx    Rectal cancer Neg Hx    She indicated that her mother is deceased. She indicated that her father is deceased. She indicated that her maternal grandmother is deceased. She indicated that her maternal grandfather is deceased. She indicated that her paternal grandmother is deceased. She indicated that her paternal grandfather is deceased. She indicated that the status of her neg hx is unknown.  Social History    Social History   Socioeconomic History   Marital status: Significant Other    Spouse name:  Not on file   Number of children: 1   Years of education: College degree   Highest education level: Not on file  Occupational History   Occupation: PERSONNEL    Employer: [employer]  Tobacco Use   Smoking status: Former    Packs/day: 0.25    Years: 10.00    Additional pack years: 0.00    Total pack years: 2.50    Types: Cigarettes    Start date: 62    Quit date: 07/10/2011    Years since quitting: 11.4   Smokeless tobacco: Never  Vaping Use   Vaping Use: Never used  Substance and Sexual Activity   Alcohol use: Yes    Comment: 1 glass of wine ocassionally   Drug use: No   Sexual activity: Not Currently  Other Topics Concern   Not on file  Social History Narrative   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  Cardiovascular:  No chest pain, dyspnea on exertion, edema, orthopnea, palpitations, paroxysmal nocturnal dyspnea. Dermatological: No rash, lesions/masses Respiratory: No cough, dyspnea Urologic: No hematuria, dysuria Abdominal:   No nausea, vomiting, diarrhea, bright red blood per rectum, melena, or hematemesis Neurologic:  No visual changes, wkns, changes in mental status. All other systems reviewed and are otherwise negative except as noted above.  Physical Exam    VS:  BP 124/78   Pulse 60   Ht 5\' 8"  (1.727 m)   Wt 184 lb (83.5 kg)   SpO2 96%   BMI 27.98 kg/m  , BMI Body mass index is 27.98 kg/m. GEN: Well nourished, well developed, in no acute distress. HEENT: normal. Neck: Supple, no JVD, carotid bruits, or masses. Cardiac: RRR, no murmurs, rubs, or gallops. No clubbing, cyanosis, edema.  Radials/DP/PT 2+ and equal bilaterally.  Respiratory:  Respirations regular and unlabored, clear to auscultation bilaterally. GI: Soft, nontender, nondistended, BS + x 4. MS: no deformity or atrophy. Skin: warm and dry, no rash. Neuro:  Strength and sensation are intact. Psych: Normal affect.  Accessory Clinical Findings    Recent Labs: 09/12/2022: BUN 14; Creatinine, Ser 0.89; Hemoglobin 13.7; Platelets 246; Potassium 4.1; Sodium 140   Recent Lipid Panel    Component Value Date/Time   CHOL 211 (H) 12/08/2019 0842   TRIG 71 12/08/2019 0842   HDL 64 12/08/2019 0842   CHOLHDL 3.3 12/08/2019 0842   CHOLHDL 4 06/09/2014 0818   VLDL 11.0 06/09/2014 0818   LDLCALC 134 (H) 12/08/2019 0842         ECG personally reviewed by me today- none today.   Echocardiogram 08/02/2022  Procedure: Transesophageal Echo, 3D Echo and Color Doppler   Indications:     PAF (paroxysmal atrial fibrillation) (HCC) [I48.0  (ICD-10-CM)];                  SAH (subarachnoid hemorrhage) (HCC) [I60.9 (ICD-10-CM)]    History:         Patient has prior history of Echocardiogram  examinations,  most  recent 09/18/2021. Stroke; Risk Factors:Hypertension.  Watcjamn                  Left atrial appendage closure device placed 08/02/2022.    Sonographer:     Leta Jungling RDCS  Referring Phys:  [id number] Lanier Prude  Diagnosing Phys: Thurmon Fair MD   PROCEDURE: After discussion of the risks and benefits of a TEE, an  informed consent was obtained from the patient. The transesophogeal probe  was passed without difficulty through the esophogus of the patient.  Sedation performed by different physician.  The patient was monitored while under deep sedation. Anesthestetic  sedation was provided intravenously by Anesthesiology: 140mg  of Propofol.  Image quality was good. The patient's vital signs; including heart rate,  blood pressure, and oxygen saturation;  remained stable throughout the procedure. The patient developed no  complications during the procedure.   TEE, including 3D live and postprocessed imaging, was used to establish  baseline anatomy, guide transseptal puncture, guide device deployment and  to assess the final result and screen for complications.    IMPRESSIONS     1. Left ventricular ejection fraction, by estimation, is 60 to 65%. The  left ventricle has normal function. The left ventricle has no regional  wall motion abnormalities.   2. Right ventricular systolic function is normal. The right ventricular  size is normal.   3. Left atrial size was moderately dilated. No left atrial/left atrial  appendage thrombus was detected.   4. A small pericardial effusion is present. The pericardial effusion is  localized near the right atrium and anterior to the right ventricle.   5. The mitral valve is grossly normal. No evidence of mitral valve  regurgitation. No evidence of mitral stenosis.   6. The aortic valve is tricuspid. Aortic valve regurgitation is not  visualized. No aortic stenosis is present.   7. The inferior vena  cava is normal in size with greater than 50%  respiratory variability, suggesting right atrial pressure of 3 mmHg.   8. Evidence of atrial level shunting detected by color flow Doppler.  There is a small patent foramen ovale with predominantly left to right  shunting across the atrial septum.   Comparison(s): At the end of the procedure, a well seated 27 mm Watchman  device occludes the left atrial appendage, with good device compression  and no evidence of peridevice leak. There is a tiny iatrogenic atrial  septal defect with exclusively  left-to-right shunt.   Conclusion(s)/Recommendation(s): Normal biventricular function without  evidence of hemodynamically significant valvular heart disease.  Interventional TEE for LAAO procedure. Prior to procedure, there was no  evidence of LAA thrombus. Maximal left atrial  appendage dimension was 21mm, with sufficient depth for a 27mm Watchman  FLX device. Transseptal puncture performed with no complication. Placement  of the device was optimized with no peri-device leak. Post procedure there  was a left to right shunt. No  change in pericardial effusion size before and after case.   FINDINGS   Left Ventricle: Left ventricular ejection fraction, by estimation, is 60  to 65%. The left ventricle has normal function. The left ventricle has no  regional wall motion abnormalities. The left ventricular internal cavity  size was normal in size. There is   no left ventricular hypertrophy.   Right Ventricle: The right ventricular size is normal. No increase in  right ventricular wall thickness. Right ventricular systolic function is  normal.   Left Atrium: Left atrial size was moderately  dilated. No left atrial/left  atrial appendage thrombus was detected.   Right Atrium: Right atrial size was normal in size.   Pericardium: A small pericardial effusion is present. The pericardial  effusion is localized near the right atrium and anterior to the  right  ventricle.   Mitral Valve: The mitral valve is grossly normal. No evidence of mitral  valve regurgitation. No evidence of mitral valve stenosis.   Tricuspid Valve: The tricuspid valve is grossly normal. Tricuspid valve  regurgitation is trivial. No evidence of tricuspid stenosis.   Aortic Valve: The aortic valve is tricuspid. Aortic valve regurgitation is  not visualized. No aortic stenosis is present.   Pulmonic Valve: The pulmonic valve was grossly normal. Pulmonic valve  regurgitation is trivial. No evidence of pulmonic stenosis.   Aorta: The aortic root and ascending aorta are structurally normal, with  no evidence of dilitation.   Venous: The inferior vena cava is normal in size with greater than 50%  respiratory variability, suggesting right atrial pressure of 3 mmHg.   IAS/Shunts: Evidence of atrial level shunting detected by color flow  Doppler. A small patent foramen ovale is detected with predominantly left  to right shunting across the atrial septum.   Thurmon Fair MD  Electronically signed by Thurmon Fair MD  Signature Date/Time: 08/02/2022/11:09:32 AM       Assessment & Plan   1.  Essential hypertension-BP today 124/78. Maintain blood pressure log Continue current medical therapy Heart healthy low-sodium diet  Palpitations-denies recent episodes of irregular or accelerated heart rate. Avoid triggers caffeine, chocolate, EtOH, dehydration etc.  Hyperlipidemia-LDL 134 on 12/08/2019. Continue omega-3 fatty acids High-fiber diet Follows with PCP  Atrial fibrillation-status post Watchman and atrial fibrillation ablation.  Transitioned off apixaban 09/16/2022.  Started on Plavix and aspirin 09/17/2022.  Denies bleeding issues. Follows with structural heart  Disposition: Follow-up with Dr. Bjorn Pippin in 6 months.   Thomasene Ripple. Yochanan Eddleman NP-C     12/04/2022, 3:14 PM [facility] [address] Office [phone] Fax  [phone]    I spent 14 minutes examining this patient, reviewing medications, and using patient centered shared decision making involving her cardiac care.  Prior to her visit I spent greater than 20 minutes reviewing her past medical history,  medications, and prior cardiac tests.

## 2022-12-04 ENCOUNTER — Encounter: Payer: Self-pay | Admitting: General Practice

## 2022-12-04 ENCOUNTER — Ambulatory Visit: Payer: Medicare Other | Attending: Cardiology | Admitting: General Practice

## 2022-12-04 ENCOUNTER — Ambulatory Visit: Payer: Medicare Other | Admitting: Cardiology

## 2022-12-04 VITALS — BP 124/78 | HR 60 | Ht 68.0 in | Wt 184.0 lb

## 2022-12-04 DIAGNOSIS — R002 Palpitations: Secondary | ICD-10-CM | POA: Insufficient documentation

## 2022-12-04 DIAGNOSIS — I480 Paroxysmal atrial fibrillation: Secondary | ICD-10-CM | POA: Diagnosis not present

## 2022-12-04 DIAGNOSIS — E785 Hyperlipidemia, unspecified: Secondary | ICD-10-CM | POA: Insufficient documentation

## 2022-12-04 DIAGNOSIS — I10 Essential (primary) hypertension: Secondary | ICD-10-CM | POA: Insufficient documentation

## 2022-12-04 NOTE — Patient Instructions (Signed)
Medication Instructions:  The current medical regimen is effective;  continue present plan and medications as directed. Please refer to the Current Medication list given to you today.  *If you need a refill on your cardiac medications before your next appointment, please call your pharmacy*  Lab Work: NONE If you have labs (blood work) drawn today and your tests are completely normal, you will receive your results only by: MyChart Message (if you have MyChart) OR A paper copy in the mail If you have any lab test that is abnormal or we need to change your treatment, we will call you to review the results.   Testing/Procedures: NONE   Follow-Up: At [facility], you and your health needs are our priority.  As part of our continuing mission to provide you with exceptional heart care, we have created designated Provider Care Teams.  These Care Teams include your primary Cardiologist (physician) and Advanced Practice Providers (APPs -  Physician Assistants and Nurse Practitioners) who all work together to provide you with the care you need, when you need it.  Your next appointment:   6 month(s)  Provider:   Little Ishikawa, MD    Other Instructions INCREASE PHYSICAL ACTIVITY AS TOLERATED

## 2023-01-28 ENCOUNTER — Ambulatory Visit: Payer: Medicare Other

## 2023-01-29 NOTE — Progress Notes (Signed)
[facility]                                     Cardiology Office Note:    Date:  02/01/2023   ID:  Shannon Yu, Shannon Yu May 27, 1956, MRN [id number]  PCP:  Malka So., MD  [facility] Cardiologist:  Little Ishikawa, MD  [facility] Electrophysiologist:  Lanier Prude, MD   Referring MD: Malka So., MD   Chief Complaint  Patient presents with   Follow-up    6 month s/p LAAO    History of Present Illness:    Shannon Yu is a 67 y.o. female with a hx of brain aneurysm, SAH, PAF s/p ablation 04/30/22, cervical cancer, OSA on CPAP who underwent LAAO closure with Watchman 08/02/22 and is being seen today for follow up.    Ms. Servidio was admitted in 05/2012 with subarachnoid hemorrhage. CT a showed aneurysm and she was treated with coil embolization. 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 She then required additional stenting 02/2014.    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 She saw Dr. Bjorn Pippin on 11/16/2021. Zio patch x14 days was placed on 09/18/2021 which showed 3% atrial fibrillation burden, average rate 127 bpm with longest episode lasting 34 minutes. She has been on Eliquis for a CHADS2VASC score of 5. She underwent AF ablation with Dr Lalla Brothers on 04/30/22 and was seen for Watchman on given history of intracranial bleeding.    She is now s/p successful LAAO closure with Watchman FLX 27mm device. She was restarted on Eliquis 5mg  with plans to continue through 45 days (09/16/22) then transition to ASA 81mg  and Plavix 75mg  through 6 months (01/31/23). Repeat imaging showed stable device with no leak or thrombus.    Today she is here alone and reports that she has been very well with no recent issues. Plan to stop Plavix and continue ASA 81mg  daily after clarifying with Dr. Corliss Skains given her hx. She denies  bleeding in stool or urine. No new neuro changes. Denies chest pain and SOB. No palpitations, dizziness, or syncope.   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 Lalla Brothers   Sleep apnea 06/2011 dx   CPAP 13/apria   Stroke (cerebrum) (HCC)    Vertigo     Past Surgical History:  Procedure Laterality Date   ANEURYSM COILING  05/09/2012   middle   ATRIAL FIBRILLATION ABLATION N/A 04/30/2022   Procedure: ATRIAL FIBRILLATION ABLATION;  Surgeon: Lanier Prude, MD;  Location: [facility] INVASIVE CV LAB;  Service: Cardiovascular;  Laterality: N/A;   Brain stent  2013, 2015   BREAST BIOPSY Right 1988   BENIGN   BREAST BIOPSY Left 1993   BENIGN   COLONOSCOPY  12/13/2020  IR RADIOLOGIST EVAL & MGMT  09/13/2021   KNEE ARTHROSCOPY Left    LEFT ATRIAL APPENDAGE OCCLUSION N/A 08/02/2022   Procedure: LEFT ATRIAL APPENDAGE OCCLUSION;  Surgeon: Lanier Prude, MD;  Location: [facility] INVASIVE CV LAB;  Service: Cardiovascular;  Laterality: N/A;   RADIOLOGY WITH ANESTHESIA N/A 09/03/2012   Procedure: RADIOLOGY WITH ANESTHESIA;  Surgeon: Oneal Grout, MD;  Location: [facility] OR;  Service: Radiology;  Laterality: N/A;  aneurysm embolization    RADIOLOGY WITH ANESTHESIA N/A 02/10/2014   Procedure: RADIOLOGY WITH ANESTHESIA, emobolization;  Surgeon: Oneal Grout, MD;  Location: [facility] OR;  Service: Radiology;  Laterality: N/A;   TEE WITHOUT CARDIOVERSION N/A 08/02/2022   Procedure: TRANSESOPHAGEAL ECHOCARDIOGRAM (TEE);  Surgeon: Lanier Prude, MD;  Location: [facility]  INVASIVE CV LAB;  Service: Cardiovascular;  Laterality: N/A;    Current Medications: Current Meds  Medication Sig   aspirin EC 81 MG tablet Take 1 tablet (81 mg total) by mouth daily. Swallow whole. START ON 3/11 AND STOP ELIQUIS ON 3/10   cetirizine (ZYRTEC) 10 MG tablet Take 10 mg by mouth 2 (two) times daily.   metoprolol tartrate (LOPRESSOR) 25 MG tablet TAKE 1 TABLET TWICE A DAY.   Naphazoline HCl (CLEAR EYES OP) Place 1 drop into both eyes daily.   Omega-3 Fatty Acids (FISH OIL) 1200 MG CAPS Take 1,200 mg by mouth daily.   omeprazole (PRILOSEC) 20 MG capsule Take 1 capsule (20 mg total) by mouth daily.   Vitamin D, Cholecalciferol, 25 MCG (1000 UT) TABS Take 25 mcg by mouth daily.   zolpidem (AMBIEN) 5 MG tablet Take 1 tablet (5 mg total) by mouth at bedtime as needed for sleep.   [DISCONTINUED] clopidogrel (PLAVIX) 75 MG tablet Take 1 tablet (75 mg total) by mouth daily. START ON 3/11 AND STOP ELIQUIS ON 3/10     Allergies:   Patient has no known allergies.   Social History   Socioeconomic History   Marital status: Significant Other    Spouse name: Not on file   Number of children: 1   Years of education: College degree   Highest education level: Not on file  Occupational History   Occupation: PERSONNEL    Employer: [employer]  Tobacco Use   Smoking status: Former    Current packs/day: 0.00    Average packs/day: 0.3 packs/day for 38.0 years (9.5 ttl pk-yrs)    Types: Cigarettes    Start date: 36    Quit date: 07/10/2011    Years since quitting: 11.5   Smokeless tobacco: Never  Vaping Use   Vaping status: Never Used  Substance and Sexual Activity   Alcohol use: Yes    Comment: 1 glass of wine ocassionally   Drug use: No   Sexual activity: Not Currently  Other Topics Concern   Not on file  Social History Narrative   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[employer]; 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   Right handed   Social Determinants of Health   Financial Resource Strain: Low  Risk  (10/29/2022)   Received from [facility], [facility]   Overall Financial Resource Strain (CARDIA)    Difficulty of Paying Living Expenses: Not hard at all  Food Insecurity: No Food Insecurity (10/29/2022)   Received from [facility], [facility]   Hunger Vital Sign    Worried About Running Out of Food in the Last Year: Never true    Ran Out of Food in the Last Year: Never true  Transportation  Needs: No Transportation Needs (10/29/2022)   Received from [facility], [facility]   [facility] - Transportation    Lack of Transportation (Medical): No    Lack of Transportation (Non-Medical): No  Physical Activity: Sufficiently Active (10/29/2022)   Received from [facility], [facility]   Exercise Vital Sign    Days of Exercise per Week: 5 days    Minutes of Exercise per Session: 60 min  Stress: No Stress Concern Present (10/29/2022)   Received from [facility], [facility] of Occupational Health - Occupational Stress Questionnaire    Feeling of Stress : Not at all  Social Connections: Socially Integrated (10/29/2022)   Received from [facility], [facility]   Social Network    How would you rate your social network (family, work, friends)?: Good participation with social networks    Family History: The patient's family history includes Emphysema in her father; Heart attack in her maternal grandmother; Kidney cancer in her father; Lung cancer (age of onset: 80) in her father; Prostate cancer in her father; Scleroderma (age of onset: 4) in her mother. There is no history of Colon polyps, Colon cancer, Esophageal cancer, Stomach cancer, or Rectal cancer.  ROS:   Please see the history of present illness.    All other systems reviewed and are negative.  EKGs/Labs/Other Studies Reviewed:    The following studies were reviewed today:  Cardiac Studies & Procedures       ECHOCARDIOGRAM  ECHOCARDIOGRAM COMPLETE  09/18/2021  Narrative ECHOCARDIOGRAM REPORT    Patient Name:   AREYANA LEONI Date of Exam: 09/18/2021 Medical Rec #:  [id number]     Height:       67.0 in Accession #:    [id number]    Weight:       191.8 lb Date of Birth:  03-21-1956     BSA:          1.987 m Patient Age:    65 years      BP:           145/8 mmHg Patient Gender: F             HR:           62 bpm. Exam Location:  [address]  Procedure: 2D Echo, Cardiac Doppler and Color Doppler  Indications:    I48.91* Unspecified atrial fibrillation  History:        Patient has no prior history of Echocardiogram examinations. Stroke; Risk Factors:Hypertension and Dyslipidemia. Prediabetes. Brain aneurysm. Obstructive sleep apnea. Palptations.  Sonographer:    Cathie Beams RCS Referring Phys: [id number] CHRISTOPHER L SCHUMANN  IMPRESSIONS   1. Left ventricular ejection fraction, by estimation, is 65 to 70%. The left ventricle has normal function. The left ventricle has no regional wall motion abnormalities. Left ventricular diastolic parameters are consistent with Grade I diastolic dysfunction (impaired relaxation). 2. Right ventricular systolic function is low normal. The right ventricular size is normal. 3. The mitral valve is abnormal. Trivial mitral valve regurgitation. 4. The aortic valve is tricuspid. Aortic valve regurgitation is not visualized.  Comparison(s): No prior Echocardiogram.  FINDINGS Left Ventricle: Left ventricular ejection fraction, by estimation, is 65 to 70%. The left ventricle has normal function. The left ventricle has no regional wall motion abnormalities. The left ventricular internal cavity size was normal in size. There is no left ventricular hypertrophy. Left ventricular diastolic parameters are consistent with Grade I diastolic dysfunction (impaired relaxation). Indeterminate filling pressures.  Right  Ventricle: The right ventricular size is normal. No increase in right ventricular wall  thickness. Right ventricular systolic function is low normal.  Left Atrium: Left atrial size was normal in size.  Right Atrium: Right atrial size was normal in size.  Pericardium: There is no evidence of pericardial effusion.  Mitral Valve: The mitral valve is abnormal. There is mild thickening of the anterior and posterior mitral valve leaflet(s). Trivial mitral valve regurgitation.  Tricuspid Valve: The tricuspid valve is grossly normal. Tricuspid valve regurgitation is trivial.  Aortic Valve: The aortic valve is tricuspid. Aortic valve regurgitation is not visualized.  Pulmonic Valve: The pulmonic valve was normal in structure. Pulmonic valve regurgitation is not visualized.  Aorta: The aortic root and ascending aorta are structurally normal, with no evidence of dilitation.  Venous: The inferior vena cava was not well visualized.  IAS/Shunts: No atrial level shunt detected by color flow Doppler.   LEFT VENTRICLE PLAX 2D LVIDd:         4.00 cm   Diastology LVIDs:         2.50 cm   LV e' medial:    5.85 cm/s LV PW:         1.10 cm   LV E/e' medial:  12.2 LV IVS:        1.00 cm   LV e' lateral:   6.75 cm/s LVOT diam:     1.95 cm   LV E/e' lateral: 10.6 LV SV:         71 LV SV Index:   36 LVOT Area:     2.99 cm   RIGHT VENTRICLE RV Basal diam:  2.50 cm RV S prime:     9.60 cm/s TAPSE (M-mode): 2.0 cm RVSP:           17.3 mmHg  LEFT ATRIUM             Index        RIGHT ATRIUM           Index LA diam:        3.20 cm 1.61 cm/m   RA Pressure: 3.00 mmHg LA Vol (A2C):   26.8 ml 13.49 ml/m  RA Area:     13.70 cm LA Vol (A4C):   28.6 ml 14.40 ml/m  RA Volume:   30.60 ml  15.40 ml/m LA Biplane Vol: 29.3 ml 14.75 ml/m AORTIC VALVE LVOT Vmax:   99.00 cm/s LVOT Vmean:  64.900 cm/s LVOT VTI:    0.237 m  AORTA Ao Root diam: 3.00 cm Ao Asc diam:  2.90 cm  MITRAL VALVE               TRICUSPID VALVE MV Area (PHT): 3.51 cm    TR Peak grad:   14.3 mmHg MV Decel Time: 216  msec    TR Vmax:        189.00 cm/s MV E velocity: 71.30 cm/s  Estimated RAP:  3.00 mmHg MV A velocity: 82.30 cm/s  RVSP:           17.3 mmHg MV E/A ratio:  0.87 SHUNTS Systemic VTI:  0.24 m Systemic Diam: 1.95 cm  Zoila Shutter MD Electronically signed by Zoila Shutter MD Signature Date/Time: 09/18/2021/5:34:13 PM    Final   TEE  ECHO TEE 08/02/2022  Narrative TRANSESOPHOGEAL ECHO REPORT    Patient Name:   ZIVAH MAYR Date of Exam: 08/02/2022 Medical Rec #:  [id number]     Height:  68.0 in Accession #:    [id number]    Weight:       185.0 lb Date of Birth:  08-25-1955     BSA:          1.977 m Patient Age:    66 years      BP:           113/71 mmHg Patient Gender: F             HR:           74 bpm. Exam Location:  Inpatient  Procedure: Transesophageal Echo, 3D Echo and Color Doppler  Indications:     PAF (paroxysmal atrial fibrillation) (HCC) [I48.0 (ICD-10-CM)]; SAH (subarachnoid hemorrhage) (HCC) [I60.9 (ICD-10-CM)]  History:         Patient has prior history of Echocardiogram examinations, most recent 09/18/2021. Stroke; Risk Factors:Hypertension. Watcjamn Left atrial appendage closure device placed 08/02/2022.  Sonographer:     Leta Jungling RDCS Referring Phys:  [id number] Lanier Prude Diagnosing Phys: Thurmon Fair MD  PROCEDURE: After discussion of the risks and benefits of a TEE, an informed consent was obtained from the patient. The transesophogeal probe was passed without difficulty through the esophogus of the patient. Sedation performed by different physician. The patient was monitored while under deep sedation. Anesthestetic sedation was provided intravenously by Anesthesiology: 140mg  of Propofol. Image quality was good. The patient's vital signs; including heart rate, blood pressure, and oxygen saturation; remained stable throughout the procedure. The patient developed no complications during the procedure.  TEE, including 3D live and  postprocessed imaging, was used to establish baseline anatomy, guide transseptal puncture, guide device deployment and to assess the final result and screen for complications.  IMPRESSIONS   1. Left ventricular ejection fraction, by estimation, is 60 to 65%. The left ventricle has normal function. The left ventricle has no regional wall motion abnormalities. 2. Right ventricular systolic function is normal. The right ventricular size is normal. 3. Left atrial size was moderately dilated. No left atrial/left atrial appendage thrombus was detected. 4. A small pericardial effusion is present. The pericardial effusion is localized near the right atrium and anterior to the right ventricle. 5. The mitral valve is grossly normal. No evidence of mitral valve regurgitation. No evidence of mitral stenosis. 6. The aortic valve is tricuspid. Aortic valve regurgitation is not visualized. No aortic stenosis is present. 7. The inferior vena cava is normal in size with greater than 50% respiratory variability, suggesting right atrial pressure of 3 mmHg. 8. Evidence of atrial level shunting detected by color flow Doppler. There is a small patent foramen ovale with predominantly left to right shunting across the atrial septum.  Comparison(s): At the end of the procedure, a well seated 27 mm Watchman device occludes the left atrial appendage, with good device compression and no evidence of peridevice leak. There is a tiny iatrogenic atrial septal defect with exclusively left-to-right shunt.  Conclusion(s)/Recommendation(s): Normal biventricular function without evidence of hemodynamically significant valvular heart disease. Interventional TEE for LAAO procedure. Prior to procedure, there was no evidence of LAA thrombus. Maximal left atrial appendage dimension was 21mm, with sufficient depth for a 27mm Watchman FLX device. Transseptal puncture performed with no complication. Placement of the device was optimized with  no peri-device leak. Post procedure there was a left to right shunt. No change in pericardial effusion size before and after case.  FINDINGS Left Ventricle: Left ventricular ejection fraction, by estimation, is 60 to 65%. The left ventricle has normal  function. The left ventricle has no regional wall motion abnormalities. The left ventricular internal cavity size was normal in size. There is no left ventricular hypertrophy.  Right Ventricle: The right ventricular size is normal. No increase in right ventricular wall thickness. Right ventricular systolic function is normal.  Left Atrium: Left atrial size was moderately dilated. No left atrial/left atrial appendage thrombus was detected.  Right Atrium: Right atrial size was normal in size.  Pericardium: A small pericardial effusion is present. The pericardial effusion is localized near the right atrium and anterior to the right ventricle.  Mitral Valve: The mitral valve is grossly normal. No evidence of mitral valve regurgitation. No evidence of mitral valve stenosis.  Tricuspid Valve: The tricuspid valve is grossly normal. Tricuspid valve regurgitation is trivial. No evidence of tricuspid stenosis.  Aortic Valve: The aortic valve is tricuspid. Aortic valve regurgitation is not visualized. No aortic stenosis is present.  Pulmonic Valve: The pulmonic valve was grossly normal. Pulmonic valve regurgitation is trivial. No evidence of pulmonic stenosis.  Aorta: The aortic root and ascending aorta are structurally normal, with no evidence of dilitation.  Venous: The inferior vena cava is normal in size with greater than 50% respiratory variability, suggesting right atrial pressure of 3 mmHg.  IAS/Shunts: Evidence of atrial level shunting detected by color flow Doppler. A small patent foramen ovale is detected with predominantly left to right shunting across the atrial septum.  Rachelle Hora Croitoru MD Electronically signed by Thurmon Fair  MD Signature Date/Time: 08/02/2022/11:09:32 AM    Final   MONITORS  LONG TERM MONITOR (3-14 DAYS) 09/18/2021  Narrative  3% atrial fibrillation burden, average rate 127 bpm. Longest episode lasted 34 minutes with average rate 119 bpm   Patch Wear Time:  13 days and 16 hours (2023-02-19T15:44:59-0500 to 2023-03-05T08:17:38-0500)  Patient had a min HR of 48 bpm, max HR of 186 bpm, and avg HR of 76 bpm. Predominant underlying rhythm was Sinus Rhythm. Atrial Fibrillation occurred (3% burden), ranging from 90-186 bpm (avg of 127 bpm), the longest lasting 33 mins 46 secs with an avg rate of 119 bpm. Isolated SVEs were rare (<1.0%), SVE Couplets were rare (<1.0%), and SVE Triplets were rare (<1.0%). Isolated VEs were rare (<1.0%), and no VE Couplets or VE Triplets were present.  1 patient triggered event, corresponding to sinus rhythm           EKG:  EKG is not ordered today.    Recent Labs: 09/12/2022: BUN 14; Creatinine, Ser 0.89; Hemoglobin 13.7; Platelets 246; Potassium 4.1; Sodium 140   Recent Lipid Panel    Component Value Date/Time   CHOL 211 (H) 12/08/2019 0842   TRIG 71 12/08/2019 0842   HDL 64 12/08/2019 0842   CHOLHDL 3.3 12/08/2019 0842   CHOLHDL 4 06/09/2014 0818   VLDL 11.0 06/09/2014 0818   LDLCALC 134 (H) 12/08/2019 0842   Physical Exam:    VS:  BP 110/70   Pulse 60   Ht 5\' 8"  (1.727 m)   Wt 183 lb 12.8 oz (83.4 kg)   SpO2 97%   BMI 27.95 kg/m     Wt Readings from Last 3 Encounters:  01/30/23 183 lb 12.8 oz (83.4 kg)  12/04/22 184 lb (83.5 kg)  09/12/22 187 lb 6.4 oz (85 kg)    General: Well developed, well nourished, NAD Lungs:Clear to ausculation bilaterally. No wheezes, rales, or rhonchi. Breathing is unlabored. Cardiovascular: RRR with S1 S2. No murmurs Extremities: No edema. Neuro: Alert and oriented. No focal  deficits. No facial asymmetry. MAE spontaneously. Psych: Responds to questions appropriately with normal affect.    ASSESSMENT/PLAN:      PAF: s/p AF ablation with Dr Lalla Brothers 04/30/22 and is now s/p successful LAAO closure with Watchman FLX 27mm device. Plan to stop Plavix and continue ASA 81mg  indefinitely per Dr. Corliss Skains. No need for dental SBE any longer. Will plan to touch base at the one year mark.    SAH: No further neurological changes. Continue ASA 81mg  daily.     Medication Adjustments/Labs and Tests Ordered: Current medicines are reviewed at length with the patient today.  Concerns regarding medicines are outlined above.  No orders of the defined types were placed in this encounter.  No orders of the defined types were placed in this encounter.   Patient Instructions  Medication Instructions:  Your physician has recommended you make the following change in your medication:  STOP PLAVIX  CONTINUE ASPIRIN    *If you need a refill on your cardiac medications before your next appointment, please call your pharmacy*   Lab Work: NONE If you have labs (blood work) drawn today and your tests are completely normal, you will receive your results only by: MyChart Message (if you have MyChart) OR A paper copy in the mail If you have any lab test that is abnormal or we need to change your treatment, we will call you to review the results.   Testing/Procedures: NONE   Follow-Up: At [facility], you and your health needs are our priority.  As part of our continuing mission to provide you with exceptional heart care, we have created designated Provider Care Teams.  These Care Teams include your primary Cardiologist (physician) and Advanced Practice Providers (APPs -  Physician Assistants and Nurse Practitioners) who all work together to provide you with the care you need, when you need it.  We recommend signing up for the patient portal called "MyChart".  Sign up information is provided on this After Visit Summary.  MyChart is used to connect with patients for Virtual Visits (Telemedicine).  Patients are able  to view lab/test results, encounter notes, upcoming appointments, etc.  Non-urgent messages can be sent to your provider as well.   To learn more about what you can do with MyChart, go to [email or web address].    Your next appointment:   SOMEONE WILL CALL YOU FOR YOUR 1 YEAR FOLLOW-UP APPOINTMENT    Signed, Georgie Chard, NP  02/01/2023 12:04 PM    [facility]

## 2023-01-30 ENCOUNTER — Ambulatory Visit: Payer: Medicare Other | Admitting: Cardiology

## 2023-01-30 VITALS — BP 110/70 | HR 60 | Ht 68.0 in | Wt 183.8 lb

## 2023-01-30 DIAGNOSIS — I480 Paroxysmal atrial fibrillation: Secondary | ICD-10-CM | POA: Insufficient documentation

## 2023-01-30 DIAGNOSIS — I609 Nontraumatic subarachnoid hemorrhage, unspecified: Secondary | ICD-10-CM | POA: Diagnosis not present

## 2023-01-30 DIAGNOSIS — D6869 Other thrombophilia: Secondary | ICD-10-CM | POA: Diagnosis not present

## 2023-01-30 DIAGNOSIS — Z95818 Presence of other cardiac implants and grafts: Secondary | ICD-10-CM | POA: Insufficient documentation

## 2023-01-30 NOTE — Patient Instructions (Signed)
Medication Instructions:  Your physician has recommended you make the following change in your medication:  STOP PLAVIX  CONTINUE ASPIRIN    *If you need a refill on your cardiac medications before your next appointment, please call your pharmacy*   Lab Work: NONE If you have labs (blood work) drawn today and your tests are completely normal, you will receive your results only by: MyChart Message (if you have MyChart) OR A paper copy in the mail If you have any lab test that is abnormal or we need to change your treatment, we will call you to review the results.   Testing/Procedures: NONE   Follow-Up: At [facility], you and your health needs are our priority.  As part of our continuing mission to provide you with exceptional heart care, we have created designated Provider Care Teams.  These Care Teams include your primary Cardiologist (physician) and Advanced Practice Providers (APPs -  Physician Assistants and Nurse Practitioners) who all work together to provide you with the care you need, when you need it.  We recommend signing up for the patient portal called "MyChart".  Sign up information is provided on this After Visit Summary.  MyChart is used to connect with patients for Virtual Visits (Telemedicine).  Patients are able to view lab/test results, encounter notes, upcoming appointments, etc.  Non-urgent messages can be sent to your provider as well.   To learn more about what you can do with MyChart, go to [email or web address].    Your next appointment:   SOMEONE WILL CALL YOU FOR YOUR 1 YEAR FOLLOW-UP APPOINTMENT

## 2023-03-03 NOTE — Progress Notes (Unsigned)
HPI female former smoker followed for OSA, allergic rhinitis, complicated by brain aneurysms/SAH NPSG 07/06/11- Mild OSA AHI 7.8/ hr, nonpositional, loud snoring, desat to 83%.   ----------------------------------------------------------------------------------. .  03/01/22- 67 year old female former smoker (2.5 pk yrs)followed for OSA, allergic rhinitis, complicated by brain aneurysms/SAH/stent 2013 , Covid infection March 2022, AFib, CPAP auto 10-15/[facility]      AirSense 10 AutoSet Download-compliance   100%, AHI 1.7/ hr    Body weight today-178 lbs Covid vax-3 Phizer AFib pending ablation/ Watchman Dr Lalla Brothers because of her history of intracerebral bleed. Download reviewed.  Comfortable with CPAP which helps her sleep. Rhinorrhea has been more bothersome earlier this summer and she is back on Zyrtec twice daily.  Options reviewed. Asks about having something available to help intermittent insomnia.  Sleep habits reviewed.  03/04/23- 67 year old female former smoker (2.5 pk yrs)followed for OSA, allergic rhinitis, complicated by brain aneurysms/SAH/stent 2013 , Covid infection March 2022, AFib/ ablation/Watchman,, -Remus Loffler 5, CPAP auto 10-15/[facility]      AirSense 10 AutoSet Download-compliance   93%, AHI 2.6/hr Body weight today-190 lbs -----Pt states she doesn't sleep well, currently on Ambien but not really helping much Doing fine with CPAP- download reviewed. Ambien helps sleep onset, but for the past year she has had trouble maintaining sleep- unclear why. Discussed trial of Lunesta as alternative. Cardiac ablation and Watchman went well. CXR 08/02/22- IMPRESSION: No active cardiopulmonary disease.  ROS-see HPI + = positive Constitutional:   No-   weight loss, night sweats, fevers, chills, fatigue, lassitude. HEENT:   No-  headaches, difficulty swallowing, tooth/dental problems, sore throat,       Some  sneezing, itching, ear ache, nasal congestion, post nasal drip,  CV:  No-   chest  pain, orthopnea, PND, swelling in lower extremities, anasarca, dizziness, palpitations Resp: No-   shortness of breath with exertion or at rest.              No-   productive cough,  No non-productive cough,  No- coughing up of blood,   change in color of mucus.  No- wheezing.   Skin: No-   rash or lesions. GI:  No-   heartburn, indigestion, abdominal pain, nausea, vomiting,  GU: MS: Normal apparent strength and muscle bulk Neuro-     nothing unusual Psych:  No- change in mood or affect. No depression or anxiety.  No memory loss.  OBJ           General- Alert, Oriented, Affect-appropriate/ friendly, Distress- none acute, tall Skin- rash-none, lesions- none, excoriation- none Lymphadenopathy- none Head- atraumatic            Eyes- Gross vision intact, PERRLA, conjunctivae clear secretions            Ears- Hearing, canals-normal            Nose- Clear, no-Septal dev, mucus, polyps, erosion, perforation             Throat- Mallampati II-III , mucosa clear , drainage- none, tonsils- atrophic Neck- flexible , trachea midline, no stridor , thyroid nl, carotid no bruit Chest - symmetrical excursion , unlabored           Heart/CV- RRR today , no murmur , no gallop  , no rub, nl s1 s2                           - JVD- none , edema- none, stasis changes- none, varices- none  Lung- clear to P&A, wheeze- none, cough- none , dullness-none, rub- none           Chest wall-  Abd- Br/ Gen/ Rectal- Not done, not indicated Extrem- cyanosis- none, clubbing, none, atrophy- none, strength- nl Neuro- grossly intact to observation

## 2023-03-04 ENCOUNTER — Encounter: Payer: Self-pay | Admitting: Internal Medicine

## 2023-03-04 ENCOUNTER — Ambulatory Visit (INDEPENDENT_AMBULATORY_CARE_PROVIDER_SITE_OTHER): Payer: Medicare Other | Admitting: Internal Medicine

## 2023-03-04 VITALS — BP 128/78 | HR 73 | Ht 68.0 in | Wt 190.8 lb

## 2023-03-04 DIAGNOSIS — G4733 Obstructive sleep apnea (adult) (pediatric): Secondary | ICD-10-CM | POA: Diagnosis not present

## 2023-03-04 DIAGNOSIS — F5101 Primary insomnia: Secondary | ICD-10-CM

## 2023-03-04 DIAGNOSIS — I480 Paroxysmal atrial fibrillation: Secondary | ICD-10-CM | POA: Diagnosis not present

## 2023-03-04 MED ORDER — ESZOPICLONE 3 MG PO TABS: 3.0000 mg | ORAL_TABLET | Freq: Every day | ORAL | 3 refills | Status: DC

## 2023-03-04 NOTE — Patient Instructions (Signed)
Script sent to try Lunesta 3 mg at bedtime  We can continue CPAP auto 10-15  Please call if we can help

## 2023-03-04 NOTE — Assessment & Plan Note (Signed)
Ablation and Watchman by Cardiology

## 2023-03-04 NOTE — Assessment & Plan Note (Signed)
Ambien not maintaining sleep.  Pan- try Lunesta- discussed

## 2023-03-04 NOTE — Assessment & Plan Note (Signed)
Benefits from CPAP Plan- continue auto 10-15

## 2023-03-05 MED ORDER — TRAZODONE HCL 50 MG PO TABS: ORAL_TABLET | ORAL | 5 refills | Status: DC

## 2023-03-05 NOTE — Telephone Encounter (Signed)
Ambien dose is automatically limited by computer to 5 mg for females. I have sent script to try trazodone 50 mg. If not enough after a couple of nights, try 2 .

## 2023-03-12 ENCOUNTER — Encounter: Payer: Self-pay | Admitting: Internal Medicine

## 2023-05-29 NOTE — Progress Notes (Signed)
Cardiology Clinic Note   Patient Name: Shannon Yu Date of Encounter: 06/03/2023  Primary Care Provider:  Malka So., MD Primary Cardiologist:  Little Ishikawa, MD  Patient Profile    Shannon Yu 67 year old female presents to the clinic today for follow-up evaluation of her essential hypertension and paroxysmal atrial fibrillation.  Past Medical History    Past Medical History:  Diagnosis Date   Brain aneurysm 05/09/2012   coil/stent, angio with stent 02/2014   Cervical cancer (HCC)    Diverticulosis    Environmental allergies    History of shingles    HTN (hypertension)    Menopause    Migraine    Osteoarthritis    left knee   Personal history of colonic polyps    noted on 2012 colonoscopy   PONV (postoperative nausea and vomiting)    Presence of Watchman left atrial appendage closure device 08/02/2022   Watchman FLX 27mm placed by Dr. Lalla Brothers   Sleep apnea 06/2011 dx   CPAP 13/[facility]   Stroke (cerebrum) (HCC)    Vertigo    Past Surgical History:  Procedure Laterality Date   ANEURYSM COILING  05/09/2012   middle   ATRIAL FIBRILLATION ABLATION N/A 04/30/2022   Procedure: ATRIAL FIBRILLATION ABLATION;  Surgeon: Lanier Prude, MD;  Location: [facility];  Service: Cardiovascular;  Laterality: N/A;   Brain stent  2013, 2015   BREAST BIOPSY Right 1988   BENIGN   BREAST BIOPSY Left 1993   BENIGN   COLONOSCOPY  12/13/2020   IR RADIOLOGIST EVAL & MGMT  09/13/2021   KNEE ARTHROSCOPY Left    LEFT ATRIAL APPENDAGE OCCLUSION N/A 08/02/2022   Procedure: LEFT ATRIAL APPENDAGE OCCLUSION;  Surgeon: Lanier Prude, MD;  Location: [facility];  Service: Cardiovascular;  Laterality: N/A;   RADIOLOGY WITH ANESTHESIA N/A 09/03/2012   Procedure: RADIOLOGY WITH ANESTHESIA;  Surgeon: Oneal Grout, MD;  Location: [facility] OR;  Service: Radiology;  Laterality: N/A;  aneurysm embolization    RADIOLOGY WITH ANESTHESIA N/A 02/10/2014   Procedure:  RADIOLOGY WITH ANESTHESIA, emobolization;  Surgeon: Oneal Grout, MD;  Location: [facility] OR;  Service: Radiology;  Laterality: N/A;   TEE WITHOUT CARDIOVERSION N/A 08/02/2022   Procedure: TRANSESOPHAGEAL ECHOCARDIOGRAM (TEE);  Surgeon: Lanier Prude, MD;  Location: [facility]AS[facility] CV LAB;  Service: Cardiovascular;  Laterality: N/A;    Allergies  No Known Allergies  History of Present Illness    Shannon Yu has a PMH of atrial fibrillation status post ablation 10/23, hypertension, cerebral artery vasospasm, brain aneurysm, migraine, OSA, rhinitis, subarachnoid hemorrhage, GERD, osteoarthritis, insomnia, cervical cancer, hyperlipidemia, and Watchman left atrial appendage closure device 08/02/2022.  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  She was seen in follow-up by Dr. Bjorn Pippin 11/16/2021.  Her cardiac event monitor 3/23 showed 3% A-fib burden with an average heart rate of 127 bpm with the longest episode lasting around 34 minutes.  She was placed on apixaban and her CHA2DS2-VASc score was noted to be 5.  She underwent A-fib ablation by Dr. Lalla Brothers 10/23.  She was seen for evaluation of watchman placement due to her history of intracranial bleeding.  She underwent successful left atrial appendage closure with Watchman and was restarted on apixaban 5 mg with plans to continue through 45 days (09/16/2022) then transition to aspirin and Plavix through 6 months (01/31/2023).  She does require dental SBE prophylaxis with amoxicillin for 6  months and it was recommended that she avoid dental procedures for 4 weeks post procedure.  She was seen in follow-up by Georgie Chard, NP 09/12/2022.  She denied cardiac issues.  She denied bleeding issues.  She denied neurologic changes.  It was planned for her to stop Eliquis on 09/16/2022, start Plavix 09/17/2022 and start aspirin on 09/17/2022 as well.  She presented to the clinic 12/04/22 for follow-up evaluation  and stated she was doing well.  We reviewed her medication regimen and Watchman device.  We reviewed her brain aneurysm.  She did note some precordial type chest discomfort that was brief and occasional.  She had been fairly physically active walking 5000-10000 steps per day.  She enjoyed walking trails and playing tennis.  I  continued her medication regimen and planned follow-up in 6 months.  I gave her a list of PCP providers.  She reported that her PCP was moving out of the area.  She followed up with Georgie Chard on 01/30/2023.  She continues to do well.  She had no complaints.  A plan was made to stop her Plavix and continue aspirin daily after clarifying with Dr. Corliss Skains.  She denied bleeding issues.  She denied chest pain shortness of breath.  No palpitations dizziness or syncope were noted.  She presents to the clinic today for follow-up evaluation and states she continues to be very physically active playing tennis and walking.  She has been also doing yoga 5-6 times per week.  She does notice occasional brief episodes of sharp chest discomfort.  She attributes this to muscle pain.  We reviewed her previous structural heart visit.  She expressed understanding.  She does have some left greater than right lower extremity swelling.  She attributes this to her left knee.  She is seeing orthopedics for this.  She continues to follow a heart healthy low-sodium diet.  Her significant other has alpha gal so they only eat chicken and fish.  I will continue her current medication regimen and plan follow-up in 12 months..  Today she denies chest pain, shortness of breath, lower extremity edema, fatigue, palpitations, melena, hematuria, hemoptysis, diaphoresis, weakness, presyncope, syncope, orthopnea, and PND.   Home Medications    Prior to Admission medications   Medication Sig Start Date End Date Taking? Authorizing Provider  aspirin EC 81 MG tablet Take 1 tablet (81 mg total) by mouth daily. Swallow  whole. START ON 3/11 AND STOP ELIQUIS ON 3/10 09/12/22   Georgie Chard D, NP  cetirizine (ZYRTEC) 10 MG tablet Take 10 mg by mouth 2 (two) times daily.    [provider]  clopidogrel (PLAVIX) 75 MG tablet Take 1 tablet (75 mg total) by mouth daily. START ON 3/11 AND STOP ELIQUIS ON 3/10 09/12/22   Georgie Chard D, NP  metoprolol tartrate (LOPRESSOR) 25 MG tablet TAKE 1 TABLET TWICE A DAY. 11/06/22   Little Ishikawa, MD  Naphazoline HCl (CLEAR EYES OP) Place 1 drop into both eyes daily.    [provider]  Omega-3 Fatty Acids (FISH OIL) 1200 MG CAPS Take 1,200 mg by mouth daily.    [provider]  omeprazole (PRILOSEC) 20 MG capsule Take 1 capsule (20 mg total) by mouth daily. 05/28/22   Fenton, Clint R, PA  Vitamin D, Cholecalciferol, 25 MCG (1000 UT) TABS Take 25 mcg by mouth daily.    [provider]  zolpidem (AMBIEN) 5 MG tablet Take 1 tablet (5 mg total) by mouth at bedtime as  needed for sleep. 09/06/22   Waymon Budge, MD    Family History    Family History  Problem Relation Age of Onset   Emphysema Father        smoker   Lung cancer Father 91   Prostate cancer Father    Kidney cancer Father    Scleroderma Mother 44   Heart attack Maternal Grandmother    Colon polyps Neg Hx    Colon cancer Neg Hx    Esophageal cancer Neg Hx    Stomach cancer Neg Hx    Rectal cancer Neg Hx    She indicated that her mother is deceased. She indicated that her father is deceased. She indicated that her maternal grandmother is deceased. She indicated that her maternal grandfather is deceased. She indicated that her paternal grandmother is deceased. She indicated that her paternal grandfather is deceased. She indicated that the status of her neg hx is unknown.  Social History    Social History   Socioeconomic History   Marital status: Significant Other    Spouse name: Not on file   Number of children: 1   Years of education: College degree   Highest  education level: Not on file  Occupational History   Occupation: PERSONNEL    Employer: [employer]  Tobacco Use   Smoking status: Former    Current packs/day: 0.00    Average packs/day: 0.3 packs/day for 38.0 years (9.5 ttl pk-yrs)    Types: Cigarettes    Start date: 39    Quit date: 07/10/2011    Years since quitting: 11.9   Smokeless tobacco: Never  Vaping Use   Vaping status: Never Used  Substance and Sexual Activity   Alcohol use: Yes    Comment: 1 glass of wine ocassionally   Drug use: No   Sexual activity: Not Currently  Other Topics Concern   Not on file  Social History Narrative   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[employer]; 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   Right handed   Social Determinants of Health   Financial Resource Strain: Low Risk  (10/29/2022)   Received from [facility], [facility]   Overall Financial Resource Strain (CARDIA)    Difficulty of Paying Living Expenses: Not hard at all  Food Insecurity: No Food Insecurity (10/29/2022)   Received from [facility], [facility]   Hunger Vital Sign    Worried About Running Out of Food in the Last Year: Never true    Ran Out of Food in the Last Year: Never true  Transportation Needs: No Transportation Needs (10/29/2022)   Received from [facility], [facility]   PRAPARE - Transportation    Lack of Transportation (Medical): No    Lack of Transportation (Non-Medical): No  Physical Activity: Sufficiently Active (10/29/2022)   Received from [facility], [facility]   Exercise Vital Sign    Days of Exercise per Week: 5 days    Minutes of Exercise per Session: 60 min  Stress: No Stress Concern Present (10/29/2022)   Received from [facility], [facility] - Occupational Stress Questionnaire    Feeling of Stress : Not at all  Social Connections: Socially Integrated (10/29/2022)   [facility], [facility]   Social Network    How would you rate your  social network (family, work, friends)?: Good participation with social networks  Intimate Partner Violence: Not At Risk (10/29/2022)   [facility]   HITS    Over the last 12 months how often did your partner physically hurt you?: Never    Over the last 12 months how often did your partner insult you or talk down to you?: Never    Over the last 12 months how often did your partner threaten you with physical harm?: Never    Over the last 12 months how often did your partner scream or curse at you?: Never     Review of Systems    General:  No chills, fever, night sweats or weight changes.  Cardiovascular:  No chest pain, dyspnea on exertion, edema, orthopnea, palpitations, paroxysmal nocturnal dyspnea. Dermatological: No rash, lesions/masses Respiratory: No cough, dyspnea Urologic: No hematuria, dysuria Abdominal:   No nausea, vomiting, diarrhea, bright red blood per rectum, melena, or hematemesis Neurologic:  No visual changes, wkns, changes in mental status. All other systems reviewed and are otherwise negative except as noted above.  Physical Exam    VS:  BP 120/62 (BP Location: Left Arm, Patient Position: Sitting, Cuff Size: Normal)   Pulse (!) 57   Ht 5\' 8"  (1.727 m)   Wt 190 lb (86.2 kg)   SpO2 91%   BMI 28.89 kg/m  , BMI Body mass index is 28.89 kg/m. GEN: Well nourished, well developed, in no acute distress. HEENT: normal. Neck: Supple, no JVD, carotid bruits, or masses. Cardiac: RRR, no murmurs, rubs, or gallops. No clubbing, cyanosis, edema.  Radials/DP/PT 2+ and equal bilaterally.  Respiratory:  Respirations regular and unlabored, clear to auscultation bilaterally. GI: Soft, nontender, nondistended, BS + x 4. MS: no deformity or atrophy. Skin: warm and dry, no rash. Neuro:  Strength and sensation are intact. Psych: Normal affect.  Accessory Clinical Findings    Recent Labs: 09/12/2022: BUN 14; Creatinine, Ser 0.89; Hemoglobin 13.7;  Platelets 246; Potassium 4.1; Sodium 140   Recent Lipid Panel    Component Value Date/Time   CHOL 211 (H) 12/08/2019 0842   TRIG 71 12/08/2019 0842   HDL 64 12/08/2019 0842   CHOLHDL 3.3 12/08/2019 0842   CHOLHDL 4 06/09/2014 0818   VLDL 11.0 06/09/2014 0818   LDLCALC 134 (H) 12/08/2019 0842         ECG personally reviewed by me today-EKG Interpretation Date/Time:  Monday June 03 2023 13:33:13 EST Ventricular Rate:  57 PR Interval:  152 QRS Duration:  80 QT Interval:  406 QTC Calculation: 395 R Axis:   47  Text Interpretation: Sinus bradycardia When compared with ECG of 02-Aug-2022 09:36, No significant change was found Confirmed by Edd Fabian [phone]) on 06/03/2023 1:57:37 PM    Echocardiogram 08/02/2022  Procedure: Transesophageal Echo, 3D Echo and Color Doppler   Indications:     PAF (paroxysmal atrial fibrillation) (HCC) [I48.0  (ICD-10-CM)];                  SAH (subarachnoid hemorrhage) (HCC) [I60.9 (ICD-10-CM)]    History:         Patient has prior history of Echocardiogram examinations,  most                  recent 09/18/2021. Stroke; Risk Factors:Hypertension.  Watcjamn                  Left atrial appendage closure device placed 08/02/2022.    Sonographer:     Leta Jungling RDCS  Referring Phys:  [id number] Lanier Prude  Diagnosing Phys: Thurmon Fair MD   PROCEDURE: After discussion  of the risks and benefits of a TEE, an  informed consent was obtained from the patient. The transesophogeal probe  was passed without difficulty through the esophogus of the patient.  Sedation performed by different physician.  The patient was monitored while under deep sedation. Anesthestetic  sedation was provided intravenously by Anesthesiology: 140mg  of Propofol.  Image quality was good. The patient's vital signs; including heart rate,  blood pressure, and oxygen saturation;  remained stable throughout the procedure. The patient developed no  complications  during the procedure.   TEE, including 3D live and postprocessed imaging, was used to establish  baseline anatomy, guide transseptal puncture, guide device deployment and  to assess the final result and screen for complications.    IMPRESSIONS     1. Left ventricular ejection fraction, by estimation, is 60 to 65%. The  left ventricle has normal function. The left ventricle has no regional  wall motion abnormalities.   2. Right ventricular systolic function is normal. The right ventricular  size is normal.   3. Left atrial size was moderately dilated. No left atrial/left atrial  appendage thrombus was detected.   4. A small pericardial effusion is present. The pericardial effusion is  localized near the right atrium and anterior to the right ventricle.   5. The mitral valve is grossly normal. No evidence of mitral valve  regurgitation. No evidence of mitral stenosis.   6. The aortic valve is tricuspid. Aortic valve regurgitation is not  visualized. No aortic stenosis is present.   7. The inferior vena cava is normal in size with greater than 50%  respiratory variability, suggesting right atrial pressure of 3 mmHg.   8. Evidence of atrial level shunting detected by color flow Doppler.  There is a small patent foramen ovale with predominantly left to right  shunting across the atrial septum.   Comparison(s): At the end of the procedure, a well seated 27 mm Watchman  device occludes the left atrial appendage, with good device compression  and no evidence of peridevice leak. There is a tiny iatrogenic atrial  septal defect with exclusively  left-to-right shunt.   Conclusion(s)/Recommendation(s): Normal biventricular function without  evidence of hemodynamically significant valvular heart disease.  Interventional TEE for LAAO procedure. Prior to procedure, there was no  evidence of LAA thrombus. Maximal left atrial  appendage dimension was 21mm, with sufficient depth for a 27mm  Watchman  FLX device. Transseptal puncture performed with no complication. Placement  of the device was optimized with no peri-device leak. Post procedure there  was a left to right shunt. No  change in pericardial effusion size before and after case.   FINDINGS   Left Ventricle: Left ventricular ejection fraction, by estimation, is 60  to 65%. The left ventricle has normal function. The left ventricle has no  regional wall motion abnormalities. The left ventricular internal cavity  size was normal in size. There is   no left ventricular hypertrophy.   Right Ventricle: The right ventricular size is normal. No increase in  right ventricular wall thickness. Right ventricular systolic function is  normal.   Left Atrium: Left atrial size was moderately dilated. No left atrial/left  atrial appendage thrombus was detected.   Right Atrium: Right atrial size was normal in size.   Pericardium: A small pericardial effusion is present. The pericardial  effusion is localized near the right atrium and anterior to the right  ventricle.   Mitral Valve: The mitral valve is grossly normal. No evidence of  mitral  valve regurgitation. No evidence of mitral valve stenosis.   Tricuspid Valve: The tricuspid valve is grossly normal. Tricuspid valve  regurgitation is trivial. No evidence of tricuspid stenosis.   Aortic Valve: The aortic valve is tricuspid. Aortic valve regurgitation is  not visualized. No aortic stenosis is present.   Pulmonic Valve: The pulmonic valve was grossly normal. Pulmonic valve  regurgitation is trivial. No evidence of pulmonic stenosis.   Aorta: The aortic root and ascending aorta are structurally normal, with  no evidence of dilitation.   Venous: The inferior vena cava is normal in size with greater than 50%  respiratory variability, suggesting right atrial pressure of 3 mmHg.   IAS/Shunts: Evidence of atrial level shunting detected by color flow  Doppler. A small  patent foramen ovale is detected with predominantly left  to right shunting across the atrial septum.   Thurmon Fair MD  Electronically signed by Thurmon Fair MD  Signature Date/Time: 08/02/2022/11:09:32 AM       Assessment & Plan   1.  Atrial fibrillation-denies episodes of accelerated or irregular heartbeat.  Status post Watchman and atrial fibrillation ablation.  Transitioned off apixaban 09/16/2022.  Started on Plavix and aspirin 09/17/2022.  Plavix stopped 01/30/2023.  Denies bleeding issues. Continue aspirin Follows with structural heart  Essential hypertension-BP today 120/62 Maintain blood pressure log Continue metoprolol Heart healthy low-sodium diet  Palpitations-no recent episodes. Continue metoprolol Avoid triggers caffeine, chocolate, EtOH, dehydration etc. Increase physical activity as tolerated  Hyperlipidemia-LDL 134 on 12/08/2019. Continue omega-3 fatty acids, aspirin High-fiber diet-reviewed, handout given Follows with PCP   Disposition: Follow-up with Dr. Bjorn Pippin in 9-12 months.   Thomasene Ripple. Ylianna Almanzar NP-C     06/03/2023, 1:57 PM [facility] [address] Office [phone] Fax [phone]    I spent 13 minutes examining this patient, reviewing medications, and using patient centered shared decision making involving her cardiac care.  Prior to her visit I spent greater than 20 minutes reviewing her past medical history,  medications, and prior cardiac tests.

## 2023-06-03 ENCOUNTER — Ambulatory Visit: Payer: Medicare Other | Attending: Cardiology | Admitting: General Practice

## 2023-06-03 ENCOUNTER — Encounter: Payer: Self-pay | Admitting: General Practice

## 2023-06-03 ENCOUNTER — Ambulatory Visit: Payer: Medicare Other | Admitting: Cardiology

## 2023-06-03 VITALS — BP 120/62 | HR 57 | Ht 68.0 in | Wt 190.0 lb

## 2023-06-03 DIAGNOSIS — E785 Hyperlipidemia, unspecified: Secondary | ICD-10-CM | POA: Diagnosis not present

## 2023-06-03 DIAGNOSIS — I10 Essential (primary) hypertension: Secondary | ICD-10-CM | POA: Diagnosis not present

## 2023-06-03 DIAGNOSIS — I480 Paroxysmal atrial fibrillation: Secondary | ICD-10-CM | POA: Diagnosis not present

## 2023-06-03 DIAGNOSIS — R002 Palpitations: Secondary | ICD-10-CM

## 2023-06-03 NOTE — Patient Instructions (Signed)
Medication Instructions:  The current medical regimen is effective;  continue present plan and medications as directed. Please refer to the Current Medication list given to you today.  *If you need a refill on your cardiac medications before your next appointment, please call your pharmacy*  Lab Work: NONE  Testing/Procedures: NONE  Follow-Up: At [facility], you and your health needs are our priority.  As part of our continuing mission to provide you with exceptional heart care, we have created designated Provider Care Teams.  These Care Teams include your primary Cardiologist (physician) and Advanced Practice Providers (APPs -  Physician Assistants and Nurse Practitioners) who all work together to provide you with the care you need, when you need it.  Your next appointment:   12 month(s)  Provider:   Little Ishikawa, MD     Other Instructions PLEASE READ AND FOLLOW ATTACHED  HEART HEALTHY DIET ATTACHED     Heart-Healthy Eating Plan Eating a healthy diet is important for the health of your heart. A heart-healthy eating plan includes: Eating less unhealthy fats. Eating more healthy fats. Eating less salt in your food. Salt is also called sodium. Making other changes in your diet. Talk with your doctor or a diet specialist (dietitian) to create an eating plan that is right for you. What are tips for following this plan? Cooking Avoid frying your food. Try to bake, boil, grill, or broil it instead. You can also reduce fat by: Removing the skin from poultry. Removing all visible fats from meats. Steaming vegetables in water or broth. Meal planning  At meals, divide your plate into four equal parts: Fill one-half of your plate with vegetables and green salads. Fill one-fourth of your plate with whole grains. Fill one-fourth of your plate with lean protein foods. Eat 2-4 cups of vegetables per day. One cup of vegetables is: 1 cup (91 g) broccoli or  cauliflower florets. 2 medium carrots. 1 large bell pepper. 1 large sweet potato. 1 large tomato. 1 medium white potato. 2 cups (150 g) raw leafy greens. Eat 1-2 cups of fruit per day. One cup of fruit is: 1 small apple 1 large banana 1 cup (237 g) mixed fruit, 1 large orange,  cup (82 g) dried fruit, 1 cup (240 mL) 100% fruit juice. Eat more foods that have soluble fiber. These are apples, broccoli, carrots, beans, peas, and barley. Try to get 20-30 g of fiber per day. Eat 4-5 servings of nuts, legumes, and seeds per week: 1 serving of dried beans or legumes equals  cup (90 g) cooked. 1 serving of nuts is  oz (12 almonds, 24 pistachios, or 7 walnut halves). 1 serving of seeds equals  oz (8 g). General information Eat more home-cooked food. Eat less restaurant, buffet, and fast food. Limit or avoid alcohol. Limit foods that are high in starch and sugar. Avoid fried foods. Lose weight if you are overweight. Keep track of how much salt (sodium) you eat. This is important if you have high blood pressure. Ask your doctor to tell you more about this. Try to add vegetarian meals each week. Fats Choose healthy fats. These include olive oil and canola oil, flaxseeds, walnuts, almonds, and seeds. Eat more omega-3 fats. These include salmon, mackerel, sardines, tuna, flaxseed oil, and ground flaxseeds. Try to eat fish at least 2 times each week. Check food labels. Avoid foods with trans fats or high amounts of saturated fat. Limit saturated fats. These are often found in animal products,  such as meats, butter, and cream. These are also found in plant foods, such as palm oil, palm kernel oil, and coconut oil. Avoid foods with partially hydrogenated oils in them. These have trans fats. Examples are stick margarine, some tub margarines, cookies, crackers, and other baked goods. What foods should I eat? Fruits All fresh, canned (in natural juice), or frozen fruits. Vegetables Fresh or  frozen vegetables (raw, steamed, roasted, or grilled). Green salads. Grains Most grains. Choose whole wheat and whole grains most of the time. Rice and pasta, including brown rice and pastas made with whole wheat. Meats and other proteins Lean, well-trimmed beef, veal, pork, and lamb. Chicken and [place] without skin. All fish and shellfish. Wild duck, rabbit, pheasant, and venison. Egg whites or low-cholesterol egg substitutes. Dried beans, peas, lentils, and tofu. Seeds and most nuts. Dairy Low-fat or nonfat cheeses, including ricotta and mozzarella. Skim or 1% milk that is liquid, powdered, or evaporated. Buttermilk that is made with low-fat milk. Nonfat or low-fat yogurt. Fats and oils Non-hydrogenated (trans-free) margarines. Vegetable oils, including soybean, sesame, sunflower, olive, peanut, safflower, corn, canola, and cottonseed. Salad dressings or mayonnaise made with a vegetable oil. Beverages Mineral water. Coffee and tea. Diet carbonated beverages. Sweets and desserts Sherbet, gelatin, and fruit ice. Small amounts of dark chocolate. Limit all sweets and desserts. Seasonings and condiments All seasonings and condiments. The items listed above may not be a complete list of foods and drinks you can eat. Contact a dietitian for more options. What foods should I avoid? Fruits Canned fruit in heavy syrup. Fruit in cream or butter sauce. Fried fruit. Limit coconut. Vegetables Vegetables cooked in cheese, cream, or butter sauce. Fried vegetables. Grains Breads that are made with saturated or trans fats, oils, or whole milk. Croissants. Sweet rolls. Donuts. High-fat crackers, such as cheese crackers. Meats and other proteins Fatty meats, such as hot dogs, ribs, sausage, bacon, rib-eye roast or steak. High-fat deli meats, such as salami and bologna. Caviar. Domestic duck and goose. Organ meats, such as liver. Dairy Cream, sour cream, cream cheese, and creamed cottage cheese. Whole-milk  cheeses. Whole or 2% milk that is liquid, evaporated, or condensed. Whole buttermilk. Cream sauce or high-fat cheese sauce. Yogurt that is made from whole milk. Fats and oils Meat fat, or shortening. Cocoa butter, hydrogenated oils, palm oil, coconut oil, palm kernel oil. Solid fats and shortenings, including bacon fat, salt pork, lard, and butter. Nondairy cream substitutes. Salad dressings with cheese or sour cream. Beverages Regular sodas and juice drinks with added sugar. Sweets and desserts Frosting. Pudding. Cookies. Cakes. Pies. Milk chocolate or white chocolate. Buttered syrups. Full-fat ice cream or ice cream drinks. The items listed above may not be a complete list of foods and drinks to avoid. Contact a dietitian for more information. Summary Heart-healthy meal planning includes eating less unhealthy fats, eating more healthy fats, and making other changes in your diet. Eat a balanced diet. This includes fruits and vegetables, low-fat or nonfat dairy, lean protein, nuts and legumes, whole grains, and heart-healthy oils and fats. This information is not intended to replace advice given to you by your health care provider. Make sure you discuss any questions you have with your health care provider. Document Revised: 07/31/2021 Document Reviewed: 07/31/2021 Elsevier Patient Education  2024 [employer].

## 2023-07-01 ENCOUNTER — Ambulatory Visit
Admission: EM | Admit: 2023-07-01 | Discharge: 2023-07-01 | Disposition: A | Discharge status: Home / Self Care | Payer: Medicare Other | Attending: Family Medicine | Admitting: Family Medicine

## 2023-07-01 DIAGNOSIS — J0100 Acute maxillary sinusitis, unspecified: Secondary | ICD-10-CM

## 2023-07-01 MED ORDER — AMOXICILLIN 875 MG PO TABS: 875.0000 mg | ORAL_TABLET | Freq: Two times a day (BID) | ORAL | 0 refills | Status: DC

## 2023-07-01 MED ORDER — PROMETHAZINE-DM 6.25-15 MG/5ML PO SYRP: 5.0000 mL | ORAL_SOLUTION | Freq: Four times a day (QID) | ORAL | 0 refills | Status: DC | PRN

## 2023-07-01 NOTE — ED Triage Notes (Signed)
Pt presents to UC head congestion, dental pain, fatigue, nasal drainage x4 days. Mucinex does not help.

## 2023-07-01 NOTE — ED Provider Notes (Signed)
[facility]    CSN: [id number] Arrival date & time: 07/01/23  1352      History   Chief Complaint Chief Complaint  Patient presents with   Nasal Congestion    HPI Shannon Yu is a 67 y.o. female.   HPI Patient here with congestion, dental pain, fatigue, and nasal drainage x 4 days. No history of chronic respiratory disease. No known sick exposure. Denies wheezing, shortness of breath, chest tightness of generalized weakness.Concern for sinus infection.  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 Lalla Brothers   Sleep apnea 06/2011 dx   CPAP 13/apria   Stroke (cerebrum) [facility])    Vertigo     Patient Active Problem List   Diagnosis Date Noted   Presence of Watchman left atrial appendage closure device 08/02/2022   Hypercoagulable state due to paroxysmal atrial fibrillation (HCC) 05/28/2022   Atrial fibrillation (HCC) 03/14/2022   Rhinitis 03/05/2020   Migraine 08/13/2019   Gastroesophageal reflux disease 08/13/2019   Vitamin D deficiency 04/03/2019   Excessive drinking alcohol 04/03/2019   Elevated liver enzymes 04/03/2019   Hyperlipidemia 04/03/2019   Prediabetes- elevated A1c 03/31/2019   HTN, goal below 130/80 03/18/2019   Heart palpitations 03/18/2019   Insomnia 11/05/2017   Routine general medical examination at a health care facility 09/23/2015   Essential hypertension 09/23/2015   Brain aneurysm 02/10/2014   Osteoarthritis    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   SAH (subarachnoid hemorrhage)  (HCC) 05/10/2012   Intracranial aneurysm 05/09/2012   Obstructive sleep apnea 06/30/2011    Past Surgical History:  Procedure Laterality Date   ANEURYSM COILING  05/09/2012   middle   ATRIAL FIBRILLATION ABLATION N/A 04/30/2022   Procedure: ATRIAL FIBRILLATION ABLATION;  Surgeon: Lanier Prude, MD;  Location: [facility] INVASIVE CV LAB;  Service: Cardiovascular;  Laterality: N/A;   Brain stent  2013, 2015   BREAST BIOPSY Right 1988   BENIGN   BREAST BIOPSY Left 1993   BENIGN   COLONOSCOPY  12/13/2020   IR RADIOLOGIST EVAL & MGMT  09/13/2021   KNEE ARTHROSCOPY Left    LEFT ATRIAL APPENDAGE OCCLUSION N/A 08/02/2022   Procedure: LEFT ATRIAL APPENDAGE OCCLUSION;  Surgeon: Lanier Prude, MD;  Location: [facility] INVASIVE CV LAB;  Service: Cardiovascular;  Laterality: N/A;   RADIOLOGY WITH ANESTHESIA N/A 09/03/2012   Procedure: RADIOLOGY WITH ANESTHESIA;  Surgeon: Oneal Grout, MD;  Location: [facility] OR;  Service: Radiology;  Laterality: N/A;  aneurysm embolization    RADIOLOGY WITH ANESTHESIA N/A 02/10/2014   Procedure: RADIOLOGY WITH ANESTHESIA, emobolization;  Surgeon: Oneal Grout, MD;  Location: [facility] OR;  Service: Radiology;  Laterality: N/A;   TEE WITHOUT CARDIOVERSION N/A 08/02/2022   Procedure: TRANSESOPHAGEAL ECHOCARDIOGRAM (TEE);  Surgeon: Lanier Prude, MD;  Location: [facility] INVASIVE CV LAB;  Service: Cardiovascular;  Laterality: N/A;    OB History   No obstetric history on file.      Home Medications  Prior to Admission medications   Medication Sig Start Date End Date Taking? Authorizing Provider  amoxicillin (AMOXIL) 875 MG tablet Take 1 tablet (875 mg total) by mouth 2 (two) times daily. 07/01/23  Yes Bing Neighbors, NP  promethazine-dextromethorphan (PROMETHAZINE-DM) 6.25-15 MG/5ML syrup Take 5 mLs by mouth 4 (four) times daily as needed for cough (nasal congestion). 07/01/23  Yes Bing Neighbors, NP  aspirin EC 81 MG tablet Take 1 tablet (81 mg total) by mouth  daily. Swallow whole. START ON 3/11 AND STOP ELIQUIS ON 3/10 09/12/22   Georgie Chard D, NP  cetirizine (ZYRTEC) 10 MG tablet Take 10 mg by mouth 2 (two) times daily.    [provider]  magnesium gluconate (MAGONATE) 500 MG tablet Take 500 mg by mouth daily.    [provider]  metoprolol tartrate (LOPRESSOR) 25 MG tablet TAKE 1 TABLET TWICE A DAY. 11/06/22   Little Ishikawa, MD  Naphazoline HCl (CLEAR EYES OP) Place 1 drop into both eyes daily.    [provider]  Omega-3 Fatty Acids (FISH OIL) 1200 MG CAPS Take 1,200 mg by mouth daily.    [provider]  omeprazole (PRILOSEC) 20 MG capsule Take 1 capsule (20 mg total) by mouth daily. 05/28/22   Fenton, Clint R, PA  traZODone (DESYREL) 50 MG tablet 1 at bedtime as needed for sleep 03/05/23   Jetty Duhamel D, MD  Vitamin D, Cholecalciferol, 25 MCG (1000 UT) TABS Take 25 mcg by mouth daily.    [provider]    Family History Family History  Problem Relation Age of Onset   Emphysema Father        smoker   Lung cancer Father 22   Prostate cancer Father    Kidney cancer Father    Scleroderma Mother 63   Heart attack Maternal Grandmother    Colon polyps Neg Hx    Colon cancer Neg Hx    Esophageal cancer Neg Hx    Stomach cancer Neg Hx    Rectal cancer Neg Hx     Social History Social History   Tobacco Use   Smoking status: Former    Current packs/day: 0.00    Average packs/day: 0.3 packs/day for 38.0 years (9.5 ttl pk-yrs)    Types: Cigarettes    Start date: 56    Quit date: 07/10/2011    Years since quitting: 11.9   Smokeless tobacco: Never  Vaping Use   Vaping status: Never Used  Substance Use Topics   Alcohol use: Yes    Comment: 1 glass of wine ocassionally   Drug use: No     Allergies   Patient has no known allergies.   Review of Systems Review of Systems  Constitutional:  Negative for fever.  HENT:  Positive for congestion, postnasal drip, rhinorrhea and  sore throat (Scratchy throat).      Physical Exam Triage Vital Signs ED Triage Vitals  Encounter Vitals Group     BP 07/01/23 1402 134/88     Systolic BP Percentile --      Diastolic BP Percentile --      Pulse Rate 07/01/23 1402 91     Resp 07/01/23 1402 18     Temp 07/01/23 1402 99.4 F (37.4 C)     Temp Source 07/01/23 1402 Oral     SpO2 07/01/23 1402 95 %     Weight --      Height --      Head Circumference --  Peak Flow --      Pain Score 07/01/23 1401 0     Pain Loc --      Pain Education --      Exclude from Growth Chart --    No data found.  Updated Vital Signs BP 134/88 (BP Location: Right Arm)   Pulse 91   Temp 99.4 F (37.4 C) (Oral)   Resp 18   SpO2 95%   Visual Acuity Right Eye Distance:   Left Eye Distance:   Bilateral Distance:    Right Eye Near:   Left Eye Near:    Bilateral Near:     Physical Exam Constitutional:      Appearance: Normal appearance. She is well-developed. She is ill-appearing.  HENT:     Head: Normocephalic and atraumatic.     Nose: Congestion and rhinorrhea present.     Mouth/Throat:     Pharynx: Oropharyngeal exudate and posterior oropharyngeal erythema present.  Eyes:     Extraocular Movements: Extraocular movements intact.     Conjunctiva/sclera: Conjunctivae normal.     Pupils: Pupils are equal, round, and reactive to light.  Neck:     Thyroid: No thyromegaly.     Trachea: No tracheal deviation.  Cardiovascular:     Rate and Rhythm: Normal rate and regular rhythm.     Pulses: Normal pulses.     Heart sounds: Normal heart sounds.  Pulmonary:     Effort: Pulmonary effort is normal.     Breath sounds: Normal breath sounds.  Abdominal:     General: There is no distension.     Tenderness: There is no abdominal tenderness.  Musculoskeletal:     Cervical back: Normal range of motion and neck supple.  Skin:    General: Skin is warm and dry.  Neurological:     General: No focal deficit present.     Mental  Status: She is alert and oriented to person, place, and time.  Psychiatric:        Behavior: Behavior normal.        Thought Content: Thought content normal.        Judgment: Judgment normal.      UC Treatments / Results  Labs (all labs ordered are listed, but only abnormal results are displayed) Labs Reviewed - No data to display  EKG   Radiology No results found.  Procedures Procedures (including critical care time)  Medications Ordered in UC Medications - No data to display  Initial Impression / Assessment and Plan / UC Course  I have reviewed the triage vital signs and the nursing notes.  Pertinent labs & imaging results that were available during my care of the patient were reviewed by me and considered in my medical decision making (see chart for details).    Acute sinusitis, uncomplicated, empiric treatment with amoxicillin 875 twice daily for 10 days.  For acute cough Promethazine DM up to 4 times daily as needed for cough.  Return precautions given if symptoms worsen or do not improve.  Patient verbalized understanding and agreement with plan. Final Clinical Impressions(s) / UC Diagnoses   Final diagnoses:  Acute non-recurrent maxillary sinusitis   Discharge Instructions   None    ED Prescriptions     Medication Sig Dispense Auth. Provider   promethazine-dextromethorphan (PROMETHAZINE-DM) 6.25-15 MG/5ML syrup Take 5 mLs by mouth 4 (four) times daily as needed for cough (nasal congestion). 180 mL Bing Neighbors, NP   amoxicillin (AMOXIL) 875 MG tablet Take 1  tablet (875 mg total) by mouth 2 (two) times daily. 20 tablet Bing Neighbors, NP      PDMP not reviewed this encounter.   Bing Neighbors, NP 07/01/23 [phone]

## 2023-10-09 ENCOUNTER — Other Ambulatory Visit: Payer: Self-pay | Admitting: Cardiology

## 2024-01-03 ENCOUNTER — Encounter (HOSPITAL_COMMUNITY): Payer: Self-pay | Admitting: Interventional Radiology

## 2024-01-14 ENCOUNTER — Other Ambulatory Visit (HOSPITAL_COMMUNITY): Payer: Self-pay | Admitting: Interventional Radiology

## 2024-01-14 DIAGNOSIS — I671 Cerebral aneurysm, nonruptured: Secondary | ICD-10-CM

## 2024-03-02 NOTE — Progress Notes (Signed)
 HPI female former smoker followed for OSA, allergic rhinitis, complicated by brain aneurysms/SAH NPSG 07/06/11- Mild OSA AHI 7.8/ hr, nonpositional, loud snoring, desat to 83%.   ----------------------------------------------------------------------------------. .   03/04/23-  -----Pt states she doesn't sleep well, currently on Ambien  but not really helping much Doing fine with CPAP- download reviewed. Ambien  helps sleep onset, but for the past year she has had trouble maintaining sleep- unclear why. Discussed trial of Lunesta  as alternative. 68 year old female former smoker (2.5 pk yrs)followed for OSA, allergic rhinitis, complicated by brain aneurysms/SAH/stent 2013 , Covid infection March 2022, AFib/ ablation/Watchman,, -ambien  5, CPAP auto 10-15/[facility]      AirSense 10 AutoSet Download-compliance   93%, AHI 2.6/hr Body weight today-190 lbsCardiac ablation and Watchman went well. CXR 08/02/22- IMPRESSION: No active cardiopulmonary disease.  03/03/24- 68 year old female former smoker (2.5 pk yrs)followed for OSA, allergic rhinitis, complicated by brain aneurysms/SAH/stent 2013 , Covid infection March 2022, AFib/ ablation/Watchman,, -ambien  5, CPAP auto 10-15/[facility]      AirSense 10 AutoSet Download-compliance  - 100%, AHI 1/hr  Body weight today-187 lbs Discussed the use of AI scribe software for clinical note transcription with the patient, who gave verbal consent to proceed.  History of Present Illness   Shannon Yu is a 68 year old female who presents for a follow-up on her sleep apnea management and medication review.  She uses a CPAP machine effectively for sleep apnea, with settings between ten and fifteen, averaging twelve. She experiences approximately one breakthrough event per hour. Trazodone  is taken at half the dose for sleep due to next-day depressive symptoms at the full dose, and this adjusted dose is effective. Previously, she used Lunesta  but discontinued it due to  cost.   She has had successful cardiac ablation and Watchman placement, followed by cardiology.     Assessment and Plan:    Obstructive sleep apnea Obstructive sleep apnea well-managed with CPAP therapy. CPAP settings effective with minimal breakthrough events. Compliance and satisfaction reported.  Insomnia Insomnia managed with reduced dose of trazodone  due to side effects. Effective and preferred over Lunesta . - Refill trazodone  prescription.     ROS-see HPI + = positive Constitutional:   No-   weight loss, night sweats, fevers, chills, fatigue, lassitude. HEENT:   No-  headaches, difficulty swallowing, tooth/dental problems, sore throat,       Some  sneezing, itching, ear ache, nasal congestion, post nasal drip,  CV:  No-   chest pain, orthopnea, PND, swelling in lower extremities, anasarca, dizziness, palpitations Resp: No-   shortness of breath with exertion or at rest.              No-   productive cough,  No non-productive cough,  No- coughing up of blood,   change in color of mucus.  No- wheezing.   Skin: No-   rash or lesions. GI:  No-   heartburn, indigestion, abdominal pain, nausea, vomiting,  GU: MS: Normal apparent strength and muscle bulk Neuro-     nothing unusual Psych:  No- change in mood or affect. No depression or anxiety.  No memory loss.  OBJ           General- Alert, Oriented, Affect-appropriate/ friendly, Distress- none acute, tall Skin- rash-none, lesions- none, excoriation- none Lymphadenopathy- none Head- atraumatic            Eyes- Gross vision intact, PERRLA, conjunctivae clear secretions            Ears- Hearing, canals-normal  Nose- Clear, no-Septal dev, mucus, polyps, erosion, perforation             Throat- Mallampati II-III , mucosa clear , drainage- none, tonsils- atrophic Neck- flexible , trachea midline, no stridor , thyroid nl, carotid no bruit Chest - symmetrical excursion , unlabored           Heart/CV- RRR today , no murmur ,  no gallop  , no rub, nl s1 s2                           - JVD- none , edema- none, stasis changes- none, varices- none           Lung- clear to P&A, wheeze- none, cough- none , dullness-none, rub- none           Chest wall-  Abd- Br/ Gen/ Rectal- Not done, not indicated Extrem- cyanosis- none, clubbing, none, atrophy- none, strength- nl Neuro- grossly intact to observation

## 2024-03-03 ENCOUNTER — Ambulatory Visit (INDEPENDENT_AMBULATORY_CARE_PROVIDER_SITE_OTHER): Payer: Medicare Other | Admitting: Internal Medicine

## 2024-03-03 ENCOUNTER — Encounter: Payer: Self-pay | Admitting: Internal Medicine

## 2024-03-03 VITALS — BP 126/68 | HR 61 | Temp 98.0°F | Ht 68.0 in | Wt 187.8 lb

## 2024-03-03 DIAGNOSIS — G4733 Obstructive sleep apnea (adult) (pediatric): Secondary | ICD-10-CM

## 2024-03-03 MED ORDER — TRAZODONE HCL 50 MG PO TABS: ORAL_TABLET | ORAL | 5 refills | Status: AC

## 2024-03-03 NOTE — Patient Instructions (Signed)
 Trazodone  was refilled   We can continue CPAP auto 10-15  Please call if we can help

## 2024-06-08 ENCOUNTER — Telehealth: Payer: Self-pay

## 2024-06-08 NOTE — Telephone Encounter (Signed)
 Patient returned call. She had LAAO on 08/02/22. The patient reports doing well with no issues.  Arranged routine OV with Dr. Kate 07/23/2023. The patient understands to call with questions or concerns.

## 2024-06-08 NOTE — Telephone Encounter (Signed)
 Left voicemail for patient to return call to see how she is doing 2 year post watchman implant. Can offer to schedule routine f/u per recall.

## 2024-07-21 NOTE — Progress Notes (Unsigned)
 " Cardiology Office Note:    Date:  07/21/2024   ID:  Shannon Yu, Shannon Yu 31-Oct-1955, MRN [id number]  PCP:  Abran Jon CROME, MD  Cardiologist:  Lonni CROME Nanas, MD  Electrophysiologist:  OLE ONEIDA HOLTS, MD (Inactive)   Referring MD: Abran Jon CROME, MD   No chief complaint on file.   History of Present Illness:    Shannon Yu is a 69 y.o. female with a hx of brain aneurysm, SAH, cervical cancer, OSA who presents for follow-up.  She was referred by Dr. Knox for evaluation of atrial fibrillation, initially seen on 08/21/2021.  She recently underwent removal of a mass on her arm and during the procedure was found to be in atrial fibrillation.  She does have a history of a subarachnoid hemorrhage.  She was admitted in November 2013 with subarachnoid hemorrhage.  CT a showed aneurysm and she was treated with coil embolization.  She was found on angiogram in 2014 to have remnant of aneurysm and underwent additional coiling and stent placement 08/2012.  Required further stenting 02/2014.  She reports that she had schwannoma removal and was told she had A-fib during the procedure.  She is not sure how long the A-fib lasted.  She reports rare palpitations where feels like heart is racing, occurs few times per year and lasts minutes.  Denies any lightheadedness, syncope, or chest pain.  Does report some dyspnea.  No lower extremity edema.  Reports BP usually well controlled but has not been checking recently.  Smoked for 15 years about 1 pack/day, quit in 2013.  Family history includes mother had Wolff-Parkinson-White syndrome.  Echocardiogram on 09/18/2021 showed normal biventricular function, no significant valvular disease.  Zio patch x14 days on 09/18/2021 showed 3% atrial fibrillation burden, average rate 127 bpm with longest episode lasting 34 minutes.  She was referred to Dr. Holts in the EP and underwent A-fib ablation on 04/30/2022.  She underwent Watchman procedure 08/02/2022.  Since last  clinic visit, she reports she is doing well.  Denies any chest pain, dyspnea, lower extremity edema, or palpitations.  Reports some lightheadedness when she moves her head in certain ways but denies any syncope.  Reports compliance with her CPAP.  She has been taking Eliquis , denies any bleeding issues.    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 Holts   Sleep apnea 06/2011 dx   CPAP 13/apria   Stroke (cerebrum) (HCC)    Vertigo     Past Surgical History:  Procedure Laterality Date   ANEURYSM COILING  05/09/2012   middle   ATRIAL FIBRILLATION ABLATION N/A 04/30/2022   Procedure: ATRIAL FIBRILLATION ABLATION;  Surgeon: Holts Ole ONEIDA, MD;  [facility];  Service: Cardiovascular;  Laterality: N/A;   Brain stent  2013, 2015   BREAST BIOPSY Right 1988   BENIGN   BREAST BIOPSY Left 1993   BENIGN   COLONOSCOPY  12/13/2020   IR RADIOLOGIST  EVAL & MGMT  09/13/2021   KNEE ARTHROSCOPY Left    LEFT ATRIAL APPENDAGE OCCLUSION N/A 08/02/2022   Procedure: LEFT ATRIAL APPENDAGE OCCLUSION;  Surgeon: Cindie Ole DASEN, MD;  [facility];  Service: Cardiovascular;  Laterality: N/A;   RADIOLOGY WITH ANESTHESIA N/A 09/03/2012   Procedure: RADIOLOGY WITH ANESTHESIA;  Surgeon: Thyra MARLA Nash, MD;  Location: [facility];  Service: Radiology;  Laterality: N/A;  aneurysm embolization    RADIOLOGY WITH ANESTHESIA N/A 02/10/2014   Procedure: RADIOLOGY WITH ANESTHESIA, emobolization;  Surgeon: Thyra MARLA Nash, MD;   [facility];  Service: Radiology;  Laterality: N/A;   TEE WITHOUT CARDIOVERSION N/A 08/02/2022   Procedure: TRANSESOPHAGEAL ECHOCARDIOGRAM (TEE);  Surgeon: Cindie Ole DASEN, MD;  Location: [facility] INVASIVE CV LAB;  Service: Cardiovascular;  Laterality: N/A;    Current Medications: No outpatient medications have been marked as taking for the 07/22/24 encounter (Appointment) with Kate Lonni CROME, MD.     Allergies:   Patient has no known allergies.   Social History   Socioeconomic History   Marital status: Significant Other    Spouse name: Not on file   Number of children: 1   Years of education: College degree   Highest education level: Not on file  Occupational History   Occupation: PERSONNEL    Employer: [employer]  Tobacco Use   Smoking status: Former    Current packs/day: 0.00    Average packs/day: 0.3 packs/day for 38.0 years (9.5 ttl pk-yrs)    Types: Cigarettes    Start date: 14    Quit date: 07/10/2011    Years since quitting: 13.0   Smokeless tobacco: Never  Vaping Use   Vaping status: Never Used  Substance and Sexual Activity   Alcohol use: Yes    Comment: 1 glass of wine ocassionally   Drug use: No   Sexual activity: Not Currently  Other Topics Concern   Not on file  Social History Narrative   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[employer]; 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   Right handed   Social Drivers of Health   Tobacco Use: Medium Risk (03/03/2024)   Patient History    Smoking Tobacco Use: Former    Smokeless Tobacco Use: Never    Passive Exposure: Not on Actuary Strain: Low Risk (11/05/2023)   Received from [facility]   Overall Financial Resource Strain (CARDIA)    Difficulty of Paying Living Expenses: Not hard at all  Food Insecurity: No Food Insecurity (11/05/2023)   Received from [facility]   Epic    Within the past 12 months, you worried that your food would run out before you got the money to buy more.: Never true    Within the  past 12 months, the food you bought just didn't last and you didn't have money to get more.: Never true  Transportation Needs: No Transportation Needs (11/05/2023)   Received from [facility] - Transportation    Lack of Transportation (Medical): No    Lack of Transportation (Non-Medical): No  Physical Activity: Sufficiently Active (10/29/2022)   Received from [facility]   Exercise Vital Sign    On average, how many days per week do you engage in moderate to strenuous exercise (like a brisk walk)?: 5 days    On average, how many minutes do you engage in exercise at this level?: 60 min  Stress: No Stress Concern Present (10/29/2022)   Received from [facility]  [employer] [facility] [employer] [facility] - Occupational Stress Questionnaire    Feeling of Stress : Not at all  Social Connections: Socially Integrated (10/29/2022)   Received from [facility]   Social Network    How would you rate your social network (family, work, friends)?: Good participation with social networks  Depression (PHQ2-9): Not on file  Alcohol Screen: Not on file  Housing: Low Risk (11/05/2023)   Received from [facility]    In the last 12 months, was there a time when you were not able to pay the mortgage or rent on time?: No    In the past 12 months, how many times have you moved where you were living?: 1    At any time in the past 12 months, were you homeless or living in a shelter (including now)?: No  Utilities: Not At Risk (11/05/2023)   Received from [facility] Utilities    Threatened with loss of utilities: No  Health Literacy: Not on file     Family History: The patient's family history includes Emphysema in her father; Heart attack in her maternal grandmother; Kidney cancer in her father; Lung cancer (age of onset: 92) in her father; Prostate cancer in her father; Scleroderma (age of onset: 50) in her mother. There is no history of Colon polyps, Colon cancer, Esophageal  cancer, Stomach cancer, or Rectal cancer.  ROS:   Please see the history of present illness.     All other systems reviewed and are negative.  EKGs/Labs/Other Studies Reviewed:    The following studies were reviewed today:   EKG:   07/20/21: Atrial fibrillation, rate 137, diffuse ST depressions with aVR elevation 08/21/2021: Normal sinus rhythm, rate 60, nonspecific T wave flattening 11/16/21: Sinus bradycardia, rate 59, nonspecific T wave flattening 05/24/22: NSR, rate 65, no ST abnormalities   Recent Labs: No results found for requested labs within last 365 days.  Recent Lipid Panel    Component Value Date/Time   CHOL 211 (H) 12/08/2019 0842   TRIG 71 12/08/2019 0842   HDL 64 12/08/2019 0842   CHOLHDL 3.3 12/08/2019 0842   CHOLHDL 4 06/09/2014 0818   VLDL 11.0 06/09/2014 0818   LDLCALC 134 (H) 12/08/2019 0842    Physical Exam:    VS:  There were no vitals taken for this visit.    Wt Readings from Last 3 Encounters:  03/03/24 187 lb 12.8 oz (85.2 kg)  06/03/23 190 lb (86.2 kg)  03/04/23 190 lb 12.8 oz (86.5 kg)     GEN:  Well nourished, well developed in no acute distress HEENT: Normal NECK: No JVD; No carotid bruits LYMPHATICS: No lymphadenopathy CARDIAC: RRR, no murmurs, rubs, gallops RESPIRATORY:  Clear to auscultation without rales, wheezing or rhonchi  ABDOMEN: Soft, non-tender, non-distended MUSCULOSKELETAL:  No edema; No deformity  SKIN: Warm and dry NEUROLOGIC:  Alert and oriented x 3 PSYCHIATRIC:  Normal affect   ASSESSMENT:    No diagnosis found.   PLAN:    Atrial fibrillation: Diagnosed initially while under general anesthesia for removal of skin mass.  Rates up to 130s.  Echocardiogram on 09/18/2021 showed normal biventricular function, no significant valvular disease.  Zio patch x14 days on 09/18/2021 showed 3% atrial fibrillation burden, average rate 127 bpm with longest episode lasting 34 minutes.  She was referred to Dr. Cindie in the EP and  underwent A-fib ablation on 04/30/2022.  She underwent Watchman procedure 08/02/2022. -Continue metoprolol  25 mg twice daily  Hypertension: On metoprolol  25 mg twice daily.    OSA: on CPAP, reports compliance  Hyperlipidemia: LDL 131 on 05/02/2022.  Calcium  score 1 on 05/10/2022 (52nd percentile).  Recommend starting statin but she has declined***   RTC in 6 months***   Medication Adjustments/Labs and Tests Ordered: Current medicines are reviewed at length with the patient today.  Concerns regarding medicines are outlined above.  No orders of the defined types were placed in this encounter.  No orders of the defined types were placed in this encounter.   There are no Patient Instructions on file for this visit.   Signed, Lonni LITTIE Nanas, MD  07/21/2024 1:00 PM    [facility] "

## 2024-07-22 ENCOUNTER — Ambulatory Visit: Admitting: Cardiology

## 2024-07-28 NOTE — Progress Notes (Unsigned)
 " Cardiology Office Note:    Date:  07/28/2024   ID:  Shannon, Yu 02/26/1956, MRN [id number]  PCP:  Abran Jon CROME, MD  Cardiologist:  Lonni CROME Nanas, MD  Electrophysiologist:  OLE ONEIDA HOLTS, MD (Inactive)   Referring MD: Abran Jon CROME, MD   No chief complaint on file.   History of Present Illness:    Shannon Yu is a 69 y.o. female with a hx of brain aneurysm, SAH, cervical cancer, OSA who presents for follow-up.  She was referred by Dr. Knox for evaluation of atrial fibrillation, initially seen on 08/21/2021.  She recently underwent removal of a mass on her arm and during the procedure was found to be in atrial fibrillation.  She does have a history of a subarachnoid hemorrhage.  She was admitted in November 2013 with subarachnoid hemorrhage.  CT a showed aneurysm and she was treated with coil embolization.  She was found on angiogram in 2014 to have remnant of aneurysm and underwent additional coiling and stent placement 08/2012.  Required further stenting 02/2014.  She reports that she had schwannoma removal and was told she had A-fib during the procedure.  She is not sure how long the A-fib lasted.  She reports rare palpitations where feels like heart is racing, occurs few times per year and lasts minutes.  Denies any lightheadedness, syncope, or chest pain.  Does report some dyspnea.  No lower extremity edema.  Reports BP usually well controlled but has not been checking recently.  Smoked for 15 years about 1 pack/day, quit in 2013.  Family history includes mother had Wolff-Parkinson-White syndrome.  Echocardiogram on 09/18/2021 showed normal biventricular function, no significant valvular disease.  Zio patch x14 days on 09/18/2021 showed 3% atrial fibrillation burden, average rate 127 bpm with longest episode lasting 34 minutes.  She was referred to Dr. Holts in the EP and underwent A-fib ablation on 04/30/2022.  She underwent Watchman procedure 08/02/2022.  Since last  clinic visit, she reports she is doing well.  Denies any chest pain, dyspnea, lower extremity edema, or palpitations.  Reports some lightheadedness when she moves her head in certain ways but denies any syncope.  Reports compliance with her CPAP.  She has been taking Eliquis , denies any bleeding issues.    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 Holts   Sleep apnea 06/2011 dx   CPAP 13/apria   Stroke (cerebrum) (HCC)    Vertigo     Past Surgical History:  Procedure Laterality Date   ANEURYSM COILING  05/09/2012   middle   ATRIAL FIBRILLATION ABLATION N/A 04/30/2022   Procedure: ATRIAL FIBRILLATION ABLATION;  Surgeon: Holts Ole ONEIDA, MD;  [facility];  Service: Cardiovascular;  Laterality: N/A;   Brain stent  2013, 2015   BREAST BIOPSY Right 1988   BENIGN   BREAST BIOPSY Left 1993   BENIGN   COLONOSCOPY  12/13/2020   IR RADIOLOGIST  EVAL & MGMT  09/13/2021   KNEE ARTHROSCOPY Left    LEFT ATRIAL APPENDAGE OCCLUSION N/A 08/02/2022   Procedure: LEFT ATRIAL APPENDAGE OCCLUSION;  Surgeon: Cindie Ole DASEN, MD;  [facility];  Service: Cardiovascular;  Laterality: N/A;   RADIOLOGY WITH ANESTHESIA N/A 09/03/2012   Procedure: RADIOLOGY WITH ANESTHESIA;  Surgeon: Thyra MARLA Nash, MD;  Location: [facility] OR;  Service: Radiology;  Laterality: N/A;  aneurysm embolization    RADIOLOGY WITH ANESTHESIA N/A 02/10/2014   Procedure: RADIOLOGY WITH ANESTHESIA, emobolization;  Surgeon: Thyra MARLA Nash, MD;   Location: [facility];  Service: Radiology;  Laterality: N/A;   TEE WITHOUT CARDIOVERSION N/A 08/02/2022   Procedure: TRANSESOPHAGEAL ECHOCARDIOGRAM (TEE);  Surgeon: Cindie Ole DASEN, MD;  Location: [facility] INVASIVE CV LAB;  Service: Cardiovascular;  Laterality: N/A;    Current Medications: No outpatient medications have been marked as taking for the 07/29/24 encounter (Appointment) with Kate Lonni CROME, MD.     Allergies:   Patient has no known allergies.   Social History   Socioeconomic History   Marital status: Significant Other    Spouse name: Not on file   Number of children: 1   Years of education: College degree   Highest education level: Not on file  Occupational History   Occupation: PERSONNEL    Employer: [employer]  Tobacco Use   Smoking status: Former    Current packs/day: 0.00    Average packs/day: 0.3 packs/day for 38.0 years (9.5 ttl pk-yrs)    Types: Cigarettes    Start date: 63    Quit date: 07/10/2011    Years since quitting: 13.0   Smokeless tobacco: Never  Vaping Use   Vaping status: Never Used  Substance and Sexual Activity   Alcohol use: Yes    Comment: 1 glass of wine ocassionally   Drug use: No   Sexual activity: Not Currently  Other Topics Concern   Not on file  Social History Narrative   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[employer]; 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   Right handed   Social Drivers of Health   Tobacco Use: Medium Risk (03/03/2024)   Patient History    Smoking Tobacco Use: Former    Smokeless Tobacco Use: Never    Passive Exposure: Not on Actuary Strain: Low Risk (11/05/2023)   Received from [facility]   Overall Financial Resource Strain (CARDIA)    Difficulty of Paying Living Expenses: Not hard at all  Food Insecurity: No Food Insecurity (11/05/2023)   [facility]   Epic    Within the past 12 months, you worried that your food would run out before you got the money to buy more.: Never true    Within the  past 12 months, the food you bought just didn't last and you didn't have money to get more.: Never true  Transportation Needs: No Transportation Needs (11/05/2023)   Received from [facility] - Transportation    Lack of Transportation (Medical): No    Lack of Transportation (Non-Medical): No  Physical Activity: Sufficiently Active (10/29/2022)   Received from [facility]   Exercise Vital Sign    On average, how many days per week do you engage in moderate to strenuous exercise (like a brisk walk)?: 5 days    On average, how many minutes do you engage in exercise at this level?: 60 min  Stress: No Stress Concern Present (10/29/2022)   Received from [facility]  [employer] of [employer]ational Health - Occupational Stress Questionnaire    Feeling of Stress : Not at all  Social Connections: Socially Integrated (10/29/2022)   Received from [facility]   Social Network    How would you rate your social network (family, work, friends)?: Good participation with social networks  Depression (PHQ2-9): Not on file  Alcohol Screen: Not on file  Housing: Low Risk (11/05/2023)   Received from [facility]    In the last 12 months, was there a time when you were not able to pay the mortgage or rent on time?: No    In the past 12 months, how many times have you moved where you were living?: 1    At any time in the past 12 months, were you homeless or living in a shelter (including now)?: No  Utilities: Not At Risk (11/05/2023)   Received from [facility] Utilities    Threatened with loss of utilities: No  Health Literacy: Not on file     Family History: The patient's family history includes Emphysema in her father; Heart attack in her maternal grandmother; Kidney cancer in her father; Lung cancer (age of onset: 44) in her father; Prostate cancer in her father; Scleroderma (age of onset: 44) in her mother. There is no history of Colon polyps, Colon cancer, Esophageal  cancer, Stomach cancer, or Rectal cancer.  ROS:   Please see the history of present illness.     All other systems reviewed and are negative.  EKGs/Labs/Other Studies Reviewed:    The following studies were reviewed today:   EKG:   07/20/21: Atrial fibrillation, rate 137, diffuse ST depressions with aVR elevation 08/21/2021: Normal sinus rhythm, rate 60, nonspecific T wave flattening 11/16/21: Sinus bradycardia, rate 59, nonspecific T wave flattening 05/24/22: NSR, rate 65, no ST abnormalities   Recent Labs: No results found for requested labs within last 365 days.  Recent Lipid Panel    Component Value Date/Time   CHOL 211 (H) 12/08/2019 0842   TRIG 71 12/08/2019 0842   HDL 64 12/08/2019 0842   CHOLHDL 3.3 12/08/2019 0842   CHOLHDL 4 06/09/2014 0818   VLDL 11.0 06/09/2014 0818   LDLCALC 134 (H) 12/08/2019 0842    Physical Exam:    VS:  There were no vitals taken for this visit.    Wt Readings from Last 3 Encounters:  03/03/24 187 lb 12.8 oz (85.2 kg)  06/03/23 190 lb (86.2 kg)  03/04/23 190 lb 12.8 oz (86.5 kg)     GEN:  Well nourished, well developed in no acute distress HEENT: Normal NECK: No JVD; No carotid bruits LYMPHATICS: No lymphadenopathy CARDIAC: RRR, no murmurs, rubs, gallops RESPIRATORY:  Clear to auscultation without rales, wheezing or rhonchi  ABDOMEN: Soft, non-tender, non-distended MUSCULOSKELETAL:  No edema; No deformity  SKIN: Warm and dry NEUROLOGIC:  Alert and oriented x 3 PSYCHIATRIC:  Normal affect   ASSESSMENT:    No diagnosis found.   PLAN:    Atrial fibrillation: Diagnosed initially while under general anesthesia for removal of skin mass.  Rates up to 130s.  Echocardiogram on 09/18/2021 showed normal biventricular function, no significant valvular disease.  Zio patch x14 days on 09/18/2021 showed 3% atrial fibrillation burden, average rate 127 bpm with longest episode lasting 34 minutes.  She was referred to Dr. Cindie in the EP and  underwent A-fib ablation on 04/30/2022.  She underwent Watchman procedure 08/02/2022. -Continue metoprolol  25 mg twice daily  Hypertension: On metoprolol  25 mg twice daily.    OSA: on CPAP, reports compliance  Hyperlipidemia: LDL 131 on 05/02/2022.  Calcium  score 1 on 05/10/2022 (52nd percentile).  Recommend starting statin but she has declined***   RTC in 6 months***   Medication Adjustments/Labs and Tests Ordered: Current medicines are reviewed at length with the patient today.  Concerns regarding medicines are outlined above.  No orders of the defined types were placed in this encounter.  No orders of the defined types were placed in this encounter.   There are no Patient Instructions on file for this visit.   Signed, Lonni LITTIE Nanas, MD  07/28/2024 10:03 PM    [facility] "

## 2024-07-29 ENCOUNTER — Ambulatory Visit: Admitting: Cardiology

## 2024-08-14 ENCOUNTER — Other Ambulatory Visit: Payer: Self-pay | Admitting: Internal Medicine

## 2024-08-14 ENCOUNTER — Telehealth: Payer: Self-pay

## 2024-08-14 NOTE — Telephone Encounter (Signed)
 Copied from CRM [id number]. Topic: Clinical - Medication Refill >> Aug 14, 2024 12:50 PM Chantha C wrote: TOC OSA and allergies, meds refill lov 03/03/24 Dr. Neysa. Scheduled with Dr. Pawar 10/26/24 at 3:45 pm. Patient will not have a refill after a month from now, patient has 30 days supply still; traZODone  (DESYREL ) 50 MG tablet and would like a refill until patient is seen. Informed patient, medication(s) refills may take up to 3 business days. We ask that you follow-up with your pharmacy in a week in advance to prevent any delays in medication(s) refill.  Please advise and call back/Mychart.   [id number] [address], [place] - [address] AT [facility] [address] [place] [address] Phone: [phone] Fax: [phone]   Atc x1 to schedule pt sooner appt. Dr. Theodoro please advise regarding refill

## 2024-08-14 NOTE — Telephone Encounter (Signed)
 Copied from CRM [id number]. Topic: General - Other >> Aug 14, 2024  1:46 PM Joesph PARAS wrote: Reason for CRM: Patient is returning call to [facility]. Patient is not willing to come in for an appointment in the morning. Patient declined to reschedule at this time.  ATC LVMTCB

## 2024-08-18 ENCOUNTER — Ambulatory Visit: Admitting: Diagnostic Neuroimaging

## 2024-09-18 ENCOUNTER — Ambulatory Visit: Admitting: Cardiology

## 2024-10-23 ENCOUNTER — Ambulatory Visit: Admitting: Cardiology

## 2024-10-26 ENCOUNTER — Encounter
